# Patient Record
Sex: Male | Born: 1980
Health system: Southern US, Community
[De-identification: ages and names within clinical notes are randomized; demographics above are authoritative.]

## PROBLEM LIST (undated history)

## (undated) DIAGNOSIS — I10 Essential (primary) hypertension: Secondary | ICD-10-CM

---

## 2010-03-21 ENCOUNTER — Emergency Department (HOSPITAL_COMMUNITY)
Admission: EM | Admit: 2010-03-21 | Discharge: 2010-03-21 | Payer: Self-pay | Source: Home / Self Care | Admitting: Emergency Medicine

## 2010-06-11 LAB — URINALYSIS, ROUTINE W REFLEX MICROSCOPIC
Bilirubin Urine: NEGATIVE
Glucose, UA: NEGATIVE mg/dL
Hgb urine dipstick: NEGATIVE
Ketones, ur: NEGATIVE mg/dL
Nitrite: NEGATIVE
Protein, ur: NEGATIVE mg/dL
Specific Gravity, Urine: 1.006 (ref 1.005–1.030)
Urobilinogen, UA: 0.2 mg/dL (ref 0.0–1.0)
pH: 7 (ref 5.0–8.0)

## 2010-06-11 LAB — POCT I-STAT, CHEM 8
BUN: 4 mg/dL — ABNORMAL LOW (ref 6–23)
Calcium, Ion: 1.14 mmol/L (ref 1.12–1.32)
Chloride: 103 mEq/L (ref 96–112)
Creatinine, Ser: 1.1 mg/dL (ref 0.4–1.5)
TCO2: 31 mmol/L (ref 0–100)

## 2015-05-22 ENCOUNTER — Encounter (HOSPITAL_COMMUNITY): Payer: Self-pay | Admitting: Emergency Medicine

## 2015-05-22 ENCOUNTER — Emergency Department (HOSPITAL_COMMUNITY): Payer: BLUE CROSS/BLUE SHIELD

## 2015-05-22 ENCOUNTER — Emergency Department (HOSPITAL_COMMUNITY)
Admission: EM | Admit: 2015-05-22 | Discharge: 2015-05-22 | Disposition: A | Discharge status: Home / Self Care | Payer: BLUE CROSS/BLUE SHIELD | Attending: Emergency Medicine | Admitting: Emergency Medicine

## 2015-05-22 DIAGNOSIS — B349 Viral infection, unspecified: Secondary | ICD-10-CM | POA: Insufficient documentation

## 2015-05-22 DIAGNOSIS — R197 Diarrhea, unspecified: Secondary | ICD-10-CM

## 2015-05-22 DIAGNOSIS — R05 Cough: Secondary | ICD-10-CM | POA: Diagnosis present

## 2015-05-22 DIAGNOSIS — M545 Low back pain: Secondary | ICD-10-CM | POA: Insufficient documentation

## 2015-05-22 DIAGNOSIS — Z79899 Other long term (current) drug therapy: Secondary | ICD-10-CM | POA: Insufficient documentation

## 2015-05-22 DIAGNOSIS — I10 Essential (primary) hypertension: Secondary | ICD-10-CM | POA: Insufficient documentation

## 2015-05-22 DIAGNOSIS — R1110 Vomiting, unspecified: Secondary | ICD-10-CM

## 2015-05-22 HISTORY — DX: Essential (primary) hypertension: I10

## 2015-05-22 LAB — COMPREHENSIVE METABOLIC PANEL
ALBUMIN: 3.9 g/dL (ref 3.5–5.0)
ALK PHOS: 97 U/L (ref 38–126)
ALT: 56 U/L (ref 17–63)
AST: 26 U/L (ref 15–41)
Anion gap: 13 (ref 5–15)
BUN: 12 mg/dL (ref 6–20)
CALCIUM: 9.8 mg/dL (ref 8.9–10.3)
CO2: 23 mmol/L (ref 22–32)
CREATININE: 1.43 mg/dL — AB (ref 0.61–1.24)
Chloride: 102 mmol/L (ref 101–111)
GFR calc Af Amer: >60 mL/min (ref >60)
GFR calc non Af Amer: >60 mL/min (ref >60)
GLUCOSE: 131 mg/dL — AB (ref 65–99)
Potassium: 4.2 mmol/L (ref 3.5–5.1)
SODIUM: 138 mmol/L (ref 135–145)
Total Bilirubin: 0.7 mg/dL (ref 0.3–1.2)
Total Protein: 8.1 g/dL (ref 6.5–8.1)

## 2015-05-22 LAB — CBC
HCT: 42.9 % (ref 39.0–52.0)
HEMOGLOBIN: 14.4 g/dL (ref 13.0–17.0)
MCH: 30.4 pg (ref 26.0–34.0)
MCHC: 33.6 g/dL (ref 30.0–36.0)
MCV: 90.7 fL (ref 78.0–100.0)
Platelets: 332 10*3/uL (ref 150–400)
RBC: 4.73 MIL/uL (ref 4.22–5.81)
RDW: 12.9 % (ref 11.5–15.5)
WBC: 16 10*3/uL — ABNORMAL HIGH (ref 4.0–10.5)

## 2015-05-22 LAB — URINALYSIS, ROUTINE W REFLEX MICROSCOPIC
BILIRUBIN URINE: NEGATIVE
GLUCOSE, UA: NEGATIVE mg/dL
HGB URINE DIPSTICK: NEGATIVE
Ketones, ur: NEGATIVE mg/dL
Leukocytes, UA: NEGATIVE
Nitrite: NEGATIVE
PH: 6 (ref 5.0–8.0)
Protein, ur: NEGATIVE mg/dL
SPECIFIC GRAVITY, URINE: 1.013 (ref 1.005–1.030)

## 2015-05-22 MED ORDER — ACETAMINOPHEN 325 MG PO TABS
650.0000 mg | ORAL_TABLET | Freq: Once | ORAL | Status: AC
  Administered 2015-05-22: 650 mg via ORAL

## 2015-05-22 MED ORDER — SODIUM CHLORIDE 0.9 % IV BOLUS (SEPSIS)
1000.0000 mL | Freq: Once | INTRAVENOUS | Status: AC
  Administered 2015-05-22: 1000 mL via INTRAVENOUS

## 2015-05-22 MED ORDER — ONDANSETRON 4 MG PO TBDP: 4.0000 mg | ORAL_TABLET | Freq: Three times a day (TID) | ORAL | Status: DC | PRN

## 2015-05-22 MED ORDER — ONDANSETRON HCL 4 MG/2ML IJ SOLN
4.0000 mg | Freq: Once | INTRAMUSCULAR | Status: AC
  Administered 2015-05-22: 4 mg via INTRAVENOUS
  Filled 2015-05-22: qty 2

## 2015-05-22 MED ORDER — ACETAMINOPHEN 325 MG PO TABS
ORAL_TABLET | ORAL | Status: AC
  Filled 2015-05-22: qty 2

## 2015-05-22 NOTE — ED Provider Notes (Signed)
CSN: [id number]     Arrival date & time 05/22/15  7829 History   First MD Initiated Contact with Patient 05/22/15 [phone]     Chief Complaint  Patient presents with  . Emesis  . Cough     (Consider location/radiation/quality/duration/timing/severity/associated sxs/prior Treatment) HPI Comments: 35yo M w/ h/o HTN who p/w fever and cough. Patient states that 3 days ago he began having cough associated with runny nose, chills, and subjective fever. He has had 3 episodes of vomiting over the past 24 hours. He denies any diarrhea or abdominal pain. No difficulty breathing. His only complaint of pain is mild low back pain across his lower back. No dysuria or hematuria. His wife is currently sick with a cough. No recent travel. He has been taking Tylenol and Motrin at home for his symptoms.  Patient is a 35 y.o. male presenting with vomiting and cough. The history is provided by the patient.  Emesis Cough   Past Medical History  Diagnosis Date  . Hypertension    History reviewed. No pertinent past surgical history. No family history on file. Social History  Substance Use Topics  . Smoking status: Never Smoker   . Smokeless tobacco: None  . Alcohol Use: No    Review of Systems  Respiratory: Positive for cough.   Gastrointestinal: Positive for vomiting.    10 Systems reviewed and are negative for acute change except as noted in the HPI.  Allergies  Aspirin and Novocain  Home Medications   Prior to Admission medications   Medication Sig Start Date End Date Taking? Authorizing Provider  amLODipine (NORVASC) 5 MG tablet Take 5 mg by mouth daily.   Yes Historical Provider, MD  ondansetron (ZOFRAN ODT) 4 MG disintegrating tablet Take 1 tablet (4 mg total) by mouth every 8 (eight) hours as needed for nausea or vomiting. 05/22/15   Ambrose Finland Edgardo Petrenko, MD   BP 150/101 mmHg  Pulse 94  Temp(Src) 98.6 F (37 C) (Oral)  Resp 18  Ht  (1.651 m)  Wt 159 lb (72.122 kg)  BMI 26.46  kg/m2  SpO2 98% Physical Exam  Constitutional: He is oriented to person, place, and time. He appears well-developed and well-nourished. No distress.  HENT:  Head: Normocephalic and atraumatic.  Mouth/Throat: Oropharynx is clear and moist.  Moist mucous membranes  Eyes: Conjunctivae are normal. Pupils are equal, round, and reactive to light.  Neck: Neck supple.  Cardiovascular: Normal rate, regular rhythm and normal heart sounds.   No murmur heard. Pulmonary/Chest: Effort normal and breath sounds normal.  Abdominal: Soft. Bowel sounds are normal. He exhibits no distension. There is no tenderness.  Musculoskeletal: He exhibits no edema.  Lymphadenopathy:    He has cervical adenopathy.  Neurological: He is alert and oriented to person, place, and time.  Fluent speech  Skin: Skin is warm and dry.  Psychiatric: He has a normal mood and affect. Judgment normal.  Nursing note and vitals reviewed.   ED Course  Procedures (including critical care time) Labs Review Labs Reviewed  COMPREHENSIVE METABOLIC PANEL - Abnormal; Notable for the following:    Glucose, Bld 131 (*)    Creatinine, Ser 1.43 (*)    All other components within normal limits  CBC - Abnormal; Notable for the following:    WBC 16.0 (*)    All other components within normal limits  URINALYSIS, ROUTINE W REFLEX MICROSCOPIC (NOT AT [facility])    Imaging Review Dg Chest 2 View  05/22/2015  CLINICAL DATA:  35 year old male with cough and fever x3 days. EXAM: CHEST  2 VIEW COMPARISON:  None. FINDINGS: Two views of the chest demonstrate minimal bibasilar dependent atelectatic changes. There is no focal consolidation, pleural effusion, or pneumothorax. The cardiac silhouette is within normal limits. The osseous structures appear unremarkable. IMPRESSION: No focal consolidation. Electronically Signed   By: Elgie Collard M.D.   On: 05/22/2015 09:42   I have personally reviewed and evaluated these images and lab results as part of  my medical decision-making.   EKG Interpretation None     Medications  acetaminophen (TYLENOL) tablet 650 mg (650 mg Oral Given 05/22/15 0647)  sodium chloride 0.9 % bolus 1,000 mL (0 mLs Intravenous Stopped 05/22/15 1010)  ondansetron (ZOFRAN) injection 4 mg (4 mg Intravenous Given 05/22/15 0912)    MDM   Final diagnoses:  Viral syndrome  Vomiting and diarrhea   Pt w/ 3 days of viral symptoms including cough, fever/chills, and mild low back pain. He was well-appearing on exam. Initial vital signs notable for fever of 100.8 and heart rate 108. His vital signs improved after receiving Tylenol. Normal work of breathing and no abnormal lung sounds. No abdominal tenderness on exam. Gave the patient Zofran and an IV fluid bolus and obtained above lab work. Creatinine slightly abnormal at 1.43, no baseline available for comparison. WBC 16,000. Chest x-ray clear.  Patient's symptoms consistent with a viral process. Because he is well appearing and tolerating PO, I feel he is safe for discharge. Instructed on supportive care and reviewed return precautions. Patient voiced understanding and was discharged in satisfactory condition.  Laurence Spates, MD 05/22/15 [phone]

## 2015-05-22 NOTE — ED Notes (Signed)
Patient undressed, in gown, on continuous pulse oximetry and blood pressure cuff; visitor at bedside 

## 2015-05-22 NOTE — ED Notes (Signed)
Pt drank juice and was able to keep it down

## 2015-05-22 NOTE — ED Notes (Signed)
MD at the bedside  

## 2015-05-22 NOTE — ED Notes (Signed)
Pt. reports emesis with fever , chills and runny nose onset yesterday .

## 2015-05-22 NOTE — ED Notes (Signed)
Pt given juice and verbalized no other needs at this time

## 2015-05-22 NOTE — ED Notes (Signed)
Pt returned from X-ray.  

## 2015-05-22 NOTE — ED Notes (Signed)
Patient aware of need of urine specimen; patient handed an urinal to use

## 2018-06-05 ENCOUNTER — Encounter (HOSPITAL_COMMUNITY): Payer: Self-pay

## 2018-06-05 ENCOUNTER — Ambulatory Visit (HOSPITAL_COMMUNITY)
Admission: EM | Admit: 2018-06-05 | Discharge: 2018-06-05 | Disposition: A | Discharge status: Other Acute Inpatient Hospital | Payer: Managed Care, Other (non HMO) | Attending: Family Medicine | Admitting: Family Medicine

## 2018-06-05 DIAGNOSIS — R51 Headache: Secondary | ICD-10-CM

## 2018-06-05 DIAGNOSIS — B9789 Other viral agents as the cause of diseases classified elsewhere: Secondary | ICD-10-CM

## 2018-06-05 DIAGNOSIS — R519 Headache, unspecified: Secondary | ICD-10-CM

## 2018-06-05 DIAGNOSIS — I160 Hypertensive urgency: Secondary | ICD-10-CM | POA: Diagnosis not present

## 2018-06-05 DIAGNOSIS — J069 Acute upper respiratory infection, unspecified: Secondary | ICD-10-CM

## 2018-06-05 NOTE — ED Triage Notes (Signed)
Pt presents with cold symptoms; nasal drainage and congestion; pt also has complaints of ongoing headache and elevated blood pressure.

## 2018-06-05 NOTE — ED Provider Notes (Signed)
[facility]   [id number] 06/05/18 Arrival Time: 0932  CC: HEADACHE, elevated blood pressure and cold symptoms  SUBJECTIVE:  John Savage is a 38 y.o. male hx significant for HTN, who complains of intermittent HAs for the past couple of months. Currently with HA.  Denies a precipitating event, or recent head trauma.  Associates HA to elevated blood pressure.  Checked blood pressure at [facility] the other day and was 249/100 and something. 219/133 in office. Hx of HTN x 1 year.  Was started on amlodipine at that time.  Takes medication at night time daily.  Has a PCP that follows him for high blood pressure, but has not reached out to PCP regarding uncontrolled HTN and HA symptoms.  Patient localizes his pain to the top of his head.  Describes the pain as intermittent, lasting 2-3 hours at a time, and "hot inside" in character.  Patient has tried OTC ibuprofen with temporary relief. Denies worsening factors.  Denies similar symptoms in the past.  This is the worst headache of his life.  Complains of intermittent dizziness, currently asymptomatic.  Denies lightheadedness, chest pain, shortness of breath, numbness or tingling in extremities, abdominal pain, changes in bowel or bladder habits.    Pt also mentions cold symptoms including nasal drainage, congestion, and cough x 1 weeks.  Admits to sick exposure to son with similar symptoms, son diagnosed with PNA.  Has OTC medications.  Denies similar symptoms in the past.  Denies fever, chills, body aches, sore throat.    ROS: As per HPI.  Past Medical History:  Diagnosis Date  . Hypertension    History reviewed. No pertinent surgical history. Allergies  Allergen Reactions  . Aspirin     Loose blood  . Novocain [Procaine]     unknown   No current facility-administered medications on file prior to encounter.    Current Outpatient Medications on File Prior to Encounter  Medication Sig Dispense Refill  . amLODipine (NORVASC)  5 MG tablet Take 5 mg by mouth daily.    . ondansetron (ZOFRAN ODT) 4 MG disintegrating tablet Take 1 tablet (4 mg total) by mouth every 8 (eight) hours as needed for nausea or vomiting. 8 tablet 0   Social History   Socioeconomic History  . Marital status: Married    Spouse name: Not on file  . Number of children: Not on file  . Years of education: Not on file  . Highest education level: Not on file  Occupational History  . Not on file  Social Needs  . Financial resource strain: Not on file  . Food insecurity:    Worry: Not on file    Inability: Not on file  . Transportation needs:    Medical: Not on file    Non-medical: Not on file  Tobacco Use  . Smoking status: Never Smoker  Substance and Sexual Activity  . Alcohol use: No  . Drug use: No  . Sexual activity: Not on file  Lifestyle  . Physical activity:    Days per week: Not on file    Minutes per session: Not on file  . Stress: Not on file  Relationships  . Social connections:    Talks on phone: Not on file    Gets together: Not on file    Attends religious service: Not on file    Active member of club or organization: Not on file    Attends meetings of clubs or organizations: Not on file  Relationship status: Not on file  . Intimate partner violence:    Fear of current or ex partner: Not on file    Emotionally abused: Not on file    Physically abused: Not on file    Forced sexual activity: Not on file  Other Topics Concern  . Not on file  Social History Narrative  . Not on file   Family History  Family history unknown: Yes    OBJECTIVE:  Vitals:   06/05/18 0957  BP: (!) 219/133  Pulse: (!) 105  Resp: 20  Temp: 98.9 F (37.2 C)  TempSrc: Oral  SpO2: 99%    General appearance: alert; no distress Eyes: PERRLA; EOMI HENT: normocephalic; atraumatic; EACs clear, TMs pearly gray; Nose patent without rhinorrhea; oropharynx clear Neck: supple with FROM Lungs: clear to auscultation  bilaterally Heart: soft systolic murmur.  Radial pulses 2+ symmetrical bilaterally Extremities: no edema; symmetrical with no gross deformities Skin: warm and dry Abdomen: soft, non-distended, NTTP, no guarding Neurologic: CN 2-12 grossly intact; finger to nose without difficulty; normal gait; strength and sensation intact bilaterally about the upper and lower extremities; left hand with tremor, pt states he has had tremor prior to symptoms Psychological: alert and cooperative; normal mood and affect  ASSESSMENT & PLAN:  1. Hypertensive urgency   2. Worst headache of life   3. Viral URI with cough     Blood pressure significantly elevated in office with worst headache of life.   Recommending further evaluation and management in ED.  PT aware and in agreement with this plan.    Rennis Harding, PA-C 06/05/18 1148

## 2018-06-05 NOTE — Discharge Instructions (Signed)
Blood pressure significantly elevated in office with worst headache of life.   Recommending further evaluation and management in ED.  PT aware and in agreement with this plan

## 2018-06-06 ENCOUNTER — Observation Stay (HOSPITAL_COMMUNITY)
Admission: EM | Admit: 2018-06-06 | Discharge: 2018-06-07 | Disposition: A | Discharge status: Home / Self Care | Payer: Managed Care, Other (non HMO) | Attending: Student in an Organized Health Care Education/Training Program | Admitting: Student in an Organized Health Care Education/Training Program

## 2018-06-06 ENCOUNTER — Other Ambulatory Visit: Payer: Self-pay

## 2018-06-06 ENCOUNTER — Encounter (HOSPITAL_COMMUNITY): Payer: Self-pay

## 2018-06-06 ENCOUNTER — Emergency Department (HOSPITAL_COMMUNITY): Payer: Managed Care, Other (non HMO)

## 2018-06-06 ENCOUNTER — Observation Stay (HOSPITAL_COMMUNITY): Payer: Managed Care, Other (non HMO)

## 2018-06-06 DIAGNOSIS — R51 Headache: Secondary | ICD-10-CM | POA: Insufficient documentation

## 2018-06-06 DIAGNOSIS — B9729 Other coronavirus as the cause of diseases classified elsewhere: Secondary | ICD-10-CM | POA: Insufficient documentation

## 2018-06-06 DIAGNOSIS — I701 Atherosclerosis of renal artery: Secondary | ICD-10-CM | POA: Diagnosis not present

## 2018-06-06 DIAGNOSIS — I160 Hypertensive urgency: Secondary | ICD-10-CM | POA: Diagnosis not present

## 2018-06-06 DIAGNOSIS — I10 Essential (primary) hypertension: Secondary | ICD-10-CM | POA: Diagnosis present

## 2018-06-06 DIAGNOSIS — J069 Acute upper respiratory infection, unspecified: Principal | ICD-10-CM | POA: Insufficient documentation

## 2018-06-06 DIAGNOSIS — Z79899 Other long term (current) drug therapy: Secondary | ICD-10-CM | POA: Insufficient documentation

## 2018-06-06 DIAGNOSIS — R9089 Other abnormal findings on diagnostic imaging of central nervous system: Secondary | ICD-10-CM | POA: Diagnosis present

## 2018-06-06 DIAGNOSIS — R519 Headache, unspecified: Secondary | ICD-10-CM | POA: Diagnosis present

## 2018-06-06 DIAGNOSIS — Z886 Allergy status to analgesic agent status: Secondary | ICD-10-CM | POA: Diagnosis not present

## 2018-06-06 DIAGNOSIS — R930 Abnormal findings on diagnostic imaging of skull and head, not elsewhere classified: Secondary | ICD-10-CM

## 2018-06-06 DIAGNOSIS — E876 Hypokalemia: Secondary | ICD-10-CM

## 2018-06-06 LAB — CBC
HCT: 41.5 % (ref 39.0–52.0)
Hemoglobin: 13.8 g/dL (ref 13.0–17.0)
MCH: 30.1 pg (ref 26.0–34.0)
MCHC: 33.3 g/dL (ref 30.0–36.0)
MCV: 90.6 fL (ref 80.0–100.0)
Platelets: 302 10*3/uL (ref 150–400)
RBC: 4.58 MIL/uL (ref 4.22–5.81)
RDW: 12.9 % (ref 11.5–15.5)
WBC: 6.9 10*3/uL (ref 4.0–10.5)
nRBC: 0 % (ref 0.0–0.2)

## 2018-06-06 LAB — BASIC METABOLIC PANEL
Anion gap: 11 (ref 5–15)
BUN: 12 mg/dL (ref 6–20)
CHLORIDE: 105 mmol/L (ref 98–111)
CO2: 25 mmol/L (ref 22–32)
CREATININE: 1.32 mg/dL — AB (ref 0.61–1.24)
Calcium: 9.3 mg/dL (ref 8.9–10.3)
GFR calc Af Amer: >60 mL/min (ref >60)
GFR calc non Af Amer: >60 mL/min (ref >60)
GLUCOSE: 127 mg/dL — AB (ref 70–99)
POTASSIUM: 3.1 mmol/L — AB (ref 3.5–5.1)
Sodium: 141 mmol/L (ref 135–145)

## 2018-06-06 LAB — MRSA PCR SCREENING: MRSA by PCR: NEGATIVE

## 2018-06-06 MED ORDER — LABETALOL HCL 5 MG/ML IV SOLN
40.0000 mg | Freq: Once | INTRAVENOUS | Status: AC
  Administered 2018-06-06: 40 mg via INTRAVENOUS
  Filled 2018-06-06: qty 8

## 2018-06-06 MED ORDER — ENOXAPARIN SODIUM 40 MG/0.4ML ~~LOC~~ SOLN
40.0000 mg | SUBCUTANEOUS | Status: DC
  Administered 2018-06-06: 40 mg via SUBCUTANEOUS
  Filled 2018-06-06: qty 0.4

## 2018-06-06 MED ORDER — ACETAMINOPHEN 650 MG RE SUPP: 650.0000 mg | Freq: Four times a day (QID) | RECTAL | Status: DC | PRN

## 2018-06-06 MED ORDER — ACETAMINOPHEN 325 MG PO TABS
650.0000 mg | ORAL_TABLET | Freq: Four times a day (QID) | ORAL | Status: DC | PRN
  Administered 2018-06-06 – 2018-06-07 (×2): 650 mg via ORAL
  Filled 2018-06-06 (×2): qty 2

## 2018-06-06 MED ORDER — POTASSIUM CHLORIDE CRYS ER 20 MEQ PO TBCR
40.0000 meq | EXTENDED_RELEASE_TABLET | Freq: Once | ORAL | Status: AC
  Administered 2018-06-06: 40 meq via ORAL
  Filled 2018-06-06: qty 2

## 2018-06-06 MED ORDER — SODIUM CHLORIDE 0.9% FLUSH: 3.0000 mL | Freq: Once | INTRAVENOUS | Status: DC

## 2018-06-06 MED ORDER — LABETALOL HCL 5 MG/ML IV SOLN
20.0000 mg | Freq: Once | INTRAVENOUS | Status: AC
  Administered 2018-06-06: 20 mg via INTRAVENOUS
  Filled 2018-06-06: qty 4

## 2018-06-06 MED ORDER — LISINOPRIL 20 MG PO TABS
20.0000 mg | ORAL_TABLET | Freq: Every day | ORAL | Status: DC
  Administered 2018-06-06 – 2018-06-07 (×2): 20 mg via ORAL
  Filled 2018-06-06 (×2): qty 1

## 2018-06-06 MED ORDER — HYDROCHLOROTHIAZIDE 25 MG PO TABS
25.0000 mg | ORAL_TABLET | Freq: Every day | ORAL | Status: DC
  Administered 2018-06-06 – 2018-06-07 (×2): 25 mg via ORAL
  Filled 2018-06-06 (×2): qty 1

## 2018-06-06 MED ORDER — SENNOSIDES-DOCUSATE SODIUM 8.6-50 MG PO TABS: 1.0000 | ORAL_TABLET | Freq: Every evening | ORAL | Status: DC | PRN

## 2018-06-06 NOTE — H&P (Signed)
Date: 06/06/2018               Patient Name:  John Savage MRN: [id number]  DOB: 11-Dec-1980 Age / Sex: 38 y.o., male   PCP: Patient, No Pcp Per         Medical Service: Internal Medicine Teaching Service         Attending Physician: Dr. Cathren Laine, MD    First Contact: Dr. Jaynie Bream Pager: [phone]  Second Contact: Dr. Lanelle Bal Pager: [phone]       After Hours (After 5p/  First Contact Pager: [phone]  weekends / holidays): Second Contact Pager: [phone]   Chief Complaint: headache and cough  History of Present Illness: John Savage is a 38 year old male with a history of hypertension who presents with cough and headache.  He states that he was in his usual state of health up until 2 weeks ago when he began to have a nonproductive cough, congestion, and headache.  He describes the headache as localized to the top of his head.  His pain is intermittent lasting 2 to 3 hours at a time.  He has tried over-the-counter ibuprofen with some relief of his symptoms. He currently has a dull headache. He denies decongestant use.  He has never had any of these symptoms in the past.  His only other symptom currently is shaking of both hands bilaterally.  He reports that this is a chronic issue that has been going on for several years.  He denies lightheadedness, dizziness, chest pain, shortness of breath, weakness, numbness or tingling sensation, nausea, abdominal pain, changes in bowel movements, or urinary symptoms.  He denies fevers, body aches, and sore throat.  Of note his son was diagnosed with pneumonia recently.  He was seen in the [facility] urgent care yesterday for upper respiratory symptoms and headache. He was found to have an elevated blood pressure at 219/133 and was recommended to go to the emergency department for further evaluation and management of hypertensive urgency.  He reports a one-year history of hypertension.  He is seen by [facility] in  [place] and was prescribed amlodipine which he reports taking twice daily.  His medication list however shows only amlodipine 5 mg once a day.  He does report missing doses frequently but did take it this morning.  He initially stated that he had not seen his primary care doctor over the past year because of insurance issues but later reported having his blood pressure checked in the clinic. He describes his BP being "over 240" at that time.   In the ED, he was found to have blood pressures with SBP of 200-235 and DBP of 130-150. He was given two doses of IV labetalol at 20 mg and 40 mg. His blood pressure decreased to 173/133 but has subsequently increased to 190-200/140's.   Meds:  Current Meds  Medication Sig  . amLODipine (NORVASC) 5 MG tablet Take 5 mg by mouth daily.  Marland Kitchen ibuprofen (ADVIL,MOTRIN) 200 MG tablet Take 200 mg by mouth 2 (two) times daily as needed for headache.  . Pseudoeph-Doxylamine-DM-APAP (NYQUIL PO) Take 20 mLs by mouth 2 (two) times daily as needed (cough).     Allergies: Allergies as of 06/06/2018 - Review Complete 06/06/2018  Allergen Reaction Noted  . Aspirin Other (See Comments) 05/22/2015  . Novocain [procaine] Other (See Comments) 05/22/2015   Past Medical History:  Diagnosis Date  . Hypertension     Family History: No significant family  history.  Social History: He lives with his wife and children. He worked at a call center. He denies alcohol, tobacco, or drug use.   Review of Systems: A complete ROS was negative except as per HPI.   Physical Exam: Blood pressure (!) 190/130, pulse (!) 111, temperature 98.1 F (36.7 C), temperature source Oral, resp. rate (!) 23, SpO2 96 %. Physical Exam Vitals signs and nursing note reviewed.  Constitutional:      Appearance: Normal appearance.  HENT:     Head: Normocephalic and atraumatic.     Comments: No scalp or facial tenderness.     Nose: Nose normal. No congestion or rhinorrhea.     Mouth/Throat:      Mouth: Mucous membranes are moist.     Pharynx: Oropharynx is clear.  Cardiovascular:     Rate and Rhythm: Normal rate and regular rhythm.     Pulses: Normal pulses.     Heart sounds: Normal heart sounds.  Pulmonary:     Effort: Pulmonary effort is normal. No respiratory distress.     Breath sounds: Normal breath sounds.  Neurological:     General: No focal deficit present.     Mental Status: He is alert and oriented to person, place, and time. Mental status is at baseline.     Cranial Nerves: No cranial nerve deficit.     Sensory: No sensory deficit.     Motor: No weakness.     Coordination: Coordination normal.  Psychiatric:        Mood and Affect: Mood normal.        Behavior: Behavior normal.    Labs: CBC unremarkable.  BMP significant for hypokalemia of 3.1, elevated creatinine of 1.32 (seems to be at baseline--1.43 three years ago).    EKG: personally reviewed my interpretation is left axis deviation consistent with left ventricular hypertrophy, normal intervals, no signs of acute ischemia  CXR: personally reviewed my interpretation is mild peribronchial wall thickening but otherwise unremarkable.  Head CT: IMPRESSION: 1. Subcortical white matter changes/hypoattenuation best seen in the frontal lobes toward the vertex are nonspecific. Recommend an MRI for further assessment. 2. Mild mucosal thickening in scattered ethmoid air cells. 3. No other acute abnormalities.  MRI: IMPRESSION: 1. Periventricular T2 hyperintensities are moderately advanced for age. The finding is nonspecific but can be seen in the setting of chronic microvascular ischemia, a demyelinating process such as multiple sclerosis, vasculitis, complicated migraine headaches, or as the sequelae of a prior infectious or inflammatory process. Given the patient's severe hypertension, this most likely represents small vessel disease. 2. No acute intracranial abnormality.  Assessment & Plan by  Problem: Active Problems:   Hypertensive urgency  John Savage presents with a nonproductive cough, congestion, headache, and was found to have elevated blood pressure. I believe that his symptoms are likely due to an upper respiratory infection. His blood pressure has likely been uncontrolled for the last several months (reports elevated BP in January) but there is no sign of acute end-organ damage--signifying hypertensive urgency. He was found to have periventricular hyperintensities on MRI which likely represents chronic small vessel disease.   Hypertensive Urgency: - Hold home amlodipine - Start HCTZ 25 mg QD - Start Lisinopril 20 mg QD - Continuous cardiac monitorin  Upper Respiratory Infection: Likely viral in nature. - Acetaminophen 650 mg qh6 PRN pain  Hypokalemia - Potassium chloride 40 mEq once  FEN/GI: Heart healthy diet DVT PPX: Enoxaparin subcu Code: Full  Dispo: Admit patient to Observation with expected  length of stay less than 2 midnights.  Signed: Synetta Shadow, MD 06/06/2018, 6:52 PM

## 2018-06-06 NOTE — ED Provider Notes (Signed)
[facility] EMERGENCY DEPARTMENT Provider Note   CSN: [id number] Arrival date & time: 06/06/18  1508    History   Chief Complaint Chief Complaint  Patient presents with  . Cough  . Hypertension    HPI John Savage is a 38 y.o. male.     Patient with hx htn, c/o non prod cough for past 2 weeks, and persistent/recurrent headaches for 2 months. Symptoms moderate, persistent, recurrent. No acute/abrupt change or worsening today. Went to urgent care yesterday, was told bp very high and to go to ER. Patient indicates compliant w bp meds, and indicates took his amlodipine this AM. Denies change in speech or vision. No numbness/weakness, or loss or normal functional ability. Denies chest pain or discomfort. No sob or unusual doe. +nonprod cough. +runny nose. No sore throat. No fever or chills. No night sweats or wt loss. No swelling. No nvd.   The history is provided by the patient.  Cough  Associated symptoms: headaches and rhinorrhea   Associated symptoms: no chest pain, no fever, no rash, no shortness of breath and no sore throat   Hypertension  Associated symptoms include headaches. Pertinent negatives include no chest pain, no abdominal pain and no shortness of breath.    Past Medical History:  Diagnosis Date  . Hypertension     There are no active problems to display for this patient.   History reviewed. No pertinent surgical history.      Home Medications    Prior to Admission medications   Medication Sig Start Date End Date Taking? Authorizing Provider  amLODipine (NORVASC) 5 MG tablet Take 5 mg by mouth daily.    [provider]  ondansetron (ZOFRAN ODT) 4 MG disintegrating tablet Take 1 tablet (4 mg total) by mouth every 8 (eight) hours as needed for nausea or vomiting. 05/22/15   Little, Ambrose Finland, MD    Family History Family History  Family history unknown: Yes    Social History Social History   Tobacco Use  .  Smoking status: Never Smoker  . Smokeless tobacco: Never Used  Substance Use Topics  . Alcohol use: No  . Drug use: No     Allergies   Aspirin and Novocain [procaine]   Review of Systems Review of Systems  Constitutional: Negative for fever.  HENT: Positive for rhinorrhea. Negative for sore throat.   Eyes: Negative for pain, redness and visual disturbance.  Respiratory: Positive for cough. Negative for shortness of breath.   Cardiovascular: Negative for chest pain, palpitations and leg swelling.  Gastrointestinal: Negative for abdominal pain, diarrhea and vomiting.  Endocrine: Negative for polyuria.  Genitourinary: Negative for dysuria and flank pain.  Musculoskeletal: Negative for back pain, neck pain and neck stiffness.  Skin: Negative for rash.  Neurological: Positive for headaches. Negative for syncope, speech difficulty, weakness and numbness.  Hematological: Does not bruise/bleed easily.  Psychiatric/Behavioral: Negative for confusion.     Physical Exam Updated Vital Signs BP (!) 223/136 (BP Location: Left Arm)   Pulse 100   Temp 98.1 F (36.7 C) (Oral)   Resp 16   SpO2 96%   Physical Exam Vitals signs and nursing note reviewed.  Constitutional:      Appearance: Normal appearance. He is well-developed.  HENT:     Head: Atraumatic.     Comments: No sinus or temporal tenderness.    Nose: Nose normal.     Mouth/Throat:     Mouth: Mucous membranes are moist.  Pharynx: Oropharynx is clear.  Eyes:     General: No scleral icterus.    Conjunctiva/sclera: Conjunctivae normal.     Pupils: Pupils are equal, round, and reactive to light.  Neck:     Musculoskeletal: Normal range of motion and neck supple. No neck rigidity.     Vascular: No carotid bruit.     Trachea: No tracheal deviation.     Comments: No stiffness or rigidity. Trachea midline. No tm.  Cardiovascular:     Rate and Rhythm: Normal rate and regular rhythm.     Pulses: Normal pulses.     Heart  sounds: Normal heart sounds. No murmur. No friction rub. No gallop.   Pulmonary:     Effort: Pulmonary effort is normal. No accessory muscle usage or respiratory distress.     Breath sounds: Normal breath sounds.  Abdominal:     General: Bowel sounds are normal. There is no distension.     Palpations: Abdomen is soft. There is no mass.     Tenderness: There is no abdominal tenderness. There is no guarding.     Comments: No bruits.   Genitourinary:    Comments: No cva tenderness. Musculoskeletal:        General: No swelling or tenderness.     Right lower leg: No edema.     Left lower leg: No edema.  Skin:    General: Skin is warm and dry.     Findings: No rash.  Neurological:     Mental Status: He is alert.     Comments: Alert, speech clear. Motor intact bil, stre 5/5. sens grossly intact bil. Steady gait.   Psychiatric:        Mood and Affect: Mood normal.      ED Treatments / Results  Labs (all labs ordered are listed, but only abnormal results are displayed) Results for orders placed or performed during the hospital encounter of 06/06/18  Basic metabolic panel  Result Value Ref Range   Sodium 141 135 - 145 mmol/L   Potassium 3.1 (L) 3.5 - 5.1 mmol/L   Chloride 105 98 - 111 mmol/L   CO2 25 22 - 32 mmol/L   Glucose, Bld 127 (H) 70 - 99 mg/dL   BUN 12 6 - 20 mg/dL   Creatinine, Ser 4.27 (H) 0.61 - 1.24 mg/dL   Calcium 9.3 8.9 - 06.2 mg/dL   GFR calc non Af Amer >60 >60 mL/min   GFR calc Af Amer >60 >60 mL/min   Anion gap 11 5 - 15  CBC  Result Value Ref Range   WBC 6.9 4.0 - 10.5 K/uL   RBC 4.58 4.22 - 5.81 MIL/uL   Hemoglobin 13.8 13.0 - 17.0 g/dL   HCT 37.6 28.3 - 15.1 %   MCV 90.6 80.0 - 100.0 fL   MCH 30.1 26.0 - 34.0 pg   MCHC 33.3 30.0 - 36.0 g/dL   RDW 76.1 60.7 - 37.1 %   Platelets 302 150 - 400 K/uL   nRBC 0.0 0.0 - 0.2 %    EKG EKG Interpretation  Date/Time:  Saturday June 06 2018 15:28:38 EST Ventricular Rate:  99 PR Interval:  146 QRS  Duration: 72 QT Interval:  386 QTC Calculation: 495 R Axis:   27 Text Interpretation:  Normal sinus rhythm Left ventricular hypertrophy Possible Inferior infarct , age undetermined ST & T wave abnormality, consider anterolateral ischemia Abnormal ECG Suspect LVH but TWI also concerning for ischemia No ST elevations noted No  old tracing to compare Confirmed by Shaune Pollack [phone]) on 06/06/2018 3:34:10 PM   Radiology Ct Head Wo Contrast  Result Date: 06/06/2018 CLINICAL DATA:  Headache, cough, and runny nose. Elevated blood pressure. EXAM: CT HEAD WITHOUT CONTRAST TECHNIQUE: Contiguous axial images were obtained from the base of the skull through the vertex without intravenous contrast. COMPARISON:  None. FINDINGS: Brain: No subdural, epidural, or subarachnoid hemorrhage. No acute cortical ischemia or infarct. No mass effect or midline shift. Subcortical white matter changes/hypoattenuation in the frontal lobes towards the vertex. Ventricles and sulci are unremarkable. Cerebellum brainstem, and basal cisterns are normal. Vascular: No hyperdense vessel or unexpected calcification. Skull: Normal. Negative for fracture or focal lesion. Sinuses/Orbits: Mild mucosal thickening in scattered ethmoid air cells. Paranasal sinuses, mastoid air cells, and middle ears are otherwise normal. Other: None. IMPRESSION: 1. Subcortical white matter changes/hypoattenuation best seen in the frontal lobes toward the vertex are nonspecific. Recommend an MRI for further assessment. 2. Mild mucosal thickening in scattered ethmoid air cells. 3. No other acute abnormalities. Electronically Signed   By: Gerome Sam III M.D   On: 06/06/2018 18:00    Procedures Procedures (including critical care time)  Medications Ordered in ED Medications  sodium chloride flush (NS) 0.9 % injection 3 mL (has no administration in time range)     Initial Impression / Assessment and Plan / ED Course  I have reviewed the triage vital  signs and the nursing notes.  Pertinent labs & imaging results that were available during my care of the patient were reviewed by me and considered in my medical decision making (see chart for details).  Iv ns. Labs sent. Imaging ordered.   Reviewed nursing notes and prior charts for additional history.   Labs reviewed - renal fxn c/w baseline, k normal.   Recheck bp - remains high. Labetalol iv.   CT reviewed - no acute hem.  See radiology interpret - may need non emergent mri as f/u.   bp sl improved. Will given additional dose.   Given severe, uncontrolled htn, will admit.   Medical team consulted for admission.     Final Clinical Impressions(s) / ED Diagnoses   Final diagnoses:  None    ED Discharge Orders    None       Cathren Laine, MD 06/06/18 [phone]

## 2018-06-06 NOTE — ED Notes (Signed)
Pt transported to CT ?

## 2018-06-06 NOTE — ED Triage Notes (Signed)
Pt states he has had headache, cough, runny nose. States was sent here from [facility]. Elevated BP has been taking nyquil.

## 2018-06-06 NOTE — ED Notes (Signed)
Patient currently at MRI.  Report given to the floor.  To take up when he arrives back.

## 2018-06-07 ENCOUNTER — Observation Stay (HOSPITAL_BASED_OUTPATIENT_CLINIC_OR_DEPARTMENT_OTHER): Payer: Managed Care, Other (non HMO)

## 2018-06-07 ENCOUNTER — Other Ambulatory Visit: Payer: Self-pay

## 2018-06-07 DIAGNOSIS — Z884 Allergy status to anesthetic agent status: Secondary | ICD-10-CM

## 2018-06-07 DIAGNOSIS — R51 Headache: Secondary | ICD-10-CM

## 2018-06-07 DIAGNOSIS — J069 Acute upper respiratory infection, unspecified: Secondary | ICD-10-CM | POA: Diagnosis not present

## 2018-06-07 DIAGNOSIS — I160 Hypertensive urgency: Secondary | ICD-10-CM

## 2018-06-07 DIAGNOSIS — Z79899 Other long term (current) drug therapy: Secondary | ICD-10-CM

## 2018-06-07 DIAGNOSIS — I10 Essential (primary) hypertension: Secondary | ICD-10-CM

## 2018-06-07 DIAGNOSIS — R9089 Other abnormal findings on diagnostic imaging of central nervous system: Secondary | ICD-10-CM | POA: Diagnosis present

## 2018-06-07 DIAGNOSIS — Z886 Allergy status to analgesic agent status: Secondary | ICD-10-CM | POA: Diagnosis not present

## 2018-06-07 DIAGNOSIS — R519 Headache, unspecified: Secondary | ICD-10-CM | POA: Diagnosis present

## 2018-06-07 LAB — HIV ANTIBODY (ROUTINE TESTING W REFLEX): HIV SCREEN 4TH GENERATION: NONREACTIVE

## 2018-06-07 LAB — RESPIRATORY PANEL BY PCR
Adenovirus: NOT DETECTED
Bordetella pertussis: NOT DETECTED
CORONAVIRUS OC43-RVPPCR: NOT DETECTED
Chlamydophila pneumoniae: NOT DETECTED
Coronavirus 229E: NOT DETECTED
Coronavirus HKU1: NOT DETECTED
Coronavirus NL63: DETECTED — AB
Influenza A: NOT DETECTED
Influenza B: NOT DETECTED
Metapneumovirus: NOT DETECTED
Mycoplasma pneumoniae: NOT DETECTED
PARAINFLUENZA VIRUS 2-RVPPCR: NOT DETECTED
Parainfluenza Virus 1: NOT DETECTED
Parainfluenza Virus 3: NOT DETECTED
Parainfluenza Virus 4: NOT DETECTED
Respiratory Syncytial Virus: NOT DETECTED
Rhinovirus / Enterovirus: NOT DETECTED

## 2018-06-07 MED ORDER — HYDROCHLOROTHIAZIDE 25 MG PO TABS: 25.0000 mg | ORAL_TABLET | Freq: Every day | ORAL | 2 refills | Status: DC

## 2018-06-07 MED ORDER — LISINOPRIL 20 MG PO TABS: 20.0000 mg | ORAL_TABLET | Freq: Every day | ORAL | 2 refills | Status: DC

## 2018-06-07 NOTE — Plan of Care (Signed)
  Problem: Education: Goal: Knowledge of General Education information will improve Description Including pain rating scale, medication(s)/side effects and non-pharmacologic comfort measures 06/07/2018 1526 by Don Perking, RN Outcome: Completed/Met 06/07/2018 1057 by Don Perking, RN Outcome: Progressing   Problem: Health Behavior/Discharge Planning: Goal: Ability to manage health-related needs will improve 06/07/2018 1526 by Don Perking, RN Outcome: Completed/Met 06/07/2018 1057 by Don Perking, RN Outcome: Progressing   Problem: Clinical Measurements: Goal: Ability to maintain clinical measurements within normal limits will improve 06/07/2018 1526 by Don Perking, RN Outcome: Completed/Met 06/07/2018 1057 by Don Perking, RN Outcome: Progressing Goal: Will remain free from infection 06/07/2018 1526 by Don Perking, RN Outcome: Completed/Met 06/07/2018 1057 by Don Perking, RN Outcome: Progressing Goal: Diagnostic test results will improve 06/07/2018 1526 by Don Perking, RN Outcome: Completed/Met 06/07/2018 1057 by Don Perking, RN Outcome: Progressing Goal: Respiratory complications will improve 06/07/2018 1526 by Don Perking, RN Outcome: Completed/Met 06/07/2018 1057 by Don Perking, RN Outcome: Progressing Goal: Cardiovascular complication will be avoided 06/07/2018 1526 by Don Perking, RN Outcome: Completed/Met 06/07/2018 1057 by Don Perking, RN Outcome: Progressing   Problem: Activity: Goal: Risk for activity intolerance will decrease 06/07/2018 1526 by Don Perking, RN Outcome: Completed/Met 06/07/2018 1057 by Don Perking, RN Outcome: Progressing   Problem: Nutrition: Goal: Adequate nutrition will be maintained 06/07/2018 1526 by Don Perking, RN Outcome: Completed/Met 06/07/2018 1057 by Don Perking, RN Outcome: Progressing   Problem: Coping: Goal: Level of anxiety will  decrease 06/07/2018 1526 by Don Perking, RN Outcome: Completed/Met 06/07/2018 1057 by Don Perking, RN Outcome: Progressing   Problem: Elimination: Goal: Will not experience complications related to bowel motility 06/07/2018 1526 by Don Perking, RN Outcome: Completed/Met 06/07/2018 1057 by Don Perking, RN Outcome: Progressing Goal: Will not experience complications related to urinary retention 06/07/2018 1526 by Don Perking, RN Outcome: Completed/Met 06/07/2018 1057 by Don Perking, RN Outcome: Progressing   Problem: Pain Managment: Goal: General experience of comfort will improve 06/07/2018 1526 by Don Perking, RN Outcome: Completed/Met 06/07/2018 1057 by Don Perking, RN Outcome: Progressing   Problem: Safety: Goal: Ability to remain free from injury will improve 06/07/2018 1526 by Don Perking, RN Outcome: Completed/Met 06/07/2018 1057 by Don Perking, RN Outcome: Progressing   Problem: Skin Integrity: Goal: Risk for impaired skin integrity will decrease 06/07/2018 1526 by Don Perking, RN Outcome: Completed/Met 06/07/2018 1057 by Don Perking, RN Outcome: Progressing

## 2018-06-07 NOTE — Progress Notes (Signed)
Notified MD oncall regarding BP, no new orders rec'd will continue to monitor.

## 2018-06-07 NOTE — Discharge Instructions (Signed)
Hypertension Hypertension, commonly called high blood pressure, is when the force of blood pumping through the arteries is too strong. The arteries are the blood vessels that carry blood from the heart throughout the body. Hypertension forces the heart to work harder to pump blood and may cause arteries to become narrow or stiff. Having untreated or uncontrolled hypertension can cause heart attacks, strokes, kidney disease, and other problems. A blood pressure reading consists of a higher number over a lower number. Ideally, your blood pressure should be below 120/80. The first ("top") number is called the systolic pressure. It is a measure of the pressure in your arteries as your heart beats. The second ("bottom") number is called the diastolic pressure. It is a measure of the pressure in your arteries as the heart relaxes. What are the causes? The cause of this condition is not known. What increases the risk? Some risk factors for high blood pressure are under your control. Others are not. Factors you can change  Smoking.  Having type 2 diabetes mellitus, high cholesterol, or both.  Not getting enough exercise or physical activity.  Being overweight.  Having too much fat, sugar, calories, or salt (sodium) in your diet.  Drinking too much alcohol. Factors that are difficult or impossible to change  Having chronic kidney disease.  Having a family history of high blood pressure.  Age. Risk increases with age.  Race. You may be at higher risk if you are African-American.  Gender. Men are at higher risk than women before age 53. After age 65, women are at higher risk than men.  Having obstructive sleep apnea.  Stress. What are the signs or symptoms? Extremely high blood pressure (hypertensive crisis) may cause:  Headache.  Anxiety.  Shortness of breath.  Nosebleed.  Nausea and vomiting.  Severe chest pain.  Jerky movements you cannot control (seizures). How is this  diagnosed? This condition is diagnosed by measuring your blood pressure while you are seated, with your arm resting on a surface. The cuff of the blood pressure monitor will be placed directly against the skin of your upper arm at the level of your heart. It should be measured at least twice using the same arm. Certain conditions can cause a difference in blood pressure between your right and left arms. Certain factors can cause blood pressure readings to be lower or higher than normal (elevated) for a short period of time:  When your blood pressure is higher when you are in a health care provider's office than when you are at home, this is called white coat hypertension. Most people with this condition do not need medicines.  When your blood pressure is higher at home than when you are in a health care provider's office, this is called masked hypertension. Most people with this condition may need medicines to control blood pressure. If you have a high blood pressure reading during one visit or you have normal blood pressure with other risk factors:  You may be asked to return on a different day to have your blood pressure checked again.  You may be asked to monitor your blood pressure at home for 1 week or longer. If you are diagnosed with hypertension, you may have other blood or imaging tests to help your health care provider understand your overall risk for other conditions. How is this treated? This condition is treated by making healthy lifestyle changes, such as eating healthy foods, exercising more, and reducing your alcohol intake. Your health care provider  may prescribe medicine if lifestyle changes are not enough to get your blood pressure under control, and if:  Your systolic blood pressure is above 130.  Your diastolic blood pressure is above 80. Your personal target blood pressure may vary depending on your medical conditions, your age, and other factors. Follow these instructions  at home: Eating and drinking   Eat a diet that is high in fiber and potassium, and low in sodium, added sugar, and fat. An example eating plan is called the DASH (Dietary Approaches to Stop Hypertension) diet. To eat this way: ? Eat plenty of fresh fruits and vegetables. Try to fill half of your plate at each meal with fruits and vegetables. ? Eat whole grains, such as whole wheat pasta, brown rice, or whole grain bread. Fill about one quarter of your plate with whole grains. ? Eat or drink low-fat dairy products, such as skim milk or low-fat yogurt. ? Avoid fatty cuts of meat, processed or cured meats, and poultry with skin. Fill about one quarter of your plate with lean proteins, such as fish, chicken without skin, beans, eggs, and tofu. ? Avoid premade and processed foods. These tend to be higher in sodium, added sugar, and fat.  Reduce your daily sodium intake. Most people with hypertension should eat less than 1,500 mg of sodium a day.  Limit alcohol intake to no more than 1 drink a day for nonpregnant women and 2 drinks a day for men. One drink equals 12 oz of beer, 5 oz of wine, or 1 oz of hard liquor. Lifestyle   Work with your health care provider to maintain a healthy body weight or to lose weight. Ask what an ideal weight is for you.  Get at least 30 minutes of exercise that causes your heart to beat faster (aerobic exercise) most days of the week. Activities may include walking, swimming, or biking.  Include exercise to strengthen your muscles (resistance exercise), such as pilates or lifting weights, as part of your weekly exercise routine. Try to do these types of exercises for 30 minutes at least 3 days a week.  Do not use any products that contain nicotine or tobacco, such as cigarettes and e-cigarettes. If you need help quitting, ask your health care provider.  Monitor your blood pressure at home as told by your health care provider.  Keep all follow-up visits as told by  your health care provider. This is important. Medicines  Take over-the-counter and prescription medicines only as told by your health care provider. Follow directions carefully. Blood pressure medicines must be taken as prescribed.  Do not skip doses of blood pressure medicine. Doing this puts you at risk for problems and can make the medicine less effective.  Ask your health care provider about side effects or reactions to medicines that you should watch for. Contact a health care provider if:  You think you are having a reaction to a medicine you are taking.  You have headaches that keep coming back (recurring).  You feel dizzy.  You have swelling in your ankles.  You have trouble with your vision. Get help right away if:  You develop a severe headache or confusion.  You have unusual weakness or numbness.  You feel faint.  You have severe pain in your chest or abdomen.  You vomit repeatedly.  You have trouble breathing. Summary  Hypertension is when the force of blood pumping through your arteries is too strong. If this condition is not controlled, it  may put you at risk for serious complications.  Your personal target blood pressure may vary depending on your medical conditions, your age, and other factors. For most people, a normal blood pressure is less than 120/80.  Hypertension is treated with lifestyle changes, medicines, or a combination of both. Lifestyle changes include weight loss, eating a healthy, low-sodium diet, exercising more, and limiting alcohol. This information is not intended to replace advice given to you by your health care provider. Make sure you discuss any questions you have with your health care provider. Document Released: 03/18/2005 Document Revised: 02/14/2016 Document Reviewed: 02/14/2016 Elsevier Interactive Patient Education  2019 [employer].   Managing Your Hypertension Hypertension is commonly called high blood pressure. This is  when the force of your blood pressing against the walls of your arteries is too strong. Arteries are blood vessels that carry blood from your heart throughout your body. Hypertension forces the heart to work harder to pump blood, and may cause the arteries to become narrow or stiff. Having untreated or uncontrolled hypertension can cause heart attack, stroke, kidney disease, and other problems. What are blood pressure readings? A blood pressure reading consists of a higher number over a lower number. Ideally, your blood pressure should be below 120/80. The first ("top") number is called the systolic pressure. It is a measure of the pressure in your arteries as your heart beats. The second ("bottom") number is called the diastolic pressure. It is a measure of the pressure in your arteries as the heart relaxes. What does my blood pressure reading mean? Blood pressure is classified into four stages. Based on your blood pressure reading, your health care provider may use the following stages to determine what type of treatment you need, if any. Systolic pressure and diastolic pressure are measured in a unit called mm Hg. Normal  Systolic pressure: below 120.  Diastolic pressure: below 80. Elevated  Systolic pressure: 120-129.  Diastolic pressure: below 80. Hypertension stage 1  Systolic pressure: 130-139.  Diastolic pressure: 80-89. Hypertension stage 2  Systolic pressure: 140 or above.  Diastolic pressure: 90 or above. What health risks are associated with hypertension? Managing your hypertension is an important responsibility. Uncontrolled hypertension can lead to:  A heart attack.  A stroke.  A weakened blood vessel (aneurysm).  Heart failure.  Kidney damage.  Eye damage.  Metabolic syndrome.  Memory and concentration problems. What changes can I make to manage my hypertension? Hypertension can be managed by making lifestyle changes and possibly by taking medicines. Your  health care provider will help you make a plan to bring your blood pressure within a normal range. Eating and drinking   Eat a diet that is high in fiber and potassium, and low in salt (sodium), added sugar, and fat. An example eating plan is called the DASH (Dietary Approaches to Stop Hypertension) diet. To eat this way: ? Eat plenty of fresh fruits and vegetables. Try to fill half of your plate at each meal with fruits and vegetables. ? Eat whole grains, such as whole wheat pasta, brown rice, or whole grain bread. Fill about one quarter of your plate with whole grains. ? Eat low-fat diary products. ? Avoid fatty cuts of meat, processed or cured meats, and poultry with skin. Fill about one quarter of your plate with lean proteins such as fish, chicken without skin, beans, eggs, and tofu. ? Avoid premade and processed foods. These tend to be higher in sodium, added sugar, and fat.  Reduce your  daily sodium intake. Most people with hypertension should eat less than 1,500 mg of sodium a day.  Limit alcohol intake to no more than 1 drink a day for nonpregnant women and 2 drinks a day for men. One drink equals 12 oz of beer, 5 oz of wine, or 1 oz of hard liquor. Lifestyle  Work with your health care provider to maintain a healthy body weight, or to lose weight. Ask what an ideal weight is for you.  Get at least 30 minutes of exercise that causes your heart to beat faster (aerobic exercise) most days of the week. Activities may include walking, swimming, or biking.  Include exercise to strengthen your muscles (resistance exercise), such as weight lifting, as part of your weekly exercise routine. Try to do these types of exercises for 30 minutes at least 3 days a week.  Do not use any products that contain nicotine or tobacco, such as cigarettes and e-cigarettes. If you need help quitting, ask your health care provider.  Control any long-term (chronic) conditions you have, such as high cholesterol  or diabetes. Monitoring  Monitor your blood pressure at home as told by your health care provider. Your personal target blood pressure may vary depending on your medical conditions, your age, and other factors.  Have your blood pressure checked regularly, as often as told by your health care provider. Working with your health care provider  Review all the medicines you take with your health care provider because there may be side effects or interactions.  Talk with your health care provider about your diet, exercise habits, and other lifestyle factors that may be contributing to hypertension.  Visit your health care provider regularly. Your health care provider can help you create and adjust your plan for managing hypertension. Will I need medicine to control my blood pressure? Your health care provider may prescribe medicine if lifestyle changes are not enough to get your blood pressure under control, and if:  Your systolic blood pressure is 130 or higher.  Your diastolic blood pressure is 80 or higher. Take medicines only as told by your health care provider. Follow the directions carefully. Blood pressure medicines must be taken as prescribed. The medicine does not work as well when you skip doses. Skipping doses also puts you at risk for problems. Contact a health care provider if:  You think you are having a reaction to medicines you have taken.  You have repeated (recurrent) headaches.  You feel dizzy.  You have swelling in your ankles.  You have trouble with your vision. Get help right away if:  You develop a severe headache or confusion.  You have unusual weakness or numbness, or you feel faint.  You have severe pain in your chest or abdomen.  You vomit repeatedly.  You have trouble breathing. Summary  Hypertension is when the force of blood pumping through your arteries is too strong. If this condition is not controlled, it may put you at risk for serious  complications.  Your personal target blood pressure may vary depending on your medical conditions, your age, and other factors. For most people, a normal blood pressure is less than 120/80.  Hypertension is managed by lifestyle changes, medicines, or both. Lifestyle changes include weight loss, eating a healthy, low-sodium diet, exercising more, and limiting alcohol. This information is not intended to replace advice given to you by your health care provider. Make sure you discuss any questions you have with your health care provider. Document Released:  12/11/2011 Document Revised: 02/14/2016 Document Reviewed: 02/14/2016 Elsevier Interactive Patient Education  [employer].

## 2018-06-07 NOTE — Discharge Summary (Signed)
Name: John Savage MRN: [id number] DOB: [birth year] 38 y.o. PCP: Patient, No Pcp Per  Date of Admission: 06/06/2018  3:15 PM Date of Discharge: 06/07/2018 Attending Physician: John Savage, *   Discharge Diagnosis: 1. Upper Respiratory Infection 2/2 non CoVD 19 coronavirus (NL63) 2. Severe Hypertension  Discharge Medications: Allergies as of 06/07/2018      Reactions   Aspirin Other (See Comments)   Loose blood   Novocain [procaine] Other (See Comments)   unknown      Medication List    STOP taking these medications   amLODipine 5 MG tablet Commonly known as:  NORVASC   ibuprofen 200 MG tablet Commonly known as:  ADVIL,MOTRIN   NYQUIL PO   ondansetron 4 MG disintegrating tablet Commonly known as:  Zofran ODT     TAKE these medications   hydrochlorothiazide 25 MG tablet Commonly known as:  HYDRODIURIL Take 1 tablet (25 mg total) by mouth daily.   lisinopril 20 MG tablet Commonly known as:  PRINIVIL,ZESTRIL Take 1 tablet (20 mg total) by mouth daily.       Disposition and follow-up:   John Savage was discharged from [facility] in Good condition.  At the hospital follow up visit please address:  1.A  Severe HTN: BP on admission 230's systolic. Started on lisinopril and HCTZ with improvement to 160's systolic. Would recommend slow titration of antihypertensives to control BP. May need to consider resuming Amlodipine if her has persistent hypokalemia on HCTZ  1.B URI-symptoms of cough, headache, rhinorrhea mild, RVP + only for Coronavirus NL63. No indication to test CoVD 19  2.  Labs / imaging needed at time of follow-up: BMP (Mild hypokalemia)  3.  Pending labs/ test needing follow-up: Aldo/Renin  Follow-up Appointments: Follow-up Information    [facility] [place], [place]. Schedule an appointment as soon as possible for a visit.   Why:  Call and schedule a hospital discharge follow-up          Hospital  Course by problem list: 1. Severe HTN: John Savage is a 38 yo M who presented to the ED for viral URI symptoms and severe HTN. He has a PMHx of HTN for which he was taking Amlodipine daily. BP responded appropriately to initiation of a combination of thiazide/ACEi therapy but became moderately hypokalemic with repletion completed. BP improved to 160's systolic prior to discharge.   2. Viral URI symptoms:  Of note, due to his unexplained viral like illness a viral panel was ordered to better determine treatment. RVP revealed NL63 coronavirus. No further testing and supportive care is advised.  Discharge Vitals:   BP (!) 171/119 (BP Location: Left Arm)   Pulse 83   Temp 98.3 F (36.8 C) (Oral)   Resp (!) 27   Ht 5\' 5"  (1.651 m)   Wt 77.4 kg   SpO2 93%   BMI 28.40 kg/m   Pertinent Labs, Studies, and Procedures:  BMP Latest Ref Rng & Units 06/06/2018 05/22/2015 03/21/2010  Glucose 70 - 99 mg/dL 060(R) 561(B) 97  BUN 6 - 20 mg/dL 12 12 4(L)  Creatinine 0.61 - 1.24 mg/dL 3.79(K) 3.27(M) 1.1  Sodium 135 - 145 mmol/L 141 138 142  Potassium 3.5 - 5.1 mmol/L 3.1(L) 4.2 3.5  Chloride 98 - 111 mmol/L 105 102 103  CO2 22 - 32 mmol/L 25 23 -  Calcium 8.9 - 10.3 mg/dL 9.3 9.8 -    Discharge Instructions: Discharge Instructions    Diet - low sodium heart  healthy   Complete by:  As directed    Increase activity slowly   Complete by:  As directed       Signed: Lanelle Bal, MD 06/08/2018, 10:42 AM   Pager: Pager# [phone]

## 2018-06-07 NOTE — Progress Notes (Signed)
   Subjective: The patient was lying in his bed today upon entering the room. He continues to state that he has experienced upper respiratory infection like symptoms including headache, runny nose, mild unproductive cough in addition to his severe HTN. He noted being started on amlodipine recently but did not recall his past blood pressures well.   Objective:  Vital signs in last 24 hours: Vitals:   06/07/18 0700 06/07/18 0800 06/07/18 1000 06/07/18 1135  BP: (!) 141/94 (!) 134/96 (!) 176/121 (!) 165/113  Pulse: 98 98  98  Resp: (!) 22 (!) 22  20  Temp: 99 F (37.2 C)   98.8 F (37.1 C)  TempSrc: Oral   Oral  SpO2: 96% 97% 96% 92%  Weight:      Height:       General: A/O x4, in no acute distress, afebrile, nondiaphoretic Cardio: RRR, no mrg's  Pulmonary: CTA bilaterally, no wheezing or crackles  MSK: BLE nontender, nonedematous Psych: Appropriate affect, not depressed in appearance, engages well  Assessment/Plan:  Principal Problem:   Severe hypertension Active Problems:   Acute upper respiratory infection   Headache   Abnormal brain MRI  Assessment: Wissam Giglio is a 38 yo M who presented to the ED for viral URI symptoms and severe HTN. He has a PMHx of HTN for which he was taking Amlodipine daily. BP responded appropriately to initiation of a combo thiazide/ACEi therapy but became moderately hypokalemic. Will need to be monitored when discharged. Of note, due to his unexplained viral like illness a viral panel was ordered to better determine treatment.   Severe HTN: We will cautiously initiate antihypertensive therapy with the combo HCTZ-lisinopril tablet. Aggressive inpatient blood pressure regulation has been shown to be harmful and as such this process should be slowly performed as an outpatient over the next several weeks. A goal of ~160 systolic is acceptable at discharge with the eventual target of >140/90.  Continue HCTZ-Lisinopril BMP in one week with  PCP Ordered renal artery duplex, results of 1-59% are less than the typical >70% expected to induce symptoms Renin-aldo pending-can be followed up as an outpatient  Viral URI: Ordered RVP panel, will follow. Continue Tylenol. Monitor vitals  Diet: Heart Healthy Code: Full VTE PPX: enoxaparin Fluids: N/A Dispo: Anticipated discharge in approximately 0-1 day(s).   Lanelle Bal, MD 06/07/2018, 12:01 PM Pager# [phone]

## 2018-06-07 NOTE — Progress Notes (Signed)
Renal artery duplex completed.  Refer to "CV Proc" under chart review to view preliminary results.  06/07/2018 9:47 AM Gertie Fey, MHA, RVT, RDCS, RDMS

## 2018-06-07 NOTE — Progress Notes (Signed)
Paged Dr. Oswaldo Done regarding blood pressures still 200/130s.  Instructed to continue to monitor patient and page back if new symptoms arise.  Will continue to monitor.

## 2018-06-07 NOTE — Plan of Care (Signed)

## 2018-06-10 LAB — ALDOSTERONE + RENIN ACTIVITY W/ RATIO
ALDO / PRA Ratio: 18.2 (ref 0.0–30.0)
Aldosterone: 7.6 ng/dL (ref 0.0–30.0)
PRA LC/MS/MS: 0.417 ng/mL/hr (ref 0.167–5.380)

## 2018-06-23 ENCOUNTER — Ambulatory Visit (INDEPENDENT_AMBULATORY_CARE_PROVIDER_SITE_OTHER): Payer: Managed Care, Other (non HMO) | Admitting: Family Medicine

## 2018-06-23 ENCOUNTER — Other Ambulatory Visit: Payer: Self-pay

## 2018-06-23 ENCOUNTER — Encounter: Payer: Self-pay | Admitting: Family Medicine

## 2018-06-23 VITALS — BP 199/146 | HR 85 | Temp 98.3°F | Resp 17 | Ht 64.0 in | Wt 169.6 lb

## 2018-06-23 DIAGNOSIS — I10 Essential (primary) hypertension: Secondary | ICD-10-CM

## 2018-06-23 DIAGNOSIS — R9089 Other abnormal findings on diagnostic imaging of central nervous system: Secondary | ICD-10-CM

## 2018-06-23 DIAGNOSIS — R930 Abnormal findings on diagnostic imaging of skull and head, not elsewhere classified: Secondary | ICD-10-CM

## 2018-06-23 DIAGNOSIS — Z7689 Persons encountering health services in other specified circumstances: Secondary | ICD-10-CM

## 2018-06-23 DIAGNOSIS — Z131 Encounter for screening for diabetes mellitus: Secondary | ICD-10-CM

## 2018-06-23 DIAGNOSIS — Z1389 Encounter for screening for other disorder: Secondary | ICD-10-CM | POA: Diagnosis not present

## 2018-06-23 DIAGNOSIS — I499 Cardiac arrhythmia, unspecified: Secondary | ICD-10-CM

## 2018-06-23 DIAGNOSIS — R9431 Abnormal electrocardiogram [ECG] [EKG]: Secondary | ICD-10-CM | POA: Diagnosis not present

## 2018-06-23 LAB — POCT URINALYSIS DIP (CLINITEK)
Bilirubin, UA: NEGATIVE
Glucose, UA: NEGATIVE mg/dL
Ketones, POC UA: NEGATIVE mg/dL
Leukocytes, UA: NEGATIVE
Nitrite, UA: NEGATIVE
SPEC GRAV UA: 1.015 (ref 1.010–1.025)
Urobilinogen, UA: 0.2 E.U./dL
pH, UA: 7 (ref 5.0–8.0)

## 2018-06-23 MED ORDER — CARVEDILOL 3.125 MG PO TABS: 3.1250 mg | ORAL_TABLET | Freq: Two times a day (BID) | ORAL | 3 refills | Status: DC

## 2018-06-23 MED ORDER — CLONIDINE HCL 0.2 MG PO TABS
0.2000 mg | ORAL_TABLET | ORAL | Status: AC
  Administered 2018-06-23: 0.2 mg via ORAL

## 2018-06-23 MED ORDER — HYDROCHLOROTHIAZIDE 25 MG PO TABS: 25.0000 mg | ORAL_TABLET | Freq: Every day | ORAL | 2 refills | Status: DC

## 2018-06-23 MED ORDER — CLONIDINE HCL 0.1 MG PO TABS
0.1000 mg | ORAL_TABLET | Freq: Once | ORAL | Status: AC
  Administered 2018-06-23: 0.1 mg via ORAL

## 2018-06-23 MED ORDER — LISINOPRIL 20 MG PO TABS: 20.0000 mg | ORAL_TABLET | Freq: Every day | ORAL | 2 refills | Status: DC

## 2018-06-23 MED ORDER — AMLODIPINE BESYLATE 5 MG PO TABS: 5.0000 mg | ORAL_TABLET | Freq: Every day | ORAL | 3 refills | Status: DC

## 2018-06-23 NOTE — Patient Instructions (Addendum)
Thank you for choosing Primary Care at [facility] to be your medical home!    John Savage was seen by Joaquin Courts, FNP today.   John Savage's primary care provider is Patient, No Pcp Per.   For the best care possible, you should try to see Joaquin Courts, FNP-C whenever you come to the clinic.   We look forward to seeing you again soon!  If you have any questions about your visit today, please call us at [phone] or feel free to reach your primary care provider via MyChart.     Pick-up medication today and take one dose of each medication today.  Tomorrow begin taking medication as prescribed, Lisinopril and hydrochlorothiazide in the mornings and Amlodipine and carvedilol in the evening prior to bedtime.   Carvedilol is schedule twice daily, therefore take as directed.  I encourage you to purchase a blood pressure cuff to monitor your blood pressure at home. A goal blood pressure for you, should be less than 140/90.    Hypertension Hypertension, commonly called high blood pressure, is when the force of blood pumping through the arteries is too strong. The arteries are the blood vessels that carry blood from the heart throughout the body. Hypertension forces the heart to work harder to pump blood and may cause arteries to become narrow or stiff. Having untreated or uncontrolled hypertension can cause heart attacks, strokes, kidney disease, and other problems. A blood pressure reading consists of a higher number over a lower number. Ideally, your blood pressure should be below 120/80. The first ("top") number is called the systolic pressure. It is a measure of the pressure in your arteries as your heart beats. The second ("bottom") number is called the diastolic pressure. It is a measure of the pressure in your arteries as the heart relaxes. What are the causes? The cause of this condition is not known. What increases the risk? Some risk factors for high  blood pressure are under your control. Others are not. Factors you can change  Smoking.  Having type 2 diabetes mellitus, high cholesterol, or both.  Not getting enough exercise or physical activity.  Being overweight.  Having too much fat, sugar, calories, or salt (sodium) in your diet.  Drinking too much alcohol. Factors that are difficult or impossible to change  Having chronic kidney disease.  Having a family history of high blood pressure.  Age. Risk increases with age.  Race. You may be at higher risk if you are African-American.  Gender. Men are at higher risk than women before age 30. After age 21, women are at higher risk than men.  Having obstructive sleep apnea.  Stress. What are the signs or symptoms? Extremely high blood pressure (hypertensive crisis) may cause:  Headache.  Anxiety.  Shortness of breath.  Nosebleed.  Nausea and vomiting.  Severe chest pain.  Jerky movements you cannot control (seizures). How is this diagnosed? This condition is diagnosed by measuring your blood pressure while you are seated, with your arm resting on a surface. The cuff of the blood pressure monitor will be placed directly against the skin of your upper arm at the level of your heart. It should be measured at least twice using the same arm. Certain conditions can cause a difference in blood pressure between your right and left arms. Certain factors can cause blood pressure readings to be lower or higher than normal (elevated) for a short period of time:  When your blood pressure is higher when you are  in a health care provider's office than when you are at home, this is called white coat hypertension. Most people with this condition do not need medicines.  When your blood pressure is higher at home than when you are in a health care provider's office, this is called masked hypertension. Most people with this condition may need medicines to control blood pressure. If you  have a high blood pressure reading during one visit or you have normal blood pressure with other risk factors:  You may be asked to return on a different day to have your blood pressure checked again.  You may be asked to monitor your blood pressure at home for 1 week or longer. If you are diagnosed with hypertension, you may have other blood or imaging tests to help your health care provider understand your overall risk for other conditions. How is this treated? This condition is treated by making healthy lifestyle changes, such as eating healthy foods, exercising more, and reducing your alcohol intake. Your health care provider may prescribe medicine if lifestyle changes are not enough to get your blood pressure under control, and if:  Your systolic blood pressure is above 130.  Your diastolic blood pressure is above 80. Your personal target blood pressure may vary depending on your medical conditions, your age, and other factors. Follow these instructions at home: Eating and drinking   Eat a diet that is high in fiber and potassium, and low in sodium, added sugar, and fat. An example eating plan is called the DASH (Dietary Approaches to Stop Hypertension) diet. To eat this way: ? Eat plenty of fresh fruits and vegetables. Try to fill half of your plate at each meal with fruits and vegetables. ? Eat whole grains, such as whole wheat pasta, brown rice, or whole grain bread. Fill about one quarter of your plate with whole grains. ? Eat or drink low-fat dairy products, such as skim milk or low-fat yogurt. ? Avoid fatty cuts of meat, processed or cured meats, and poultry with skin. Fill about one quarter of your plate with lean proteins, such as fish, chicken without skin, beans, eggs, and tofu. ? Avoid premade and processed foods. These tend to be higher in sodium, added sugar, and fat.  Reduce your daily sodium intake. Most people with hypertension should eat less than 1,500 mg of sodium a  day.  Limit alcohol intake to no more than 1 drink a day for nonpregnant women and 2 drinks a day for men. One drink equals 12 oz of beer, 5 oz of wine, or 1 oz of hard liquor. Lifestyle   Work with your health care provider to maintain a healthy body weight or to lose weight. Ask what an ideal weight is for you.  Get at least 30 minutes of exercise that causes your heart to beat faster (aerobic exercise) most days of the week. Activities may include walking, swimming, or biking.  Include exercise to strengthen your muscles (resistance exercise), such as pilates or lifting weights, as part of your weekly exercise routine. Try to do these types of exercises for 30 minutes at least 3 days a week.  Do not use any products that contain nicotine or tobacco, such as cigarettes and e-cigarettes. If you need help quitting, ask your health care provider.  Monitor your blood pressure at home as told by your health care provider.  Keep all follow-up visits as told by your health care provider. This is important. Medicines  Take over-the-counter and prescription  medicines only as told by your health care provider. Follow directions carefully. Blood pressure medicines must be taken as prescribed.  Do not skip doses of blood pressure medicine. Doing this puts you at risk for problems and can make the medicine less effective.  Ask your health care provider about side effects or reactions to medicines that you should watch for. Contact a health care provider if:  You think you are having a reaction to a medicine you are taking.  You have headaches that keep coming back (recurring).  You feel dizzy.  You have swelling in your ankles.  You have trouble with your vision. Get help right away if:  You develop a severe headache or confusion.  You have unusual weakness or numbness.  You feel faint.  You have severe pain in your chest or abdomen.  You vomit repeatedly.  You have trouble  breathing. Summary  Hypertension is when the force of blood pumping through your arteries is too strong. If this condition is not controlled, it may put you at risk for serious complications.  Your personal target blood pressure may vary depending on your medical conditions, your age, and other factors. For most people, a normal blood pressure is less than 120/80.  Hypertension is treated with lifestyle changes, medicines, or a combination of both. Lifestyle changes include weight loss, eating a healthy, low-sodium diet, exercising more, and limiting alcohol. This information is not intended to replace advice given to you by your health care provider. Make sure you discuss any questions you have with your health care provider. Document Released: 03/18/2005 Document Revised: 02/14/2016 Document Reviewed: 02/14/2016 Elsevier Interactive Patient Education  2019 [employer].     How to Take Your Blood Pressure You can take your blood pressure at home with a machine. You may need to check your blood pressure at home:  To check if you have high blood pressure (hypertension).  To check your blood pressure over time.  To make sure your blood pressure medicine is working. Supplies needed: You will need a blood pressure machine, or monitor. You can buy one at a drugstore or online. When choosing one:  Choose one with an arm cuff.  Choose one that wraps around your upper arm. Only one finger should fit between your arm and the cuff.  Do not choose one that measures your blood pressure from your wrist or finger. Your doctor can suggest a monitor. How to prepare Avoid these things for 30 minutes before checking your blood pressure:  Drinking caffeine.  Drinking alcohol.  Eating.  Smoking.  Exercising. Five minutes before checking your blood pressure:  Pee.  Sit in a dining chair. Avoid sitting in a soft couch or armchair.  Be quiet. Do not talk. How to take your blood  pressure Follow the instructions that came with your machine. If you have a digital blood pressure monitor, these may be the instructions: 1. Sit up straight. 2. Place your feet on the floor. Do not cross your ankles or legs. 3. Rest your left arm at the level of your heart. You may rest it on a table, desk, or chair. 4. Pull up your shirt sleeve. 5. Wrap the blood pressure cuff around the upper part of your left arm. The cuff should be 1 inch (2.5 cm) above your elbow. It is best to wrap the cuff around bare skin. 6. Fit the cuff snugly around your arm. You should be able to place only one finger between the cuff  and your arm. 7. Put the cord inside the groove of your elbow. 8. Press the power button. 9. Sit quietly while the cuff fills with air and loses air. 10. Write down the numbers on the screen. 11. Wait 2-3 minutes and then repeat steps 1-10. What do the numbers mean? Two numbers make up your blood pressure. The first number is called systolic pressure. The second is called diastolic pressure. An example of a blood pressure reading is "120 over 80" (or 120/80). If you are an adult and do not have a medical condition, use this guide to find out if your blood pressure is normal: Normal  First number: below 120.  Second number: below 80. Elevated  First number: 120-129.  Second number: below 80. Hypertension stage 1  First number: 130-139.  Second number: 80-89. Hypertension stage 2  First number: 140 or above.  Second number: 90 or above. Your blood pressure is above normal even if only the top or bottom number is above normal. Follow these instructions at home:  Check your blood pressure as often as your doctor tells you to.  Take your monitor to your next doctor's appointment. Your doctor will: ? Make sure you are using it correctly. ? Make sure it is working right.  Make sure you understand what your blood pressure numbers should be.  Tell your doctor if your  medicines are causing side effects. Contact a doctor if:  Your blood pressure keeps being high. Get help right away if:  Your first blood pressure number is higher than 180.  Your second blood pressure number is higher than 120. This information is not intended to replace advice given to you by your health care provider. Make sure you discuss any questions you have with your health care provider. Document Released: 02/29/2008 Document Revised: 02/14/2016 Document Reviewed: 08/25/2015 Elsevier Interactive Patient Education  2019 [employer].

## 2018-06-23 NOTE — Progress Notes (Signed)
John Savage, is a 38 y.o. male  EBR:[id number]  GSU:[id number]  DOB - 05/30/1980  CC:  Chief Complaint  Patient presents with  . Establish Care  . Hospitalization Follow-up    ED->Hosp 3/7-3/8: uncontrolled BP, URI, abnormal brain MRI       HPI: John Savage is a 38 y.o. male is here today to establish care.   [name] has Severe hypertension; Acute upper respiratory infection; Headache; and Abnormal brain MRI on their problem list.   Accelerated Hypertension  Patient presents today with severely elevated accelerated hypertension presents to office for hypertension management  following a recent impatient admission for uncontrolled hypertension with headache. Current blood pressure on arrival 231/132 and 207/129. Prior to hospital discharge, patient was prescribed lisinopril and HCTZ x 30 day supply. Patient reports today that he completed all medication last Friday 20 th of March as he was taking each medication twice daily instead of as prescribed once daily. Denies current or recurrence of headache, chest pain, visual changes, activity intolerance or shortness of breath. Endorses occasional palpitations. Uncertain of effective of medication since discharge, as patient doesn't monitor blood pressure at home. During patient's admission, he was treated supportively for a viral respiratory illness which has completely resolved.   Abnormal MRI Prior to admission, patient developed a headache for which he categorized as the worst HA of his life. Initially a CT head without contrast was performed:  CT of Head W/O contrast  Impression:  IMPRESSION: 1. Subcortical white matter changes/hypoattenuation best seen in the frontal lobes toward the vertex are nonspecific. Recommend an MRI for further assessment. 2. Mild mucosal thickening in scattered ethmoid air cells. 3. No other acute abnormalities.  Given findings of CT of Head, a follow-up MRI of brain was  completed.  MRI of Brain impression: IMPRESSION: 1. Periventricular T2 hyperintensities are moderately advanced for age. The finding is nonspecific but can be seen in the setting of chronic microvascular ischemia, a demyelinating process such as multiple sclerosis, vasculitis, complicated migraine headaches, or as the sequelae of a prior infectious or inflammatory process. Given the patient's severe hypertension, this most likely represents small vessel disease. 2. No acute intracranial abnormality.  Current medications: Current Outpatient Medications:  .  hydrochlorothiazide (HYDRODIURIL) 25 MG tablet, Take 1 tablet (25 mg total) by mouth daily., Disp: 30 tablet, Rfl: 2 .  lisinopril (PRINIVIL,ZESTRIL) 20 MG tablet, Take 1 tablet (20 mg total) by mouth daily., Disp: 30 tablet, Rfl: 2   Pertinent family medical history: Family history is unknown by patient.   Allergies  Allergen Reactions  . Aspirin Other (See Comments)    Loose blood  . Novocain [Procaine] Other (See Comments)    unknown    Social History   Socioeconomic History  . Marital status: Married    Spouse name: Not on file  . Number of children: Not on file  . Years of education: Not on file  . Highest education level: Not on file  Occupational History  . Not on file  Social Needs  . Financial resource strain: Not on file  . Food insecurity:    Worry: Not on file    Inability: Not on file  . Transportation needs:    Medical: Not on file    Non-medical: Not on file  Tobacco Use  . Smoking status: Never Smoker  . Smokeless tobacco: Never Used  Substance and Sexual Activity  . Alcohol use: No  . Drug use: No  . Sexual activity: Not on file  Lifestyle  . Physical activity:    Days per week: Not on file    Minutes per session: Not on file  . Stress: Not on file  Relationships  . Social connections:    Talks on phone: Not on file    Gets together: Not on file    Attends religious service: Not on file     Active member of club or organization: Not on file    Attends meetings of clubs or organizations: Not on file    Relationship status: Not on file  . Intimate partner violence:    Fear of current or ex partner: Not on file    Emotionally abused: Not on file    Physically abused: Not on file    Forced sexual activity: Not on file  Other Topics Concern  . Not on file  Social History Narrative  . Not on file    Review of Systems: Pertinent negatives listed in HPI  Objective:   Vitals:   06/23/18 1414 06/23/18 1434  BP: (!) 209/129 (!) 199/146  Pulse: 85   Resp:    Temp:    SpO2:      BP Readings from Last 3 Encounters:  06/07/18 (!) 171/119  06/05/18 (!) 228/136  05/22/15 (!) 150/101    Filed Weights   06/23/18 1336  Weight: 169 lb 9.6 oz (76.9 kg)      Physical Exam: Constitutional: Patient appears well-developed and well-nourished. No distress. HENT: Normocephalic, atraumatic, External right and left ear normal.  Eyes: Conjunctivae and EOM are normal. PERRLA, no scleral icterus. Neck: Normal ROM. Neck supple. No JVD. No tracheal deviation. No thyromegaly. CVS: Irregular rhythm, regular rate, S1/S2 +, no murmurs, no gallops, no carotid bruit.  Pulmonary: Effort and breath sounds normal, no stridor, rhonchi, wheezes, rales.  Abdominal: Soft. BS +, no distension, tenderness, rebound or guarding.  Musculoskeletal: Normal range of motion. No edema and no tenderness.  Neuro: Alert. Normal muscle tone coordination. Normal gait. BUE and BLE strength 5/5. Bilateral hand grips symmetrical. No cranial nerve deficit. Skin: Skin is warm and dry. No rash noted. Not diaphoretic. No erythema. No pallor. Psychiatric: Normal mood and affect. Behavior, judgment, thought content normal.  Lab Results (prior encounters)  Lab Results  Component Value Date   WBC 6.9 06/06/2018   HGB 13.8 06/06/2018   HCT 41.5 06/06/2018   MCV 90.6 06/06/2018   PLT 302 06/06/2018   Lab Results   Component Value Date   CREATININE 1.32 (H) 06/06/2018   BUN 12 06/06/2018   NA 141 06/06/2018   K 3.1 (L) 06/06/2018   CL 105 06/06/2018   CO2 25 06/06/2018       Assessment and plan:  1. Encounter to establish care   2. Screening for blood or protein in urine -negative for protein -trace hematuria present, will continue to monitor  3. Screening for diabetes mellitus -Checking A1C   4. Accelerated hypertension, uncontrolled Blood pressure on arrival excessively elevated.  Patient inadvertently missed managed medication and ran out earlier the medication was prescribed as he was doubling up on his doses daily.  Provided extensive education regarding medications frequency and designated times medication should be taken. Provided patient written instructions on how to take medication. Received verbal feedback from patient to date and his understanding of how to take his medications. -Patient will continue lisinopril and hydrochlorothiazide Added amlodipine ( and carvedilol (management palpitations and decrease work-load of heart)  5. Irregular cardiac rhythm, Asymptomatic of chest pain -  ECG indicated SR with extensive T-wave abnormal with anterolateral ischemia -Patient will benefit from non-emergent cardiology follow-up    6. Abnormal finding on MRI of brain -Referral place non-emergently to neurology for further evaluation    Return in about 2 days (around 06/25/2018) for 5:00 for blood pressure check and 6 weeks with provider .   The patient was given clear instructions to go to ER or return to medical center if symptoms don't improve, worsen or new problems develop. The patient verbalized understanding. The patient was advised  to call and obtain lab results if they haven't heard anything from out office within 7-10 business days.  Joaquin Courts, FNP Primary Care at [facility] [address], [place] [address] [phone]: [phone]     This note has been created with Dragon speech recognition software and Paediatric nurse. Any transcriptional errors are unintentional.

## 2018-06-24 ENCOUNTER — Telehealth: Payer: Self-pay | Admitting: Neurology

## 2018-06-24 LAB — CBC WITH DIFFERENTIAL/PLATELET
Basophils Absolute: 0.1 10*3/uL (ref 0.0–0.2)
Basos: 1 %
EOS (ABSOLUTE): 0.1 10*3/uL (ref 0.0–0.4)
Eos: 2 %
Hematocrit: 37.3 % — ABNORMAL LOW (ref 37.5–51.0)
Hemoglobin: 13.1 g/dL (ref 13.0–17.7)
IMMATURE GRANS (ABS): 0 10*3/uL (ref 0.0–0.1)
Immature Granulocytes: 0 %
LYMPHS: 36 %
Lymphocytes Absolute: 2.4 10*3/uL (ref 0.7–3.1)
MCH: 30.5 pg (ref 26.6–33.0)
MCHC: 35.1 g/dL (ref 31.5–35.7)
MCV: 87 fL (ref 79–97)
Monocytes Absolute: 0.6 10*3/uL (ref 0.1–0.9)
Monocytes: 9 %
Neutrophils Absolute: 3.3 10*3/uL (ref 1.4–7.0)
Neutrophils: 52 %
PLATELETS: 486 10*3/uL — AB (ref 150–450)
RBC: 4.29 x10E6/uL (ref 4.14–5.80)
RDW: 12 % (ref 11.6–15.4)
WBC: 6.5 10*3/uL (ref 3.4–10.8)

## 2018-06-24 LAB — COMPREHENSIVE METABOLIC PANEL
ALT: 61 IU/L — ABNORMAL HIGH (ref 0–44)
AST: 33 IU/L (ref 0–40)
Albumin/Globulin Ratio: 1.6 (ref 1.2–2.2)
Albumin: 4.5 g/dL (ref 4.0–5.0)
Alkaline Phosphatase: 98 IU/L (ref 39–117)
BUN/Creatinine Ratio: 11 (ref 9–20)
BUN: 12 mg/dL (ref 6–20)
Bilirubin Total: 0.2 mg/dL (ref 0.0–1.2)
CALCIUM: 9.6 mg/dL (ref 8.7–10.2)
CO2: 23 mmol/L (ref 20–29)
Chloride: 102 mmol/L (ref 96–106)
Creatinine, Ser: 1.08 mg/dL (ref 0.76–1.27)
GFR calc Af Amer: 101 mL/min/{1.73_m2} (ref >59)
GFR calc non Af Amer: 87 mL/min/{1.73_m2} (ref >59)
Globulin, Total: 2.9 g/dL (ref 1.5–4.5)
Glucose: 87 mg/dL (ref 65–99)
Potassium: 3.9 mmol/L (ref 3.5–5.2)
Sodium: 140 mmol/L (ref 134–144)
Total Protein: 7.4 g/dL (ref 6.0–8.5)

## 2018-06-24 LAB — HEMOGLOBIN A1C
Est. average glucose Bld gHb Est-mCnc: 97 mg/dL
Hgb A1c MFr Bld: 5 % (ref 4.8–5.6)

## 2018-06-24 LAB — TSH: TSH: 0.775 u[IU]/mL (ref 0.450–4.500)

## 2018-06-24 NOTE — Telephone Encounter (Signed)
Hey Dr. Epimenio Foot. We received an internal referral on pt for an abnormal finding on her MRI. Could you take a look and see if maybe pt should be seen before June?

## 2018-06-24 NOTE — Telephone Encounter (Signed)
We can wait until June.

## 2018-06-25 ENCOUNTER — Ambulatory Visit: Payer: Managed Care, Other (non HMO)

## 2018-06-25 ENCOUNTER — Other Ambulatory Visit: Payer: Self-pay

## 2018-06-25 VITALS — BP 178/111 | HR 72

## 2018-06-25 DIAGNOSIS — Z719 Counseling, unspecified: Secondary | ICD-10-CM

## 2018-06-25 MED ORDER — LISINOPRIL 40 MG PO TABS: 40.0000 mg | ORAL_TABLET | Freq: Every day | ORAL | 2 refills | Status: DC

## 2018-06-25 MED ORDER — AMLODIPINE BESYLATE 10 MG PO TABS: 10.0000 mg | ORAL_TABLET | Freq: Every day | ORAL | 2 refills | Status: DC

## 2018-06-25 NOTE — Progress Notes (Signed)
Patient here for BP check. States that he has taken all of his BP medications as prescribed today. After letting patient sit for 15 minutes his BP was 188/121 & pulse was 75 in the left arm. BP in right arm was 178/111 & pulse was 72. KWalker, CMA.

## 2018-06-25 NOTE — Telephone Encounter (Signed)
Noted, thank you

## 2018-06-25 NOTE — Progress Notes (Signed)
Patient notified of results & recommendations during nurse visit. Expressed understanding.

## 2018-06-25 NOTE — Patient Instructions (Addendum)
Changes to medications made today as follows:  Hydrochlorothiazide 25 mg once daily. Lisinopril 40 mg once daily (Take 2 of 20 mg tablets)  Amlodipine 10 mg once daily (take 2 of the 5 mg tablets)  Carvedilol no changes, continue same dose twice daily   Goal blood pressure reading: <140/90

## 2018-07-03 ENCOUNTER — Telehealth: Payer: Self-pay

## 2018-07-03 NOTE — Telephone Encounter (Signed)
Called patient to do their pre-visit COVID screening.  Have you recently traveled internationally([place], [place], [place], [place], [place]) or within the [place] to a hotspot area([place], [place], [place], [place], [place])? no  Are you currently experiencing any of the following: fever, cough, SHOB, fatigue? no  Have you been in contact with anyone who has recently travelled? no  Have you been in contact with anyone who is experiencing fever, cough, SHOB, fatigue or been diagnosed with COVID  or works in or has recently visited a SNF? no  

## 2018-07-06 ENCOUNTER — Ambulatory Visit (INDEPENDENT_AMBULATORY_CARE_PROVIDER_SITE_OTHER): Payer: Managed Care, Other (non HMO)

## 2018-07-06 ENCOUNTER — Other Ambulatory Visit: Payer: Self-pay

## 2018-07-06 VITALS — BP 111/67 | HR 75

## 2018-07-06 DIAGNOSIS — I10 Essential (primary) hypertension: Secondary | ICD-10-CM | POA: Diagnosis not present

## 2018-07-06 NOTE — Progress Notes (Signed)
Patient here for BP check. After sitting for 15 minutes BP was 111/67, pulse was 75. Spoke with provider & she states to have patient continue current medications & follow up in 6 months. KWalker, CMA.

## 2018-07-09 ENCOUNTER — Ambulatory Visit: Payer: Managed Care, Other (non HMO)

## 2018-07-27 ENCOUNTER — Other Ambulatory Visit: Payer: Self-pay | Admitting: *Deleted

## 2018-07-27 ENCOUNTER — Encounter: Payer: Self-pay | Admitting: *Deleted

## 2018-07-27 NOTE — Telephone Encounter (Signed)
Virtual Visit Pre-Appointment Phone Call  "(Name), I am calling you today to discuss your upcoming appointment. We are currently trying to limit exposure to the virus that causes COVID-19 by seeing patients at home rather than in the office."  1. "What is the BEST phone number to call the day of the visit?" - include this in appointment notes  2. Do you have or have access to (through a family member/friend) a smartphone with video capability that we can use for your visit?" a. If yes - list this number in appt notes as cell (if different from BEST phone #) and list the appointment type as a VIDEO visit in appointment notes b. If no - list the appointment type as a PHONE visit in appointment notes  3. Confirm consent - "In the setting of the current Covid19 crisis, you are scheduled for a (phone or video) visit with your provider on (date) at (time).  Just as we do with many in-office visits, in order for you to participate in this visit, we must obtain consent.  If you'd like, I can send this to your mychart (if signed up) or email for you to review.  Otherwise, I can obtain your verbal consent now.  All virtual visits are billed to your insurance company just like a normal visit would be.  By agreeing to a virtual visit, we'd like you to understand that the technology does not allow for your provider to perform an examination, and thus may limit your provider's ability to fully assess your condition. If your provider identifies any concerns that need to be evaluated in person, we will make arrangements to do so.  Finally, though the technology is pretty good, we cannot assure that it will always work on either your or our end, and in the setting of a video visit, we may have to convert it to a phone-only visit.  In either situation, we cannot ensure that we have a secure connection.  Are you willing to proceed?" STAFF: Did the patient verbally acknowledge consent to telehealth visit? Document  YES/NO here: YES  4. Advise patient to be prepared - "Two hours prior to your appointment, go ahead and check your blood pressure, pulse, oxygen saturation, and your weight (if you have the equipment to check those) and write them all down. When your visit starts, your provider will ask you for this information. If you have an Apple Watch or Kardia device, please plan to have heart rate information ready on the day of your appointment. Please have a pen and paper handy nearby the day of the visit as well."  5. Give patient instructions for MyChart download to smartphone OR Doximity/Doxy.me as below if video visit (depending on what platform provider is using)  6. Inform patient they will receive a phone call 15 minutes prior to their appointment time (may be from unknown caller ID) so they should be prepared to answer    TELEPHONE CALL NOTE  John Savage has been deemed a candidate for a follow-up tele-health visit to limit community exposure during the Covid-19 pandemic. I spoke with the patient via phone to ensure availability of phone/video source, confirm preferred email & phone number, and discuss instructions and expectations.  I reminded John Savage to be prepared with any vital sign and/or heart rhythm information that could potentially be obtained via home monitoring, at the time of his visit. I reminded John Savage to expect a phone call prior to his visit.  John Savage, John Savage, CMA 07/27/2018 3:04 PM   INSTRUCTIONS FOR DOWNLOADING THE MYCHART APP TO SMARTPHONE  - The patient must first make sure to have activated MyChart and know their login information - If Apple, go to [employer] Store and type in MyChart in the search bar and download the app. If Android, ask patient to go to [employer] Play Store and type in [facility]MyChart in the search bar and download the app. The app is free but as with any other app downloads, their phone may require them to verify saved payment  information or Apple/Android password.  - The patient will need to then log into the app with their MyChart username and password, and select [facility] as their healthcare provider to link the account. When it is time for your visit, go to the MyChart app, find appointments, and click Begin Video Visit. Be sure to Select Allow for your device to access the Microphone and Camera for your visit. You will then be connected, and your provider will be with you shortly.  **If they have any issues connecting, or need assistance please contact MyChart service desk [phone])**  **If using a computer, in order to ensure the best quality for their visit they will need to use either of the following Internet Browsers: [employer] Edge, or Google Chrome**  IF USING DOXIMITY or DOXY.ME - The patient will receive a link just prior to their visit by text.     FULL LENGTH CONSENT FOR TELE-HEALTH VISIT   I hereby voluntarily request, consent and authorize [facility] and its employed or contracted physicians, physician assistants, nurse practitioners or other licensed health care professionals (the Practitioner), to provide me with telemedicine health care services (the Services") as deemed necessary by the treating Practitioner. I acknowledge and consent to receive the Services by the Practitioner via telemedicine. I understand that the telemedicine visit will involve communicating with the Practitioner through live audiovisual communication technology and the disclosure of certain medical information by electronic transmission. I acknowledge that I have been given the opportunity to request an in-person assessment or other available alternative prior to the telemedicine visit and am voluntarily participating in the telemedicine visit.  I understand that I have the right to withhold or withdraw my consent to the use of telemedicine in the course of my care at any time, without affecting my right  to future care or treatment, and that the Practitioner or I may terminate the telemedicine visit at any time. I understand that I have the right to inspect all information obtained and/or recorded in the course of the telemedicine visit and may receive copies of available information for a reasonable fee.  I understand that some of the potential risks of receiving the Services via telemedicine include:   Delay or interruption in medical evaluation due to technological equipment failure or disruption;  Information transmitted may not be sufficient (e.g. poor resolution of images) to allow for appropriate medical decision making by the Practitioner; and/or   In rare instances, security protocols could fail, causing a breach of personal health information.  Furthermore, I acknowledge that it is my responsibility to provide information about my medical history, conditions and care that is complete and accurate to the best of my ability. I acknowledge that Practitioner's advice, recommendations, and/or decision may be based on factors not within their control, such as incomplete or inaccurate data provided by me or distortions of diagnostic images or specimens that may result from electronic transmissions. I understand that  the practice of medicine is not an exact science and that Practitioner makes no warranties or guarantees regarding treatment outcomes. I acknowledge that I will receive a copy of this consent concurrently upon execution via email to the email address I last provided but may also request a printed copy by calling the office of [facility].    I understand that my insurance will be billed for this visit.   I have read or had this consent read to me.  I understand the contents of this consent, which adequately explains the benefits and risks of the Services being provided via telemedicine.   I have been provided ample opportunity to ask questions regarding this consent and the Services  and have had my questions answered to my satisfaction.  I give my informed consent for the services to be provided through the use of telemedicine in my medical care  By participating in this telemedicine visit I agree to the above.       Cardiac Questionnaire:    Since your last visit or hospitalization:    1. Have you been having new or worsening chest pain? NO   2. Have you been having new or worsening shortness of breath? NO 3. Have you been having new or worsening leg swelling, wt gain, or increase in abdominal girth (pants fitting more tightly)? NO   4. Have you had any passing out spells? NO    *A YES to any of these questions would result in the appointment being kept. *If all the answers to these questions are NO, we should indicate that given the current situation regarding the worldwide coronarvirus pandemic, at the recommendation of the CDC, we are looking to limit gatherings in our waiting area, and thus will reschedule their appointment beyond four weeks from today.   _____________   COVID-19 Pre-Screening Questions:   Do you currently have a fever? NO  Have you recently travelled on a cruise, internationally, or to [place], [place], [place], [place], [place], or [place], [place] [employer]) ? NO  Have you been in contact with someone that is currently pending confirmation of Covid19 testing or has been confirmed to have the Covid19 virus?  NO  Are you currently experiencing fatigue or cough? NO

## 2018-07-28 ENCOUNTER — Telehealth: Payer: Self-pay

## 2018-07-28 ENCOUNTER — Telehealth (INDEPENDENT_AMBULATORY_CARE_PROVIDER_SITE_OTHER): Payer: Managed Care, Other (non HMO) | Admitting: Cardiovascular Disease

## 2018-07-28 DIAGNOSIS — I517 Cardiomegaly: Secondary | ICD-10-CM | POA: Diagnosis not present

## 2018-07-28 DIAGNOSIS — I10 Essential (primary) hypertension: Secondary | ICD-10-CM

## 2018-07-28 DIAGNOSIS — R9431 Abnormal electrocardiogram [ECG] [EKG]: Secondary | ICD-10-CM | POA: Diagnosis not present

## 2018-07-28 NOTE — Patient Instructions (Signed)
Medication Instructions:  Your physician recommends that you continue on your current medications as directed. Please refer to the Current Medication list given to you today.  If you need a refill on your cardiac medications before your next appointment, please call your pharmacy.   Lab work: NONE If you have labs (blood work) drawn today and your tests are completely normal, you will receive your results only by: Marland Kitchen MyChart Message (if you have MyChart) OR . A paper copy in the mail If you have any lab test that is abnormal or we need to change your treatment, we will call you to review the results.  Testing/Procedures: Your physician has requested that you have an echocardiogram. Echocardiography is a painless test that uses sound waves to create images of your heart. It provides your doctor with information about the size and shape of your heart and how well your heart's chambers and valves are working. This procedure takes approximately one hour. There are no restrictions for this procedure. LOCATION: [address], [place], [place] [address] TO BE SCHEDULED. YOU WILL BE CONTACTED BY OUR OFFICE TO SET UP AN APPOINTMENT.    Follow-Up: At [facility], you and your health needs are our priority.  As part of our continuing mission to provide you with exceptional heart care, we have created designated Provider Care Teams.  These Care Teams include your primary Cardiologist (physician) and Advanced Practice Providers (APPs -  Physician Assistants and Nurse Practitioners) who all work together to provide you with the care you need, when you need it. . You will need a follow up appointment in 3 months with an APP and in 6 months with Dr. Allyson Sabal for blood pressure mangagement.  Please call our office 2 months in advance to schedule each appointment.  You may see one of the following Advanced Practice Providers on your designated Care Team:   . Corine Shelter, [place] . Azalee Course, PA-C . Micah Flesher, PA-C . Joni Reining, DNP . Theodore Demark, PA-C . Judy Pimple, PA-C . Marjie Skiff, PA-C

## 2018-07-28 NOTE — Progress Notes (Signed)
Virtual Visit via Video Note   This visit type was conducted due to national recommendations for restrictions regarding the COVID-19 Pandemic (e.g. social distancing) in an effort to limit this patient's exposure and mitigate transmission in our community.  Due to his co-morbid illnesses, this patient is at least at moderate risk for complications without adequate follow up.  This format is felt to be most appropriate for this patient at this time.  All issues noted in this document were discussed and addressed.  A limited physical exam was performed with this format.  Please refer to the patient's chart for his consent to telehealth [facility].   Evaluation Performed:  Follow-up visit  Date:  07/28/2018   ID:  John Savage, DOB 12/30/1980, MRN [id number]  Patient Location: Other:  His workplace Provider Location: Home  PCP:  Bing NeighborsHarris, Kimberly S, FNP  Cardiologist: Dr. Nanetta BattyJonathan Birdell Frasier Electrophysiologist:  None   Chief Complaint: Hypertension  History of Present Illness:    John Savage is a 38 y.o. 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  He was referred by his PCP, Joaquin CourtsKimberly Harris, FNP, for evaluation of hypertension and abnormal EKG.  [place] is originally from [place].  Has been in the states for 15 years.  His only risk factor is hypertension.  There is no family history for heart disease.  Is never had a heart attack or stroke.  He denies chest pain or shortness of breath.  He was recently in the hospital in March for upper respiratory tract infection.  He was hypertensive at that time.  His medications were adjusted.  He is currently on lisinopril and hydrochlorothiazide.  He is unable to check his blood pressure at home because he does not have a cuff.  His EKG showed sinus rhythm with LVH and repolarization changes.  The patient does not have symptoms concerning for COVID-19 infection (fever, chills, cough, or new shortness  of breath).    Past Medical History:  Diagnosis Date  . Hypertension    No past surgical history on file.   Current Meds  Medication Sig  . amLODipine (NORVASC) 10 MG tablet Take 1 tablet (10 mg total) by mouth daily.  . carvedilol (COREG) 3.125 MG tablet Take 1 tablet (3.125 mg total) by mouth 2 (two) times daily with a meal.  . hydrochlorothiazide (HYDRODIURIL) 25 MG tablet Take 1 tablet (25 mg total) by mouth daily.  Marland Kitchen. lisinopril (PRINIVIL,ZESTRIL) 40 MG tablet Take 1 tablet (40 mg total) by mouth daily.     Allergies:   Aspirin and Novocain [procaine]   Social History   Tobacco Use  . Smoking status: Never Smoker  . Smokeless tobacco: Never Used  Substance Use Topics  . Alcohol use: No  . Drug use: No     Family Hx: The patient's Family history is unknown by patient.  ROS:   Please see the history of present illness.     All other systems reviewed and are negative.   Prior CV studies:   The following studies were reviewed today:  None  Labs/Other Tests and Data Reviewed:    EKG:  An ECG dated 06/23/2018 was personally reviewed today and demonstrated:  Sinus rhythm at 73 with LVH and repolarization changes  Recent Labs: 06/23/2018: ALT 61; BUN 12; Creatinine, Ser 1.08; Hemoglobin 13.1; Platelets 486; Potassium 3.9; Sodium 140; TSH 0.775   Recent Lipid Panel No results found for: CHOL, TRIG, HDL, CHOLHDL, LDLCALC, LDLDIRECT  Wt Readings from Last 3 Encounters:  07/28/18 169 lb (76.7 kg)  06/23/18 169 lb 9.6 oz (76.9 kg)  06/06/18 170 lb 10.2 oz (77.4 kg)     Objective:    Vital Signs:  Ht 5\' 4"  (1.626 m)   Wt 169 lb (76.7 kg)   BMI 29.01 kg/m    VITAL SIGNS:  reviewed GEN:  no acute distress RESPIRATORY:  normal respiratory effort, symmetric expansion NEURO:  alert and oriented x 3, no obvious focal deficit PSYCH:  normal affect  ASSESSMENT & PLAN:    1. Essential hypertension- blood pressure is elevated in the hospital.  Renal Dopplers were  negative.  His medications were changed.  Is currently on lisinopril and hydrochlorothiazide although he does not check his blood pressure at home.  Will need an office visit to further evaluate 2. Abnormal EKG- EKG showed sinus rhythm with LVH voltage and repolarization changes.  I am going to get a 2D echocardiogram to further evaluate LVH.  COVID-19 Education: The signs and symptoms of COVID-19 were discussed with the patient and how to seek care for testing (follow up with PCP or arrange E-visit).  The importance of social distancing was discussed today.  Time:   Today, I have spent 5 minutes with the patient with telehealth technology discussing the above problems.     Medication Adjustments/Labs and Tests Ordered: Current medicines are reviewed at length with the patient today.  Concerns regarding medicines are outlined above.   Tests Ordered: No orders of the defined types were placed in this encounter.   Medication Changes: No orders of the defined types were placed in this encounter.   Disposition:  Follow up in 3 month(s)  Signed, Nanetta Batty, MD  07/28/2018 4:48 PM     [facility]

## 2018-07-28 NOTE — Telephone Encounter (Signed)
Patient and/or DPR-approved person aware of AVS instructions and verbalized understanding. Letter including After Visit Summary and any other necessary documents to be mailed to the patient's address on file.  

## 2018-09-07 ENCOUNTER — Telehealth: Payer: Self-pay | Admitting: Neurology

## 2018-09-07 NOTE — Telephone Encounter (Signed)
Due to current COVID 19 pandemic, our office is severely reducing in office visits until further notice, in order to minimize the risk to our patients and healthcare providers.   Called patient regarding 6/11 appointment. Patient's voicemail is not set up. If patient calls back, please offer patient a virtual visit.

## 2018-09-09 ENCOUNTER — Encounter: Payer: Self-pay | Admitting: Neurology

## 2018-09-09 NOTE — Telephone Encounter (Signed)
Patient has been called twice regarding his 6/11 appt and I have been unable to make contact. Patient does not have a voicemail set up. I have cancelled patient's appointment and will send patient a letter stating this. If patient calls back please reschedule his appointment with Dr. Jannifer Franklin.

## 2018-09-10 ENCOUNTER — Ambulatory Visit: Payer: Self-pay | Admitting: Neurology

## 2018-09-14 ENCOUNTER — Other Ambulatory Visit: Payer: Self-pay

## 2018-09-14 ENCOUNTER — Encounter: Payer: Self-pay | Admitting: Neurology

## 2018-09-14 ENCOUNTER — Telehealth: Payer: Self-pay | Admitting: Neurology

## 2018-09-14 ENCOUNTER — Ambulatory Visit: Payer: Self-pay | Admitting: Neurology

## 2018-09-14 DIAGNOSIS — R9089 Other abnormal findings on diagnostic imaging of central nervous system: Secondary | ICD-10-CM

## 2018-09-14 NOTE — Progress Notes (Signed)
I have been unable to contact the patient by telephone today, he did not show for his virtual visit on Doxy-ME.  It does not appear that his telephone number is working properly.

## 2018-09-14 NOTE — Telephone Encounter (Signed)
This patient did not show for a new patient appointment today. 

## 2018-09-28 ENCOUNTER — Encounter (HOSPITAL_COMMUNITY): Payer: Self-pay | Admitting: Cardiovascular Disease

## 2018-09-29 ENCOUNTER — Ambulatory Visit (HOSPITAL_COMMUNITY)
Admission: EM | Admit: 2018-09-29 | Discharge: 2018-09-29 | Disposition: A | Discharge status: Home / Self Care | Payer: Managed Care, Other (non HMO) | Attending: Urgent Care | Admitting: Urgent Care

## 2018-09-29 ENCOUNTER — Other Ambulatory Visit: Payer: Self-pay

## 2018-09-29 ENCOUNTER — Encounter (HOSPITAL_COMMUNITY): Payer: Self-pay

## 2018-09-29 DIAGNOSIS — J019 Acute sinusitis, unspecified: Secondary | ICD-10-CM

## 2018-09-29 DIAGNOSIS — I10 Essential (primary) hypertension: Secondary | ICD-10-CM

## 2018-09-29 DIAGNOSIS — J3089 Other allergic rhinitis: Secondary | ICD-10-CM

## 2018-09-29 DIAGNOSIS — H5789 Other specified disorders of eye and adnexa: Secondary | ICD-10-CM

## 2018-09-29 MED ORDER — AMOXICILLIN 500 MG PO CAPS: 500.0000 mg | ORAL_CAPSULE | Freq: Three times a day (TID) | ORAL | 0 refills | Status: DC

## 2018-09-29 NOTE — ED Provider Notes (Signed)
MRN: [id number] DOB: [birth year]  Subjective:   John Savage is a 38 y.o. male presenting for 2-week history of progressively worsening moderate to severe sinus congestion now having sinus pain.  Patient also has had red and itchy eyes, intermittent bilateral ear pain and popping.  He has a history of bad allergies but does not take anything consistently for this.  Has not tried any medications for relief because he has high blood pressure and is unsure what to take.  He is consistent with his high blood pressure medicine.  No current facility-administered medications for this encounter.   Current Outpatient Medications:  .  amLODipine (NORVASC) 10 MG tablet, Take 1 tablet (10 mg total) by mouth daily., Disp: 90 tablet, Rfl: 2 .  carvedilol (COREG) 3.125 MG tablet, Take 1 tablet (3.125 mg total) by mouth 2 (two) times daily with a meal., Disp: 60 tablet, Rfl: 3 .  hydrochlorothiazide (HYDRODIURIL) 25 MG tablet, Take 1 tablet (25 mg total) by mouth daily., Disp: 90 tablet, Rfl: 2 .  lisinopril (PRINIVIL,ZESTRIL) 40 MG tablet, Take 1 tablet (40 mg total) by mouth daily., Disp: 90 tablet, Rfl: 2 .  amoxicillin (AMOXIL) 500 MG capsule, Take 1 capsule (500 mg total) by mouth 3 (three) times daily., Disp: 21 capsule, Rfl: 0   Allergies  Allergen Reactions  . Aspirin Other (See Comments)    Loose blood  . Novocain [Procaine] Other (See Comments)    unknown    Past Medical History:  Diagnosis Date  . Hypertension      History reviewed. No pertinent surgical history.  ROS Denies fever, ear drainage, throat pain, cough, chest pain, shortness of breath, wheezing, nausea, vomiting, belly pain, rashes.  Objective:   Vitals: BP 129/90 (BP Location: Right Arm)   Pulse 76   Temp 98.4 F (36.9 C) (Oral)   Resp 16   SpO2 98%   Physical Exam Constitutional:      General: He is not in acute distress.    Appearance: Normal appearance. He is normal weight. He is not ill-appearing.   HENT:     Head: Normocephalic and atraumatic.     Right Ear: Tympanic membrane, ear canal and external ear normal. There is no impacted cerumen.     Left Ear: Tympanic membrane, ear canal and external ear normal. There is no impacted cerumen.     Nose: Mucosal edema and congestion present. No nasal tenderness or rhinorrhea.     Mouth/Throat:     Mouth: Mucous membranes are moist.     Pharynx: Oropharynx is clear. No oropharyngeal exudate or posterior oropharyngeal erythema.  Eyes:     General: No scleral icterus.       Right eye: No discharge.        Left eye: No discharge.     Extraocular Movements: Extraocular movements intact.     Conjunctiva/sclera:     Right eye: Right conjunctiva is injected. No chemosis, exudate or hemorrhage.    Left eye: Left conjunctiva is injected. No chemosis, exudate or hemorrhage.    Pupils: Pupils are equal, round, and reactive to light.  Neck:     Musculoskeletal: Normal range of motion and neck supple. No neck rigidity or muscular tenderness.  Cardiovascular:     Rate and Rhythm: Normal rate.  Pulmonary:     Effort: Pulmonary effort is normal.  Neurological:     General: No focal deficit present.     Mental Status: He is alert and oriented to person, place, and  time.  Psychiatric:        Mood and Affect: Mood normal.        Behavior: Behavior normal.     Assessment and Plan :   1. Acute sinusitis, recurrence not specified, unspecified location   2. Allergic rhinitis due to other allergic trigger, unspecified seasonality   3. Redness of both eyes   4. Essential hypertension    Will cover for sinusitis with amoxicillin secondary to controlled allergies.  Emphasized need for patient to start taking his allergy medication consistently.  Maintain all other medications for his blood pressure. Counseled patient on potential for adverse effects with medications prescribed/recommended today, ER and return-to-clinic precautions discussed, patient  verbalized understanding.     Wallis BambergMani, Renold Kozar, [place] 09/29/18 [phone]

## 2018-09-29 NOTE — Discharge Instructions (Signed)
-   Start Allegra, Claritin or Zyrtec each day for daily management of your allergies. - Drink at least 64 ounces of water each day. - Remove as many irritants/allergies as you are able to, no pets in the bedroom, change air filters in air vents.

## 2018-09-29 NOTE — ED Triage Notes (Signed)
Patient presents to Urgent Care with complaints of sneezing and watery eyes and nose since 2 weeks ago, worse in the morning. Patient reports no sick contacts.

## 2018-09-30 ENCOUNTER — Telehealth: Payer: Self-pay | Admitting: *Deleted

## 2018-09-30 NOTE — Telephone Encounter (Signed)
-----   Message from Jaynee Eagles, [place] sent at 09/29/2018  6:50 PM EDT ----- Regarding: Patient needs covid testing

## 2018-09-30 NOTE — Telephone Encounter (Signed)
Called patient - advised him I was calling to schedule him for COVID 19 test.  Offered him an appointment today for COVID 19 test, but patient states he is not going to be able to go for test at all because he works from [employer] - 4:30 pm and cannot come in late or leave early.  Advised him, I will send message to his provider to lett them know.

## 2018-11-22 ENCOUNTER — Other Ambulatory Visit: Payer: Self-pay | Admitting: Family Medicine

## 2018-11-24 ENCOUNTER — Other Ambulatory Visit (INDEPENDENT_AMBULATORY_CARE_PROVIDER_SITE_OTHER): Payer: Self-pay | Admitting: Primary Care

## 2018-11-24 MED ORDER — CARVEDILOL 3.125 MG PO TABS: 3.1250 mg | ORAL_TABLET | Freq: Two times a day (BID) | ORAL | 1 refills | Status: DC

## 2018-12-09 ENCOUNTER — Other Ambulatory Visit: Payer: Self-pay

## 2018-12-09 ENCOUNTER — Ambulatory Visit (INDEPENDENT_AMBULATORY_CARE_PROVIDER_SITE_OTHER): Payer: Managed Care, Other (non HMO) | Admitting: Neurology

## 2018-12-09 ENCOUNTER — Encounter: Payer: Self-pay | Admitting: Neurology

## 2018-12-09 VITALS — BP 122/84 | HR 83 | Temp 98.0°F | Ht 65.0 in | Wt 178.1 lb

## 2018-12-09 DIAGNOSIS — R9089 Other abnormal findings on diagnostic imaging of central nervous system: Secondary | ICD-10-CM

## 2018-12-09 NOTE — Progress Notes (Signed)
Reason for visit: Abnormal MRI brain  [facility]  John Savage is a 38 y.o. male  History of present illness:  Mr. John Savage is a 38 year old right-handed black male with a history of severe uncontrolled hypertension.  The patient was seen and admitted to the hospital on 06 June 2018 with severe hypertension.  The patient was running blood pressure in the 219/133 range, he had given a history of headaches off and on for several months prior to this.  For this reason, MRI of the brain was done and showed evidence of mild deep white matter changes consistent with small vessel disease.  No acute changes were seen.  The patient was treated with multiple antihypertensive medications, he has gained control of his blood pressure and his headaches have gone away.  The patient reports no numbness or weakness of the face, arms, legs.  He denies any balance issues or difficulty controlling the bowels or the bladder.  He has not had any vision complaints.  He reports no dizziness.  He comes to this office for an evaluation.  Past Medical History:  Diagnosis Date  . Hypertension     History reviewed. No pertinent surgical history.  Family History  Problem Relation Age of Onset  . Healthy Mother   . Healthy Father     Social history:  reports that he has never smoked. He has never used smokeless tobacco. He reports that he does not drink alcohol or use drugs.  Medications:  Prior to Admission medications   Medication Sig Start Date End Date Taking? Authorizing Provider  amLODipine (NORVASC) 10 MG tablet Take 1 tablet (10 mg total) by mouth daily. 06/25/18  Yes Bing NeighborsHarris, Kimberly S, FNP  amoxicillin (AMOXIL) 500 MG capsule Take 1 capsule (500 mg total) by mouth 3 (three) times daily. 09/29/18  Yes Wallis BambergMani, Mario, PA-C  carvedilol (COREG) 3.125 MG tablet Take 1 tablet (3.125 mg total) by mouth 2 (two) times daily with a meal. 11/24/18  Yes Grayce SessionsEdwards, Michelle P, NP   hydrochlorothiazide (HYDRODIURIL) 25 MG tablet Take 1 tablet (25 mg total) by mouth daily. 06/23/18  Yes Bing NeighborsHarris, Kimberly S, FNP  lisinopril (PRINIVIL,ZESTRIL) 40 MG tablet Take 1 tablet (40 mg total) by mouth daily. 06/25/18 09/29/18  Bing NeighborsHarris, Kimberly S, FNP      Allergies  Allergen Reactions  . Aspirin Other (See Comments)    Loose blood  . Novocain [Procaine] Other (See Comments)    unknown    ROS:  Out of a complete 14 system review of symptoms, the patient complains only of the following symptoms, and all other reviewed systems are negative.  History of headache  Blood pressure 122/84, pulse 83, temperature 98 F (36.7 C), height 5\' 5"  (1.651 m), weight 178 lb 2 oz (80.8 kg), SpO2 95 %.  Physical Exam  General: The patient is alert and cooperative at the time of the examination.  The patient is moderately obese.  Eyes: Pupils are equal, round, and reactive to light. Discs are flat bilaterally.  Neck: The neck is supple, no carotid bruits are noted.  Respiratory: The respiratory examination is clear.  Cardiovascular: The cardiovascular examination reveals a regular rate and rhythm, no obvious murmurs or rubs are noted.  Skin: Extremities are without significant edema.  Neurologic Exam  Mental status: The patient is alert and oriented x 3 at the time of the examination. The patient has apparent normal recent and remote memory, with an apparently normal attention span and concentration  ability.  Cranial nerves: Facial symmetry is present. There is good sensation of the face to pinprick and soft touch bilaterally. The strength of the facial muscles and the muscles to head turning and shoulder shrug are normal bilaterally. Speech is well enunciated, no aphasia or dysarthria is noted. Extraocular movements are full. Visual fields are full. The tongue is midline, and the patient has symmetric elevation of the soft palate. No obvious hearing deficits are noted.  Motor: The motor  testing reveals 5 over 5 strength of all 4 extremities. Good symmetric motor tone is noted throughout.  Sensory: Sensory testing is intact to pinprick, soft touch, vibration sensation, and position sense on all 4 extremities. No evidence of extinction is noted.  Coordination: Cerebellar testing reveals good finger-nose-finger and heel-to-shin bilaterally.  Gait and station: Gait is normal. Tandem gait is normal. Romberg is negative. No drift is seen.  Reflexes: Deep tendon reflexes are symmetric and normal bilaterally. Toes are downgoing bilaterally.   MRI brain 06/06/18:  IMPRESSION: 1. Periventricular T2 hyperintensities are moderately advanced for age. The finding is nonspecific but can be seen in the setting of chronic microvascular ischemia, a demyelinating process such as multiple sclerosis, vasculitis, complicated migraine headaches, or as the sequelae of a prior infectious or inflammatory process. Given the patient's severe hypertension, this most likely represents small vessel disease. 2. No acute intracranial abnormality.  * MRI scan images were reviewed online. I agree with the written report.    Assessment/Plan:  1.  Abnormal MRI brain  2.  Severe hypertension  The patient has done much better with his blood pressures lately on 4 different antihypertensive medications.  The white matter changes seen by MRI is likely a result of severe uncontrolled hypertension.  He is to go on a low-dose aspirin, he is encouraged to get a blood pressure cuff to monitor his blood pressure at home.  I have suggested exercise and weight loss and avoidance of salt in the diet.  He will follow-up here as needed.  Jill Alexanders MD 12/09/2018 3:10 PM  [facility] [address] [place], [place] [address]  Phone [phone] Fax [phone]

## 2018-12-14 ENCOUNTER — Other Ambulatory Visit: Payer: Self-pay

## 2018-12-14 ENCOUNTER — Ambulatory Visit (HOSPITAL_COMMUNITY): Payer: Managed Care, Other (non HMO) | Attending: Cardiology

## 2018-12-14 DIAGNOSIS — I517 Cardiomegaly: Secondary | ICD-10-CM | POA: Diagnosis not present

## 2019-01-04 ENCOUNTER — Telehealth: Payer: Self-pay

## 2019-01-04 NOTE — Telephone Encounter (Signed)
Called patient to do their pre-visit COVID screening.  Have you tested positive for COVID or are you currently waiting for COVID test results? no  Have you recently traveled internationally([place], [place], [place], [place], [place]) or within the [place] to a hotspot area([place], [place], [place], [place], [place])? no  Are you currently experiencing any of the following symptoms: fever, cough, SHOB, fatigue, body aches, loss of smell/taste, rash, diarrhea, vomiting, severe headaches, weakness, sore throat? no  Have you been in contact with anyone who has recently travelled? no  Have you been in contact with anyone who is experiencing any of the above symptoms or been diagnosed with COVID  or works in or has recently visited a SNF? no  States that he needs a later appointment due to work schedule. Will call back to reschedule BP follow up.

## 2019-01-05 ENCOUNTER — Ambulatory Visit: Payer: Managed Care, Other (non HMO)

## 2019-01-14 ENCOUNTER — Ambulatory Visit (INDEPENDENT_AMBULATORY_CARE_PROVIDER_SITE_OTHER): Payer: Managed Care, Other (non HMO) | Admitting: Cardiovascular Disease

## 2019-01-14 ENCOUNTER — Other Ambulatory Visit: Payer: Self-pay

## 2019-01-14 ENCOUNTER — Encounter: Payer: Self-pay | Admitting: Cardiovascular Disease

## 2019-01-14 DIAGNOSIS — I517 Cardiomegaly: Secondary | ICD-10-CM

## 2019-01-14 DIAGNOSIS — I10 Essential (primary) hypertension: Secondary | ICD-10-CM | POA: Diagnosis not present

## 2019-01-14 NOTE — Progress Notes (Signed)
01/14/2019 John Savage   07-07-1980  [id number]  Primary Physician John Neighbors, FNP Primary Cardiologist: John Gess MD John Savage  HPI:  John Savage is a 38 y.o.   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  He was referred by his PCP, John Courts, FNP, for evaluation of hypertension and abnormal EKG. I last saw him for a telemedicine virtual video visit 07/28/2018. He is originally from [place].  Has been in the [place].  His only risk factor is hypertension.  There is no family history for heart disease.  Is never had a heart attack or stroke.  He denies chest pain or shortness of breath.  He was recently in the hospital in March for upper respiratory tract infection.  He was hypertensive at that time.  His medications were adjusted.  He is currently on lisinopril and hydrochlorothiazide.  He is unable to check his blood pressure at home because he does not have a cuff.  His EKG showed sinus rhythm with LVH and repolarization changes.  He currently is on amlodipine, low-dose carvedilol, hydrochlorothiazide and lisinopril with an excellent blood pressure.  A 2D echocardiogram performed 12/14/2018 showed mild left ventricular hypertrophy which is somewhat asymmetric and normal LV function.  He denies chest pain or shortness of breath.  He does avoid salt.   Current Meds  Medication Sig  . amLODipine (NORVASC) 10 MG tablet Take 1 tablet (10 mg total) by mouth daily.  . carvedilol (COREG) 3.125 MG tablet Take 1 tablet (3.125 mg total) by mouth 2 (two) times daily with a meal.  . hydrochlorothiazide (HYDRODIURIL) 25 MG tablet Take 1 tablet (25 mg total) by mouth daily.     Allergies  Allergen Reactions  . Aspirin Other (See Comments)    Loose blood  . Novocain [Procaine] Other (See Comments)    unknown    Social History   Socioeconomic History  . Marital status: Married    Spouse  name: Not on file  . Number of children: Not on file  . Years of education: Not on file  . Highest education level: Not on file  Occupational History  . Not on file  Social Needs  . Financial resource strain: Not on file  . Food insecurity    Worry: Not on file    Inability: Not on file  . Transportation needs    Medical: Not on file    Non-medical: Not on file  Tobacco Use  . Smoking status: Never Smoker  . Smokeless tobacco: Never Used  Substance and Sexual Activity  . Alcohol use: No  . Drug use: No  . Sexual activity: Not on file  Lifestyle  . Physical activity    Days per week: Not on file    Minutes per session: Not on file  . Stress: Not on file  Relationships  . Social Musician on phone: Not on file    Gets together: Not on file    Attends religious service: Not on file    Active member of club or organization: Not on file    Attends meetings of clubs or organizations: Not on file    Relationship status: Not on file  . Intimate partner violence    Fear of current or ex partner: Not on file    Emotionally abused: Not on file    Physically abused: Not on file  Forced sexual activity: Not on file  Other Topics Concern  . Not on file  Social History Narrative   Right handed   Lives at home with wife and kids     Review of Systems: General: negative for chills, fever, night sweats or weight changes.  Cardiovascular: negative for chest pain, dyspnea on exertion, edema, orthopnea, palpitations, paroxysmal nocturnal dyspnea or shortness of breath Dermatological: negative for rash Respiratory: negative for cough or wheezing Urologic: negative for hematuria Abdominal: negative for nausea, vomiting, diarrhea, bright red blood per rectum, melena, or hematemesis Neurologic: negative for visual changes, syncope, or dizziness All other systems reviewed and are otherwise negative except as noted above.    Blood pressure 110/74, pulse 69, temperature (!)  97.3 F (36.3 C), temperature source Temporal, height 5\' 5"  (1.651 m), weight 178 lb (80.7 kg), SpO2 96 %.  General appearance: alert and no distress Neck: no adenopathy, no carotid bruit, no JVD, supple, symmetrical, trachea midline and thyroid not enlarged, symmetric, no tenderness/mass/nodules Lungs: clear to auscultation bilaterally Heart: regular rate and rhythm, S1, S2 normal, no murmur, click, rub or gallop Extremities: extremities normal, atraumatic, no cyanosis or edema Pulses: 2+ and symmetric Skin: Skin color, texture, turgor normal. No rashes or lesions Neurologic: Alert and oriented X 3, normal strength and tone. Normal symmetric reflexes. Normal coordination and gait  EKG normal sinus rhythm at 69 with evidence of LVH with repolarization changes.  I personally reviewed this EKG  ASSESSMENT AND PLAN:   Severe hypertension History of essential hypertension with blood pressure measured today at 110/74.  He is on amlodipine, carvedilol, hydrochlorothiazide and lisinopril.  Left ventricular hypertrophy by electrocardiogram Recent 2D echo performed 12/14/2018 revealed mild asymmetric left ventricular hypertrophy with otherwise normal LV function.      John Harp MD FACP,FACC,FAHA, [facility] 01/14/2019 5:02 PM

## 2019-01-14 NOTE — Assessment & Plan Note (Signed)
History of essential hypertension with blood pressure measured today at 110/74.  He is on amlodipine, carvedilol, hydrochlorothiazide and lisinopril.

## 2019-01-14 NOTE — Patient Instructions (Signed)
Medication Instructions:  Your physician recommends that you continue on your current medications as directed. Please refer to the Current Medication list given to you today.  If you need a refill on your cardiac medications before your next appointment, please call your pharmacy.   Lab work: none If you have labs (blood work) drawn today and your tests are completely normal, you will receive your results only by: . MyChart Message (if you have MyChart) OR . A paper copy in the mail If you have any lab test that is abnormal or we need to change your treatment, we will call you to review the results.  Testing/Procedures: none  Follow-Up: At [facility], you and your health needs are our priority.  As part of our continuing mission to provide you with exceptional heart care, we have created designated Provider Care Teams.  These Care Teams include your primary Cardiologist (physician) and Advanced Practice Providers (APPs -  Physician Assistants and Nurse Practitioners) who all work together to provide you with the care you need, when you need it. . You will need a follow up appointment as needed. You may see Dr. Berry or one of the following Advanced Practice Providers on your designated Care Team:   . Luke Kilroy, PA-C . Krista Kroeger, PA-C . Callie Goodrich, PA-C . Hao Meng, PA-C . Angela Duke, PA-C . Kathryn Lawrence, DNP . Rhonda Barrett, PA-C    

## 2019-01-14 NOTE — Assessment & Plan Note (Signed)
Recent 2D echo performed 12/14/2018 revealed mild asymmetric left ventricular hypertrophy with otherwise normal LV function.

## 2019-05-11 ENCOUNTER — Telehealth: Payer: Self-pay

## 2019-05-11 NOTE — Telephone Encounter (Signed)
Called patient to do their pre-visit COVID screening.  Call went to voicemail. Unable to do prescreening.  

## 2019-05-12 ENCOUNTER — Ambulatory Visit: Payer: Managed Care, Other (non HMO) | Admitting: Internal Medicine

## 2019-12-10 ENCOUNTER — Ambulatory Visit
Admission: EM | Admit: 2019-12-10 | Discharge: 2019-12-10 | Discharge status: Against Medical Advice | Payer: Managed Care, Other (non HMO)

## 2019-12-11 ENCOUNTER — Ambulatory Visit (INDEPENDENT_AMBULATORY_CARE_PROVIDER_SITE_OTHER)
Admission: EM | Admit: 2019-12-11 | Discharge: 2019-12-11 | Disposition: A | Discharge status: Home / Self Care | Payer: Managed Care, Other (non HMO) | Source: Home / Self Care

## 2019-12-11 ENCOUNTER — Encounter: Payer: Self-pay | Admitting: Emergency Medicine

## 2019-12-11 ENCOUNTER — Inpatient Hospital Stay (HOSPITAL_COMMUNITY)
Admission: EM | Admit: 2019-12-11 | Discharge: 2019-12-15 | DRG: 281 | Disposition: A | Discharge status: Home / Self Care | Payer: Managed Care, Other (non HMO) | Attending: Internal Medicine | Admitting: Internal Medicine

## 2019-12-11 ENCOUNTER — Other Ambulatory Visit: Payer: Self-pay

## 2019-12-11 ENCOUNTER — Emergency Department (HOSPITAL_COMMUNITY): Payer: Managed Care, Other (non HMO)

## 2019-12-11 ENCOUNTER — Encounter (HOSPITAL_COMMUNITY): Payer: Self-pay | Admitting: Emergency Medicine

## 2019-12-11 DIAGNOSIS — Z9114 Patient's other noncompliance with medication regimen: Secondary | ICD-10-CM

## 2019-12-11 DIAGNOSIS — I161 Hypertensive emergency: Secondary | ICD-10-CM | POA: Insufficient documentation

## 2019-12-11 DIAGNOSIS — Z888 Allergy status to other drugs, medicaments and biological substances status: Secondary | ICD-10-CM

## 2019-12-11 DIAGNOSIS — R778 Other specified abnormalities of plasma proteins: Secondary | ICD-10-CM

## 2019-12-11 DIAGNOSIS — I169 Hypertensive crisis, unspecified: Secondary | ICD-10-CM | POA: Diagnosis not present

## 2019-12-11 DIAGNOSIS — Z886 Allergy status to analgesic agent status: Secondary | ICD-10-CM

## 2019-12-11 DIAGNOSIS — I248 Other forms of acute ischemic heart disease: Secondary | ICD-10-CM | POA: Diagnosis present

## 2019-12-11 DIAGNOSIS — R739 Hyperglycemia, unspecified: Secondary | ICD-10-CM | POA: Diagnosis present

## 2019-12-11 DIAGNOSIS — Z79899 Other long term (current) drug therapy: Secondary | ICD-10-CM

## 2019-12-11 DIAGNOSIS — N179 Acute kidney failure, unspecified: Secondary | ICD-10-CM | POA: Diagnosis present

## 2019-12-11 DIAGNOSIS — E876 Hypokalemia: Secondary | ICD-10-CM

## 2019-12-11 DIAGNOSIS — I214 Non-ST elevation (NSTEMI) myocardial infarction: Secondary | ICD-10-CM | POA: Diagnosis present

## 2019-12-11 DIAGNOSIS — Z23 Encounter for immunization: Secondary | ICD-10-CM

## 2019-12-11 DIAGNOSIS — I10 Essential (primary) hypertension: Secondary | ICD-10-CM | POA: Diagnosis present

## 2019-12-11 DIAGNOSIS — Z20822 Contact with and (suspected) exposure to covid-19: Secondary | ICD-10-CM | POA: Diagnosis present

## 2019-12-11 LAB — CBC
HCT: 42.1 % (ref 39.0–52.0)
Hemoglobin: 13.5 g/dL (ref 13.0–17.0)
MCH: 29.4 pg (ref 26.0–34.0)
MCHC: 32.1 g/dL (ref 30.0–36.0)
MCV: 91.7 fL (ref 80.0–100.0)
Platelets: 327 10*3/uL (ref 150–400)
RBC: 4.59 MIL/uL (ref 4.22–5.81)
RDW: 13.2 % (ref 11.5–15.5)
WBC: 7.7 10*3/uL (ref 4.0–10.5)
nRBC: 0 % (ref 0.0–0.2)

## 2019-12-11 LAB — BASIC METABOLIC PANEL
Anion gap: 13 (ref 5–15)
BUN: 15 mg/dL (ref 6–20)
CO2: 28 mmol/L (ref 22–32)
Calcium: 9.9 mg/dL (ref 8.9–10.3)
Chloride: 99 mmol/L (ref 98–111)
Creatinine, Ser: 1.42 mg/dL — ABNORMAL HIGH (ref 0.61–1.24)
GFR calc Af Amer: >60 mL/min (ref >60)
GFR calc non Af Amer: >60 mL/min (ref >60)
Glucose, Bld: 163 mg/dL — ABNORMAL HIGH (ref 70–99)
Potassium: 2.9 mmol/L — ABNORMAL LOW (ref 3.5–5.1)
Sodium: 140 mmol/L (ref 135–145)

## 2019-12-11 LAB — TROPONIN I (HIGH SENSITIVITY)
Troponin I (High Sensitivity): 44 ng/L — ABNORMAL HIGH (ref <18)
Troponin I (High Sensitivity): 40 ng/L — ABNORMAL HIGH (ref <18)
Troponin I (High Sensitivity): 54 ng/L — ABNORMAL HIGH (ref <18)

## 2019-12-11 LAB — SARS CORONAVIRUS 2 BY RT PCR (HOSPITAL ORDER, PERFORMED IN ~~LOC~~ HOSPITAL LAB): SARS Coronavirus 2: NEGATIVE

## 2019-12-11 LAB — RAPID URINE DRUG SCREEN, HOSP PERFORMED
Amphetamines: NOT DETECTED
Barbiturates: NOT DETECTED
Benzodiazepines: NOT DETECTED
Cocaine: NOT DETECTED
Opiates: NOT DETECTED
Tetrahydrocannabinol: NOT DETECTED

## 2019-12-11 LAB — CBG MONITORING, ED: Glucose-Capillary: 120 mg/dL — ABNORMAL HIGH (ref 70–99)

## 2019-12-11 MED ORDER — ACETAMINOPHEN 325 MG PO TABS
650.0000 mg | ORAL_TABLET | Freq: Four times a day (QID) | ORAL | Status: DC | PRN
  Administered 2019-12-12 – 2019-12-14 (×3): 650 mg via ORAL
  Filled 2019-12-11 (×3): qty 2

## 2019-12-11 MED ORDER — DM-GUAIFENESIN ER 30-600 MG PO TB12
2.0000 | ORAL_TABLET | Freq: Two times a day (BID) | ORAL | Status: AC
  Administered 2019-12-11 – 2019-12-14 (×6): 2 via ORAL
  Filled 2019-12-11 (×7): qty 2

## 2019-12-11 MED ORDER — ENOXAPARIN SODIUM 40 MG/0.4ML ~~LOC~~ SOLN
40.0000 mg | SUBCUTANEOUS | Status: DC
  Administered 2019-12-11 – 2019-12-14 (×4): 40 mg via SUBCUTANEOUS
  Filled 2019-12-11 (×4): qty 0.4

## 2019-12-11 MED ORDER — POTASSIUM CHLORIDE 2 MEQ/ML IV SOLN
INTRAVENOUS | Status: DC
  Filled 2019-12-11: qty 1000

## 2019-12-11 MED ORDER — ONDANSETRON HCL 4 MG/2ML IJ SOLN: 4.0000 mg | Freq: Four times a day (QID) | INTRAMUSCULAR | Status: DC | PRN

## 2019-12-11 MED ORDER — HYDRALAZINE HCL 20 MG/ML IJ SOLN
10.0000 mg | Freq: Four times a day (QID) | INTRAMUSCULAR | Status: DC | PRN
  Administered 2019-12-11 – 2019-12-14 (×2): 10 mg via INTRAVENOUS
  Filled 2019-12-11 (×2): qty 1

## 2019-12-11 MED ORDER — INSULIN ASPART 100 UNIT/ML ~~LOC~~ SOLN: 0.0000 [IU] | Freq: Every day | SUBCUTANEOUS | Status: DC

## 2019-12-11 MED ORDER — POTASSIUM CHLORIDE CRYS ER 20 MEQ PO TBCR
40.0000 meq | EXTENDED_RELEASE_TABLET | Freq: Once | ORAL | Status: AC
  Administered 2019-12-11: 40 meq via ORAL
  Filled 2019-12-11: qty 2

## 2019-12-11 MED ORDER — DM-GUAIFENESIN ER 30-600 MG PO TB12: 2.0000 | ORAL_TABLET | Freq: Two times a day (BID) | ORAL | Status: DC

## 2019-12-11 MED ORDER — AMLODIPINE BESYLATE 10 MG PO TABS
10.0000 mg | ORAL_TABLET | Freq: Every day | ORAL | Status: DC
  Administered 2019-12-11 – 2019-12-15 (×5): 10 mg via ORAL
  Filled 2019-12-11 (×2): qty 1
  Filled 2019-12-11 (×2): qty 2
  Filled 2019-12-11: qty 1

## 2019-12-11 MED ORDER — LABETALOL HCL 5 MG/ML IV SOLN
10.0000 mg | Freq: Once | INTRAVENOUS | Status: AC
  Administered 2019-12-11: 10 mg via INTRAVENOUS
  Filled 2019-12-11: qty 4

## 2019-12-11 MED ORDER — INSULIN ASPART 100 UNIT/ML ~~LOC~~ SOLN
0.0000 [IU] | Freq: Three times a day (TID) | SUBCUTANEOUS | Status: DC
  Administered 2019-12-12: 1 [IU] via SUBCUTANEOUS

## 2019-12-11 MED ORDER — LABETALOL HCL 5 MG/ML IV SOLN
20.0000 mg | Freq: Once | INTRAVENOUS | Status: AC
  Administered 2019-12-11: 20 mg via INTRAVENOUS
  Filled 2019-12-11: qty 4

## 2019-12-11 MED ORDER — CARVEDILOL 6.25 MG PO TABS
6.2500 mg | ORAL_TABLET | Freq: Two times a day (BID) | ORAL | Status: DC
  Administered 2019-12-11 – 2019-12-12 (×3): 6.25 mg via ORAL
  Filled 2019-12-11 (×2): qty 1
  Filled 2019-12-11: qty 2

## 2019-12-11 NOTE — ED Notes (Signed)
Notified PA of elevated BP's. No new orders at this time. Will continue to monitor.

## 2019-12-11 NOTE — ED Triage Notes (Addendum)
Pt sent from [facility] for hypertensive emergency and EKG changes.  Denies chest pain.  Reports headache x 2 days.  No neuro deficits.  Last took BP medication about 3 months ago.

## 2019-12-11 NOTE — ED Triage Notes (Signed)
Pt here for htn; pt sts hx of same but is not currently taking any meds; pt sts some HA at times

## 2019-12-11 NOTE — ED Provider Notes (Signed)
  Face-to-face evaluation   History: He presents for evaluation of high blood pressure.  He went to an urgent care earlier today for concern of headache, and being out of his medicine for 3 months.  Physical exam: Alert, calm and cooperative.  He is lucid.  No dysarthria or aphasia.  No respiratory distress.  No abdominal distention.  Medical screening examination/treatment/procedure(s) were conducted as a shared visit with non-physician practitioner(s) and myself.  I personally evaluated the patient during the encounter   Mancel Bale, MD 12/11/19 [phone]

## 2019-12-11 NOTE — ED Provider Notes (Signed)
[facility] Provider Note   CSN: [id number] Arrival date & time: 12/11/19  1529     History Chief Complaint  Patient presents with  . Hypertension  . Headache    John Savage is a 39 y.o. male with past medical history significant for severe hypertension, LVH, abnormal brain MRI who presents for evaluation of hypertension.  Patient states he has been out of his blood pressure medication for about 3 months.  He is not followed with PCP.  He was also seen by cardiology, Dr. Gery PrayBarry for this previously.  When patient was at the urgent care he was noted to be hypertensive.  They got an EKG which was abnormal.  Patient denies any chest pain, shortness of breath, lightheadedness, dizziness, nausea, vomiting, back pain, diaphoresis.  Patient states he has had a headache over the last 2 days.  Started gradually.  States he has history of "chronic headaches" and this feels like similar headache.  He denies any sudden onset thunderclap headache.  No facial droop, paresthesias, vision changes, weakness, neck pain, neck stiffness, fever, chills.  Patient states "I feel fine."  He denies any additional aggravating or alleviating factors.  He is not currently taking any medications at home.  History obtained from patient and past medical records.  No interpreter used.  Urgent care note patient noted to be hypertensive.  Had a nonfocal neuro exam.  Noted possible LVH and sent to the emergency department for abnormal EKG.  HPI     Past Medical History:  Diagnosis Date  . Hypertension     Patient Active Problem List   Diagnosis Date Noted  . Hypertensive emergency 12/11/2019  . Left ventricular hypertrophy by electrocardiogram 07/28/2018  . Acute upper respiratory infection 06/07/2018  . Headache 06/07/2018  . Abnormal brain MRI 06/07/2018  . Severe hypertension 06/06/2018    History reviewed. No pertinent surgical history.     Family History    Problem Relation Age of Onset  . Healthy Mother   . Healthy Father     Social History   Tobacco Use  . Smoking status: Never Smoker  . Smokeless tobacco: Never Used  Vaping Use  . Vaping Use: Never used  Substance Use Topics  . Alcohol use: No  . Drug use: No    Home Medications Prior to Admission medications   Medication Sig Start Date End Date Taking? Authorizing Provider  ibuprofen (ADVIL) 200 MG tablet Take 400 mg by mouth every 6 (six) hours as needed (pain/headache).   Yes [provider]  amLODipine (NORVASC) 10 MG tablet Take 1 tablet (10 mg total) by mouth daily. Patient not taking: Reported on 12/11/2019 06/25/18   Bing NeighborsHarris, Kimberly S, FNP  carvedilol (COREG) 3.125 MG tablet Take 1 tablet (3.125 mg total) by mouth 2 (two) times daily with a meal. Patient not taking: Reported on 12/11/2019 11/24/18   Grayce SessionsEdwards, Michelle P, NP  hydrochlorothiazide (HYDRODIURIL) 25 MG tablet Take 1 tablet (25 mg total) by mouth daily. Patient not taking: Reported on 12/11/2019 06/23/18   Bing NeighborsHarris, Kimberly S, FNP  lisinopril (PRINIVIL,ZESTRIL) 40 MG tablet Take 1 tablet (40 mg total) by mouth daily. Patient not taking: Reported on 12/11/2019 06/25/18 09/29/18  Bing NeighborsHarris, Kimberly S, FNP    Allergies    Aspirin and Novocain [procaine]  Review of Systems   Review of Systems  Constitutional: Negative.   HENT: Negative.   Respiratory: Negative.   Cardiovascular: Negative.   Gastrointestinal: Negative.   Genitourinary: Negative.  Musculoskeletal: Negative.   Skin: Negative.   Neurological: Positive for headaches. Negative for dizziness, tremors, seizures, syncope, facial asymmetry, speech difficulty, weakness, light-headedness and numbness.  All other systems reviewed and are negative.   Physical Exam Updated Vital Signs BP (!) 195/136   Pulse 85   Temp 98.3 F (36.8 C) (Oral)   Resp (!) 23   Ht 5\' 5"  (1.651 m)   Wt 84.4 kg   SpO2 98%   BMI 30.95 kg/m   Physical Exam   Physical Exam  Constitutional: Pt is oriented to person, place, and time. Pt appears well-developed and well-nourished. No distress.  HENT:  Head: Normocephalic and atraumatic.  Mouth/Throat: Oropharynx is clear and moist.  Eyes: Conjunctivae and EOM are normal. Pupils are equal, round, and reactive to light. No scleral icterus.  No horizontal, vertical or rotational nystagmus  Neck: Normal range of motion. Neck supple.  Full active and passive ROM without pain No midline or paraspinal tenderness No nuchal rigidity or meningeal signs  Cardiovascular: Normal rate, regular rhythm and intact distal pulses.   Pulmonary/Chest: Effort normal and breath sounds normal. No respiratory distress. Pt has no wheezes. No rales.  2+ DP,PT, radial pulses bilaterally Abdominal: Soft. Bowel sounds are normal. There is no tenderness. There is no rebound and no guarding.  Musculoskeletal: Normal range of motion.  Moves all 4 extremities without difficulty.  Compartments soft.  ' sign negative.  No unilateral leg swelling, redness or warmth. Lymphadenopathy:    No cervical adenopathy.  Neurological: Pt. is alert and oriented to person, place, and time. He has normal reflexes. No cranial nerve deficit.  Exhibits normal muscle tone. Coordination normal.  Mental Status:  Alert, oriented, thought content appropriate. Speech fluent without evidence of aphasia. Able to follow 2 step commands without difficulty.  Cranial Nerves:  II:  Peripheral visual fields grossly normal, pupils equal, round, reactive to light III,IV, VI: ptosis not present, extra-ocular motions intact bilaterally  V,VII: smile symmetric, facial light touch sensation equal VIII: hearing grossly normal bilaterally  IX,X: midline uvula rise  XI: bilateral shoulder shrug equal and strong XII: midline tongue extension  Motor:  5/5 in upper and lower extremities bilaterally including strong and equal grip strength and dorsiflexion/plantar  flexion Sensory: Pinprick and light touch normal in all extremities.  Deep Tendon Reflexes: 2+ and symmetric  Cerebellar: normal finger-to-nose with bilateral upper extremities Gait: normal gait and balance CV: distal pulses palpable throughout   Skin: Skin is warm and dry. No rash noted. Pt is not diaphoretic.  Psychiatric: Pt has a normal mood and affect. Behavior is normal. Judgment and thought content normal.  Nursing note and vitals reviewed. ED Results / Procedures / Treatments   Labs (all labs ordered are listed, but only abnormal results are displayed) Labs Reviewed  BASIC METABOLIC PANEL - Abnormal; Notable for the following components:      Result Value   Potassium 2.9 (*)    Glucose, Bld 163 (*)    Creatinine, Ser 1.42 (*)    All other components within normal limits  TROPONIN I (HIGH SENSITIVITY) - Abnormal; Notable for the following components:   Troponin I (High Sensitivity) 44 (*)    All other components within normal limits  TROPONIN I (HIGH SENSITIVITY) - Abnormal; Notable for the following components:   Troponin I (High Sensitivity) 40 (*)    All other components within normal limits  TROPONIN I (HIGH SENSITIVITY) - Abnormal; Notable for the following components:   Troponin I (  High Sensitivity) 54 (*)    All other components within normal limits  SARS CORONAVIRUS 2 BY RT PCR (HOSPITAL ORDER, PERFORMED IN [place] [facility])  CBC  RAPID URINE DRUG SCREEN, HOSP PERFORMED    EKG EKG Interpretation  Date/Time:  Saturday December 11 2019 17:02:32 EDT Ventricular Rate:  106 PR Interval:    QRS Duration: 81 QT Interval:  342 QTC Calculation: 455 R Axis:   26 Text Interpretation: Sinus tachycardia Probable LVH with secondary repol abnrm Inferior infarct, age indeterminate Baseline wander in lead(s) V3 V4 since last tracing no significant change Confirmed by Mancel Bale [phone]) on 12/11/2019 7:06:16 PM   Radiology DG Chest 2 View  Result Date:  12/11/2019 CLINICAL DATA:  Elevated blood pressure in EKG changes EXAM: CHEST - 2 VIEW COMPARISON:  June 06, 2018 FINDINGS: Trachea midline. Cardiomediastinal contours and hilar structures are accentuated by slightly low lung volumes and portable technique. Retrocardiac opacity behind the RIGHT heart border compatible with lower lobe process partially obscuring the medial aspect of the RIGHT hemidiaphragm and also seen on the lateral view. On limited assessment skeletal structures without acute process. IMPRESSION: Suspect RIGHT lower lobe airspace disease/pneumonia. Small lesion in the lung behind the RIGHT heart is also considered. Follow-up is suggested to ensure resolution. Electronically Signed   By: Donzetta Kohut M.D.   On: 12/11/2019 18:05   CT Head Wo Contrast  Result Date: 12/11/2019 CLINICAL DATA:  39 year old presenting with acute hypertension and 2 day history of headache. EXAM: CT HEAD WITHOUT CONTRAST TECHNIQUE: Contiguous axial images were obtained from the base of the skull through the vertex without intravenous contrast. COMPARISON:  CT head and MRI brain 06/06/2018. FINDINGS: Brain: Ventricular system normal in size and appearance for age. Mild changes of small vessel disease of the white matter diffusely, unchanged from the prior CT and MRI. No mass lesion. No midline shift. No acute hemorrhage or hematoma. No extra-axial fluid collections. No evidence of acute infarction. Vascular: No hyperdense vessel.  No visible atherosclerosis. Skull: No skull fracture or other focal osseous abnormality involving the skull. Sinuses/Orbits: Visualized paranasal sinuses, bilateral mastoid air cells and bilateral middle ear cavities well-aerated. Visualized orbits and globes normal in appearance. Other: None. IMPRESSION: 1. No acute intracranial abnormality. 2. Stable mild chronic microvascular ischemic changes of the white matter. Electronically Signed   By: Hulan Saas M.D.   On: 12/11/2019 18:08     Procedures .Critical Care Performed by: Linwood Dibbles, PA-C Authorized by: Linwood Dibbles, PA-C   Critical care provider statement:    Critical care time (minutes):  35   Critical care was necessary to treat or prevent imminent or life-threatening deterioration of the following conditions:  Circulatory failure   Critical care was time spent personally by me on the following activities:  Discussions with consultants, evaluation of patient's response to treatment, examination of patient, ordering and performing treatments and interventions, ordering and review of laboratory studies, ordering and review of radiographic studies, pulse oximetry, re-evaluation of patient's condition, obtaining history from patient or surrogate and review of old charts   (including critical care time)  Medications Ordered in ED Medications  labetalol (NORMODYNE) injection 10 mg (10 mg Intravenous Given 12/11/19 1820)  potassium chloride SA (KLOR-CON) CR tablet 40 mEq (40 mEq Oral Given 12/11/19 1822)  labetalol (NORMODYNE) injection 10 mg (10 mg Intravenous Given 12/11/19 1941)  labetalol (NORMODYNE) injection 20 mg (20 mg Intravenous Given 12/11/19 2037)    ED Course  I  have reviewed the triage vital signs and the nursing notes.  Pertinent labs & imaging results that were available during my care of the patient were reviewed by me and considered in my medical decision making (see chart for details).  39 year old presents for evaluation of hypertension.  He is afebrile, nonseptic, not ill-appearing.  Has been out of his blood pressure medication x3 months.  Was previously followed by Dr. Gery Pray with cardiology for this.  He does not know what medications he was on however states he was on 4 of them.  Apparently patient had abnormal EKG at urgent care.  He denies any chest pain, shortness of breath, diaphoresis nausea or vomiting.  He does admit to a right-sided headache which he states he has chronic  headaches.  He has no sudden onset thunderclap headache.  Patient has a nonfocal neuro exam without deficits.  He appears overall well.  Ambulatory with out difficulty.  He is significantly hypertensive on arrival to the emergency department.  Plan on labs, imaging, reassess.  If imaging without significant findings plan on treating his hypertension.  Labs and imaging personally reviewed and interpreted:  CBC without leukocytosis, Hgb 13.5 BMP with hypokalemia at 2.9, replaced orally, hyperglycemia at 163, creatinine at 1.42 previous 1.08 Trop 44>>54 no prior to compare CT head WO acute changes DG chest with possible infiltrate, cardiomegaly, pleural edema, pneumothorax.  Without any upper respiratory complaints, no chest pain, shortness of breath, cough, congestion, rhinorrhea.  He is not vaccinated. EKG without STEMI  Patient reassessed. Continues  need elevated blood pressure despite labetalol.  Will order additional medication  Patient reassessed.  Persistently elevated blood pressures.  Additional labetalol ordered  Patient with elevated troponins however denies chest pain, shortness of breath.  EKG without ST elevation. Low suspicion for acute ACS, PE, dissection. PERC negative. He still has persistently elevated blood pressures despite 2 rounds of IV antihypertensive.  Will admit for hypertensive crisis.  Additional IV medication ordered for blood pressure.  Patient continues have nonfocal neuro exam and appears nontoxic. Presentation non concerning for [facility], ICH, dissection, CVA, Meningitis, or temporal arteritis. Pt is afebrile with no focal neuro deficits, nuchal rigidity, or change in vision.   CONSULT with Dr. Margo Aye with [facility] who will evaluate patient for admission. Request UDS added, orders placed. COVID pending on admission.  Patient seen eval by attending, Dr. Effie Shy who agrees with treatment, plan and disposition.    MDM Rules/Calculators/A&P                           Final  Clinical Impression(s) / ED Diagnoses Final diagnoses:  Hypertensive crisis  Elevated troponin  AKI (acute kidney injury) (HCC)  Hyperglycemia  Hypokalemia    Rx / DC Orders ED Discharge Orders    None       Bethsaida Siegenthaler A, PA-C 12/11/19 2056    Mancel Bale, MD 12/11/19 [phone]

## 2019-12-11 NOTE — H&P (Addendum)
History and Physical  John Savage UXN:[id number] DOB: Aug 28, 1980 DOA: 12/11/2019  Referring physician: Odette Fraction, PA-EDP PCP: Bing Neighbors, FNP  Outpatient Specialists: Cardiology Patient coming from: From home to urgent care to [facility] ED.  Chief Complaint: Recurrent headaches for 3 days  HPI: John Savage is a 39 y.o. male with medical history significant for essential hypertension, medication noncompliance who presented to [facility] ED from urgent care due to uncontrolled hypertension with SBP in the 230s.  Initially went to urgent care due to recurrent moderate headaches of 3 days duration, mainly located in the right side of the top of his head, no visual, motor, or sensory deficits.  Took over-the-counter Tylenol with mild improvement.  Associated with a nonproductive cough of 3 days duration.  His headache is worsened when he coughs.  While at urgent care his blood pressure was significantly elevated, had an abnormal twelve-lead EKG and was recommended to come to the ED for further evaluation.  Denies chest pain, dyspnea, or palpitation.  Ran out of his blood pressure medications 6 months ago.  Has been vaccinated for COVID-19 with [employer] vaccine in May 2021.  ED Course: SBP 231, DBP 139.  Lab studies remarkable for elevated troponin S, peaked at 54.  Denies any anginal symptoms.  Creatinine 1.42 with baseline of 1.0.  Potassium 2.9, elevated serum glucose 163.  TRH asked admit.  Review of Systems: Review of systems as noted in the HPI. All other systems reviewed and are negative.   Past Medical History:  Diagnosis Date  . Hypertension    History reviewed. No pertinent surgical history.  Social History:  reports that he has never smoked. He has never used smokeless tobacco. He reports that he does not drink alcohol and does not use drugs.   Allergies  Allergen Reactions  . Aspirin Other (See Comments)    Unknown reaction  . Novocain [Procaine] Other  (See Comments)    Unknown reaction    Family History  Problem Relation Age of Onset  . Healthy Mother   . Healthy Father       Prior to Admission medications   Medication Sig Start Date End Date Taking? Authorizing Provider  ibuprofen (ADVIL) 200 MG tablet Take 400 mg by mouth every 6 (six) hours as needed (pain/headache).   Yes [provider]  amLODipine (NORVASC) 10 MG tablet Take 1 tablet (10 mg total) by mouth daily. Patient not taking: Reported on 12/11/2019 06/25/18   Bing Neighbors, FNP  carvedilol (COREG) 3.125 MG tablet Take 1 tablet (3.125 mg total) by mouth 2 (two) times daily with a meal. Patient not taking: Reported on 12/11/2019 11/24/18   Grayce Sessions, NP  hydrochlorothiazide (HYDRODIURIL) 25 MG tablet Take 1 tablet (25 mg total) by mouth daily. Patient not taking: Reported on 12/11/2019 06/23/18   Bing Neighbors, FNP  lisinopril (PRINIVIL,ZESTRIL) 40 MG tablet Take 1 tablet (40 mg total) by mouth daily. Patient not taking: Reported on 12/11/2019 06/25/18 09/29/18  Bing Neighbors, FNP    Physical Exam: BP (!) 195/136   Pulse 85   Temp 98.3 F (36.8 C) (Oral)   Resp (!) 23   Ht 5\' 5"  (1.651 m)   Wt 84.4 kg   SpO2 98%   BMI 30.95 kg/m   . General: 39 y.o. year-old male well developed well nourished in no acute distress.  Alert and oriented x3. . Cardiovascular: Regular rate and rhythm with no rubs or gallops.  No thyromegaly or  JVD noted.  No lower extremity edema. 2/4 pulses in all 4 extremities. Marland Kitchen Respiratory: Clear to auscultation with no wheezes or rales. Good inspiratory effort. . Abdomen: Soft nontender nondistended with normal bowel sounds x4 quadrants. . Muskuloskeletal: No cyanosis, clubbing or edema noted bilaterally . Neuro: CN II-XII intact, strength, sensation, reflexes . Skin: No ulcerative lesions noted or rashes . Psychiatry: Judgement and insight appear normal. Mood is appropriate for condition and setting          Labs  on Admission:  Basic Metabolic Panel: Recent Labs  Lab 12/11/19 1621  NA 140  K 2.9*  CL 99  CO2 28  GLUCOSE 163*  BUN 15  CREATININE 1.42*  CALCIUM 9.9   Liver Function Tests: No results for input(s): AST, ALT, ALKPHOS, BILITOT, PROT, ALBUMIN in the last 168 hours. No results for input(s): LIPASE, AMYLASE in the last 168 hours. No results for input(s): AMMONIA in the last 168 hours. CBC: Recent Labs  Lab 12/11/19 1621  WBC 7.7  HGB 13.5  HCT 42.1  MCV 91.7  PLT 327   Cardiac Enzymes: No results for input(s): CKTOTAL, CKMB, CKMBINDEX, TROPONINI in the last 168 hours.  BNP (last 3 results) No results for input(s): BNP in the last 8760 hours.  ProBNP (last 3 results) No results for input(s): PROBNP in the last 8760 hours.  CBG: No results for input(s): GLUCAP in the last 168 hours.  Radiological Exams on Admission: DG Chest 2 View  Result Date: 12/11/2019 CLINICAL DATA:  Elevated blood pressure in EKG changes EXAM: CHEST - 2 VIEW COMPARISON:  June 06, 2018 FINDINGS: Trachea midline. Cardiomediastinal contours and hilar structures are accentuated by slightly low lung volumes and portable technique. Retrocardiac opacity behind the RIGHT heart border compatible with lower lobe process partially obscuring the medial aspect of the RIGHT hemidiaphragm and also seen on the lateral view. On limited assessment skeletal structures without acute process. IMPRESSION: Suspect RIGHT lower lobe airspace disease/pneumonia. Small lesion in the lung behind the RIGHT heart is also considered. Follow-up is suggested to ensure resolution. Electronically Signed   By: Donzetta Kohut M.D.   On: 12/11/2019 18:05   CT Head Wo Contrast  Result Date: 12/11/2019 CLINICAL DATA:  40 year old presenting with acute hypertension and 2 day history of headache. EXAM: CT HEAD WITHOUT CONTRAST TECHNIQUE: Contiguous axial images were obtained from the base of the skull through the vertex without intravenous  contrast. COMPARISON:  CT head and MRI brain 06/06/2018. FINDINGS: Brain: Ventricular system normal in size and appearance for age. Mild changes of small vessel disease of the white matter diffusely, unchanged from the prior CT and MRI. No mass lesion. No midline shift. No acute hemorrhage or hematoma. No extra-axial fluid collections. No evidence of acute infarction. Vascular: No hyperdense vessel.  No visible atherosclerosis. Skull: No skull fracture or other focal osseous abnormality involving the skull. Sinuses/Orbits: Visualized paranasal sinuses, bilateral mastoid air cells and bilateral middle ear cavities well-aerated. Visualized orbits and globes normal in appearance. Other: None. IMPRESSION: 1. No acute intracranial abnormality. 2. Stable mild chronic microvascular ischemic changes of the white matter. Electronically Signed   By: Hulan Saas M.D.   On: 12/11/2019 18:08    EKG: I independently viewed the EKG done and my findings are as followed: Sinus tachycardia rate of 106, ST depression V4 V5 V6.  Assessment/Plan Present on Admission: . Hypertensive emergency  Active Problems:   Hypertensive emergency  Hypertensive emergency Presented with SBP 231 and elevated troponin  S, peaked at 54 and elevated creatinine 1.4 with baseline creatinine of 1.0 Ran out of his home blood pressure medications 6 months ago Resume home antihypertensives, hold off lisinopril and HCTZ due to AKI. IV hydralazine as needed with parameters Obtain UDS  Recurrent moderate headache suspect secondary to uncontrolled hypertension Cough, nonproductive Initially presented to urgent care with recurrent headaches of 3-day duration without neurological changes, describes as 5-6 out of 10 and mainly located on the right side of the top of his head. CT head negative for any acute intracranial findings Analgesics as needed Antitussives as needed Personally reviewed chest x-ray, no clear evidence of lobular  infiltrates to suggest pneumonia, afebrile with no leukocytosis, continue to monitor off antibiotics.  Elevated troponin, suspect demand ischemia Troponin peaked at 54 Denies any anginal symptoms Reviewed twelve-lead EKG which shows ST depression in V4 V5 V6 Cycle troponin S x2 Repeat twelve-lead EKG Obtained 2D echo Continue to closely monitor  AKI Baseline creatinine appears to be 1.0 with GFR greater than 60 Presented with creatinine of 1.42 Start LR KCl at 50 cc/h x 1 day. Avoid nephrotoxins Monitor urine output Repeat BMP in the morning  Hyperglycemia Presented with serum glucose 163 Obtain A1c Insulin sliding scale  Hypokalemia Presented with serum potassium 2.9 Replete as indicated Obtain magnesium level   DVT prophylaxis: Subcu Lovenox daily  Code Status: Full code as stated by the patient himself  Family Communication: None at bedside  Disposition Plan: Admit to telemetry medical  Consults called: None  Admission status: Observation status.   Status is: Observation   Dispo:  Patient From: Home  Planned Disposition: Home  Expected discharge date: 12/12/19  Medically stable for discharge: No, ongoing management of hypertensive emergency.   Darlin Drop MD [facility] Pager [phone]  If 7PM-7AM, please contact night-coverage [email or web address] Password [facility]  12/11/2019, 8:47 PM

## 2019-12-11 NOTE — ED Provider Notes (Signed)
[facility]    CSN: [id number] Arrival date & time: 12/11/19  1011      History   Chief Complaint Chief Complaint  Patient presents with  . Hypertension    HPI John Savage is a 39 y.o. male  Presenting for elevated blood pressure readings, intermittent headaches.  Denies visual changes, thunderclap headache, worse headache of life.  Does have mild headache today in office.  Denies chest pain, difficulty breathing, abdominal pain, change in urinary or bowel habit, lower leg swelling.  States he has been out of his blood pressure medications "for a long time ".  Past Medical History:  Diagnosis Date  . Hypertension     Patient Active Problem List   Diagnosis Date Noted  . Left ventricular hypertrophy by electrocardiogram 07/28/2018  . Acute upper respiratory infection 06/07/2018  . Headache 06/07/2018  . Abnormal brain MRI 06/07/2018  . Severe hypertension 06/06/2018    History reviewed. No pertinent surgical history.     Home Medications    Prior to Admission medications   Medication Sig Start Date End Date Taking? Authorizing Provider  amLODipine (NORVASC) 10 MG tablet Take 1 tablet (10 mg total) by mouth daily. Patient not taking: Reported on 12/11/2019 06/25/18   Bing Neighbors, FNP  carvedilol (COREG) 3.125 MG tablet Take 1 tablet (3.125 mg total) by mouth 2 (two) times daily with a meal. Patient not taking: Reported on 12/11/2019 11/24/18   Grayce Sessions, NP  hydrochlorothiazide (HYDRODIURIL) 25 MG tablet Take 1 tablet (25 mg total) by mouth daily. Patient not taking: Reported on 12/11/2019 06/23/18   Bing Neighbors, FNP  lisinopril (PRINIVIL,ZESTRIL) 40 MG tablet Take 1 tablet (40 mg total) by mouth daily. Patient not taking: Reported on 12/11/2019 06/25/18 09/29/18  Bing Neighbors, FNP    Family History Family History  Problem Relation Age of Onset  . Healthy Mother   . Healthy Father     Social History Social History    Tobacco Use  . Smoking status: Never Smoker  . Smokeless tobacco: Never Used  Vaping Use  . Vaping Use: Never used  Substance Use Topics  . Alcohol use: No  . Drug use: No     Allergies   Aspirin and Novocain [procaine]   Review of Systems As per HPI   Physical Exam Triage Vital Signs ED Triage Vitals [12/11/19 1123]  Enc Vitals Group     BP (!) 231/139     Pulse Rate 95     Resp 18     Temp 98.6 F (37 C)     Temp Source Oral     SpO2 95 %     Weight      Height      Head Circumference      Peak Flow      Pain Score      Pain Loc      Pain Edu?      Excl. in GC?    No data found.  Updated Vital Signs BP (!) 231/139 (BP Location: Left Arm) Comment: notified nurse and provider  Pulse 95   Temp 98.6 F (37 C) (Oral)   Resp 18   SpO2 95%   Visual Acuity Right Eye Distance:   Left Eye Distance:   Bilateral Distance:    Right Eye Near:   Left Eye Near:    Bilateral Near:     Physical Exam Constitutional:      General: He is not  in acute distress.    Comments: Appears tired  HENT:     Head: Normocephalic and atraumatic.     Mouth/Throat:     Mouth: Mucous membranes are moist.     Pharynx: Oropharynx is clear.  Eyes:     General: No scleral icterus.    Pupils: Pupils are equal, round, and reactive to light.  Neck:     Comments: Trachea midline, negative JVD Cardiovascular:     Rate and Rhythm: Normal rate and regular rhythm.  Pulmonary:     Effort: Pulmonary effort is normal. No respiratory distress.     Breath sounds: No wheezing or rales.  Abdominal:     Palpations: Abdomen is soft.     Tenderness: There is no abdominal tenderness.  Skin:    Coloration: Skin is not jaundiced or pale.  Neurological:     Mental Status: He is alert and oriented to person, place, and time.      UC Treatments / Results  Labs (all labs ordered are listed, but only abnormal results are displayed) Labs Reviewed - No data to display  EKG    Radiology No results found.  Procedures Procedures (including critical care time)  Medications Ordered in UC Medications - No data to display  Initial Impression / Assessment and Plan / UC Course  I have reviewed the triage vital signs and the nursing notes.  Pertinent labs & imaging results that were available during my care of the patient were reviewed by me and considered in my medical decision making (see chart for details).     Patient is hypertensive, though denying signs/symptoms of endorgan damage as mentioned in HPI.  EKG done in office, reviewed by me compared to previous from 01/14/2019: NSR with ventricular rate 88 bpm.  Does have QT prolongation with QTC 488 ms.  Also significant for inferior and anterolateral ischemia.  Amplitude of QRS has increased as well.  Given subtle EKG changes with significantly elevated BP readings, intermittent headaches, and chronic noncompliance of medications, patient referred to ER for further evaluation/management of hypertensive emergency.  Patient actively denying chest pain, electing to self transport and personal vehicle to [facility]. Final Clinical Impressions(s) / UC Diagnoses   Final diagnoses:  Hypertensive emergency   Discharge Instructions   None    ED Prescriptions    None     PDMP not reviewed this encounter.   Hall-Potvin, [place], PA-C 12/11/19 1210

## 2019-12-12 ENCOUNTER — Observation Stay (HOSPITAL_BASED_OUTPATIENT_CLINIC_OR_DEPARTMENT_OTHER): Payer: Managed Care, Other (non HMO)

## 2019-12-12 DIAGNOSIS — I350 Nonrheumatic aortic (valve) stenosis: Secondary | ICD-10-CM | POA: Diagnosis not present

## 2019-12-12 DIAGNOSIS — I161 Hypertensive emergency: Secondary | ICD-10-CM | POA: Diagnosis not present

## 2019-12-12 LAB — BASIC METABOLIC PANEL
Anion gap: 11 (ref 5–15)
BUN: 10 mg/dL (ref 6–20)
CO2: 22 mmol/L (ref 22–32)
Calcium: 9.5 mg/dL (ref 8.9–10.3)
Chloride: 106 mmol/L (ref 98–111)
Creatinine, Ser: 1.21 mg/dL (ref 0.61–1.24)
GFR calc Af Amer: >60 mL/min (ref >60)
GFR calc non Af Amer: >60 mL/min (ref >60)
Glucose, Bld: 125 mg/dL — ABNORMAL HIGH (ref 70–99)
Potassium: 3.8 mmol/L (ref 3.5–5.1)
Sodium: 139 mmol/L (ref 135–145)

## 2019-12-12 LAB — CBC
HCT: 44.3 % (ref 39.0–52.0)
Hemoglobin: 13.9 g/dL (ref 13.0–17.0)
MCH: 29.2 pg (ref 26.0–34.0)
MCHC: 31.4 g/dL (ref 30.0–36.0)
MCV: 93.1 fL (ref 80.0–100.0)
Platelets: 325 10*3/uL (ref 150–400)
RBC: 4.76 MIL/uL (ref 4.22–5.81)
RDW: 13.2 % (ref 11.5–15.5)
WBC: 8 10*3/uL (ref 4.0–10.5)
nRBC: 0 % (ref 0.0–0.2)

## 2019-12-12 LAB — ECHOCARDIOGRAM COMPLETE
Area-P 1/2: 4.06 cm2
Height: 65 in
S' Lateral: 3.1 cm
Weight: 2976 oz

## 2019-12-12 LAB — GLUCOSE, CAPILLARY: Glucose-Capillary: 98 mg/dL (ref 70–99)

## 2019-12-12 LAB — TROPONIN I (HIGH SENSITIVITY)
Troponin I (High Sensitivity): 59 ng/L — ABNORMAL HIGH (ref <18)
Troponin I (High Sensitivity): 35 ng/L — ABNORMAL HIGH (ref <18)

## 2019-12-12 LAB — CBG MONITORING, ED: Glucose-Capillary: 125 mg/dL — ABNORMAL HIGH (ref 70–99)

## 2019-12-12 LAB — MAGNESIUM: Magnesium: 1.9 mg/dL (ref 1.7–2.4)

## 2019-12-12 LAB — HEMOGLOBIN A1C
Hgb A1c MFr Bld: 5.1 % (ref 4.8–5.6)
Mean Plasma Glucose: 99.67 mg/dL

## 2019-12-12 MED ORDER — HYDRALAZINE HCL 10 MG PO TABS
10.0000 mg | ORAL_TABLET | Freq: Three times a day (TID) | ORAL | Status: DC
  Administered 2019-12-12 – 2019-12-13 (×4): 10 mg via ORAL
  Filled 2019-12-12 (×4): qty 1

## 2019-12-12 MED ORDER — HYDROCHLOROTHIAZIDE 25 MG PO TABS
25.0000 mg | ORAL_TABLET | Freq: Every day | ORAL | Status: DC
  Administered 2019-12-12: 25 mg via ORAL
  Filled 2019-12-12: qty 1

## 2019-12-12 NOTE — Progress Notes (Addendum)
PROGRESS NOTE    John Savage  ZOX:[id number] DOB: 1980-07-17 DOA: 12/11/2019 PCP: Bing NeighborsHarris, Kimberly S, FNP     Brief Narrative:  John Savage is a 39 y.o. male with medical history significant for essential hypertension, medication noncompliance who [facility] from urgent care due to uncontrolled hypertension with SBP in the 230s.  Initially went to urgent care due to recurrent moderate headaches of 3 days duration, mainly located in the right side of the top of his head, no visual, motor, or sensory deficits.  Took over-the-counter Tylenol with mild improvement.  Associated with a nonproductive cough of 3 days duration.  His headache is worsened when he coughs.  While at urgent care his blood pressure was significantly elevated, had an abnormal twelve-lead EKG and was recommended to come to the ED for further evaluation.  Denies chest pain, dyspnea, or palpitation.  Ran out of his blood pressure medications 6 months ago.  Has been vaccinated for COVID-19 with [employer] vaccine in May 2021.  New events last 24 hours / Subjective: He states that he has some chest pain that is associated with cough.  Headache is mildly improved.  He wants to be discharged home.  We discussed that patient is not stable for discharge due to elevated blood pressure, still requiring medication adjustments.  Echocardiogram is pending as well.  He has acute kidney injury, limiting some options for antihypertensives.  He states that he has work at 8 AM and needs to go home.  I discussed with him the risks of signing out AGAINST MEDICAL ADVICE including heart attack, stroke and even death.  Assessment & Plan:   Active Problems:   Hypertensive emergency   Hypertensive emergency  -Improving but not stable yet -Echocardiogram pending -Continue Norvasc, Coreg, hydralazine.  Now that AKI is resolved, I will resume HCTZ  AKI -Resolved  Demand ischemia -Troponin peaked at  59 -Echocardiogram pending   DVT prophylaxis:  enoxaparin (LOVENOX) injection 40 mg Start: 12/11/19 2115  Code Status: Full code Family Communication: No family at bedside Disposition Plan:   Status is: Observation  The patient will require care spanning > 2 midnights and should be moved to inpatient because: Ongoing diagnostic testing needed not appropriate for outpatient work up and Inpatient level of care appropriate due to severity of illness  Dispo:  Patient From: Home  Planned Disposition: Home  Expected discharge date: 12/13/19  Medically stable for discharge: No.  Patient's blood pressure remains elevated 170/110.  Continue to make medication adjustments.  He is still also having headaches associated with his high blood pressure.  His initial echocardiogram could not assess inferior lateral and lateral walls, it was suspicious for possible wall motion abnormality.  They recommended Definity contrast study which has been ordered and pending.   Consultants:   None  Procedures:   None  Antimicrobials:  Anti-infectives (From admission, onward)   None        Objective: Vitals:   12/12/19 0215 12/12/19 0254 12/12/19 0420 12/12/19 0424  BP: (!) 159/114 (!) 164/113 (!) 170/110 (!) 170/110  Pulse: 92 81  93  Resp: (!) 26 (!) 22  17  Temp:      TempSrc:      SpO2:  98%  99%  Weight:      Height:       No intake or output data in the 24 hours ending 12/12/19 1252 Filed Weights   12/11/19 1944  Weight: 84.4 kg    Examination:  General exam: Appears calm and comfortable  Respiratory system: Clear to auscultation. Respiratory effort normal. No respiratory distress. No conversational dyspnea.  Cardiovascular system: S1 & S2 heard, RRR. No murmurs. No pedal edema. Gastrointestinal system: Abdomen is nondistended, soft and nontender. Normal bowel sounds heard. Central nervous system: Alert and oriented. No focal neurological deficits. Speech clear.  Extremities:  Symmetric in appearance  Skin: No rashes, lesions or ulcers on exposed skin  Psychiatry: Judgement and insight appear normal. Mood & affect appropriate.   Data Reviewed: I have personally reviewed following labs and imaging studies  CBC: Recent Labs  Lab 12/11/19 1621 12/12/19 0939  WBC 7.7 8.0  HGB 13.5 13.9  HCT 42.1 44.3  MCV 91.7 93.1  PLT 327 325   Basic Metabolic Panel: Recent Labs  Lab 12/11/19 1621 12/11/19 2230 12/12/19 0939  NA 140  --  139  K 2.9*  --  3.8  CL 99  --  106  CO2 28  --  22  GLUCOSE 163*  --  125*  BUN 15  --  10  CREATININE 1.42*  --  1.21  CALCIUM 9.9  --  9.5  MG  --  1.9  --    GFR: Estimated Creatinine Clearance: 82.8 mL/min (by C-G formula based on SCr of 1.21 mg/dL). Liver Function Tests: No results for input(s): AST, ALT, ALKPHOS, BILITOT, PROT, ALBUMIN in the last 168 hours. No results for input(s): LIPASE, AMYLASE in the last 168 hours. No results for input(s): AMMONIA in the last 168 hours. Coagulation Profile: No results for input(s): INR, PROTIME in the last 168 hours. Cardiac Enzymes: No results for input(s): CKTOTAL, CKMB, CKMBINDEX, TROPONINI in the last 168 hours. BNP (last 3 results) No results for input(s): PROBNP in the last 8760 hours. HbA1C: Recent Labs    12/12/19 0939  HGBA1C 5.1   CBG: Recent Labs  Lab 12/11/19 2334 12/12/19 0910  GLUCAP 120* 125*   Lipid Profile: No results for input(s): CHOL, HDL, LDLCALC, TRIG, CHOLHDL, LDLDIRECT in the last 72 hours. Thyroid Function Tests: No results for input(s): TSH, T4TOTAL, FREET4, T3FREE, THYROIDAB in the last 72 hours. Anemia Panel: No results for input(s): VITAMINB12, FOLATE, FERRITIN, TIBC, IRON, RETICCTPCT in the last 72 hours. Sepsis Labs: No results for input(s): PROCALCITON, LATICACIDVEN in the last 168 hours.  Recent Results (from the past 240 hour(s))  SARS Coronavirus 2 by RT PCR (hospital order, performed in [facility] hospital lab)  Nasopharyngeal Nasopharyngeal Swab     Status: None   Collection Time: 12/11/19  7:43 PM   Specimen: Nasopharyngeal Swab  Result Value Ref Range Status   SARS Coronavirus 2 NEGATIVE NEGATIVE Final    Comment: (NOTE) SARS-CoV-2 target nucleic acids are NOT DETECTED.  The SARS-CoV-2 RNA is generally detectable in upper and lower respiratory specimens during the acute phase of infection. The lowest concentration of SARS-CoV-2 viral copies this assay can detect is 250 copies / mL. A negative result does not preclude SARS-CoV-2 infection and should not be used as the sole basis for treatment or other patient management decisions.  A negative result may occur with improper specimen collection / handling, submission of specimen other than nasopharyngeal swab, presence of viral mutation(s) within the areas targeted by this assay, and inadequate number of viral copies (<250 copies / mL). A negative result must be combined with clinical observations, patient history, and epidemiological information.  Fact Sheet for Patients:   BoilerBrush.com.cy  Fact Sheet for Healthcare Providers: https://pope.com/  This  test is not yet approved or  cleared by the [place] and has been authorized for detection and/or diagnosis of SARS-CoV-2 by FDA under an Emergency Use Authorization (EUA).  This EUA will remain in effect (meaning this test can be used) for the duration of the COVID-19 declaration under Section 564(b)(1) of the Act, 21 U.S.C. section 360bbb-3(b)(1), unless the authorization is terminated or revoked sooner.  Performed at [facility], [address] [place], [place] [address]       Radiology Studies: DG Chest 2 View  Result Date: 12/11/2019 CLINICAL DATA:  Elevated blood pressure in EKG changes EXAM: CHEST - 2 VIEW COMPARISON:  June 06, 2018 FINDINGS: Trachea midline. Cardiomediastinal contours and hilar structures are  accentuated by slightly low lung volumes and portable technique. Retrocardiac opacity behind the RIGHT heart border compatible with lower lobe process partially obscuring the medial aspect of the RIGHT hemidiaphragm and also seen on the lateral view. On limited assessment skeletal structures without acute process. IMPRESSION: Suspect RIGHT lower lobe airspace disease/pneumonia. Small lesion in the lung behind the RIGHT heart is also considered. Follow-up is suggested to ensure resolution. Electronically Signed   By: Donzetta Kohut M.D.   On: 12/11/2019 18:05   CT Head Wo Contrast  Result Date: 12/11/2019 CLINICAL DATA:  39 year old presenting with acute hypertension and 2 day history of headache. EXAM: CT HEAD WITHOUT CONTRAST TECHNIQUE: Contiguous axial images were obtained from the base of the skull through the vertex without intravenous contrast. COMPARISON:  CT head and MRI brain 06/06/2018. FINDINGS: Brain: Ventricular system normal in size and appearance for age. Mild changes of small vessel disease of the white matter diffusely, unchanged from the prior CT and MRI. No mass lesion. No midline shift. No acute hemorrhage or hematoma. No extra-axial fluid collections. No evidence of acute infarction. Vascular: No hyperdense vessel.  No visible atherosclerosis. Skull: No skull fracture or other focal osseous abnormality involving the skull. Sinuses/Orbits: Visualized paranasal sinuses, bilateral mastoid air cells and bilateral middle ear cavities well-aerated. Visualized orbits and globes normal in appearance. Other: None. IMPRESSION: 1. No acute intracranial abnormality. 2. Stable mild chronic microvascular ischemic changes of the white matter. Electronically Signed   By: Hulan Saas M.D.   On: 12/11/2019 18:08   ECHOCARDIOGRAM COMPLETE  Result Date: 12/12/2019    ECHOCARDIOGRAM REPORT   Patient Name:   DORSE LOCY Date of Exam: 12/12/2019 Medical Rec #:  [id number]             Height:        65.0 in Accession #:    [id number]            Weight:       186.0 lb Date of Birth:  1980/10/19             BSA:          1.918 m Patient Age:    39 years              BP:           170/110 mmHg Patient Gender: M                     HR:           83 bpm. Exam Location:  Inpatient Procedure: 2D Echo Indications:    elevated troponin  History:        Patient has prior history of Echocardiogram examinations, most  recent 12/14/2018. Risk Factors:Hypertension.  Sonographer:    Delcie Roch Referring Phys: [id number] CAROLE N HALL IMPRESSIONS  1. Left ventricular ejection fraction, by estimation, is 55 to 60%. The left ventricle has normal function. Left ventricular endocardial border not optimally defined to evaluate regional wall motion. Left ventricular diastolic parameters are indeterminate. Elevated left ventricular end-diastolic pressure.  2. Right ventricular systolic function is normal. The right ventricular size is normal. Tricuspid regurgitation signal is inadequate for assessing PA pressure.  3. The mitral valve is normal in structure. Trivial mitral valve regurgitation. No evidence of mitral stenosis.  4. The aortic valve is tricuspid. Aortic valve regurgitation is not visualized. Mild aortic valve sclerosis is present, with no evidence of aortic valve stenosis.  5. The inferior vena cava is normal in size with greater than 50% respiratory variability, suggesting right atrial pressure of 3 mmHg.  6. Recommend definity contrast study to assess the inferolateral and lateral walls. The endocardial borders are not well defined and suspicious for possible wall motion abnormality. FINDINGS  Left Ventricle: Left ventricular ejection fraction, by estimation, is 55 to 60%. The left ventricle has normal function. Left ventricular endocardial border not optimally defined to evaluate regional wall motion. The left ventricular internal cavity size was normal in size. There is no left ventricular  hypertrophy. Left ventricular diastolic parameters are indeterminate. Elevated left ventricular end-diastolic pressure. Right Ventricle: The right ventricular size is normal. No increase in right ventricular wall thickness. Right ventricular systolic function is normal. Tricuspid regurgitation signal is inadequate for assessing PA pressure. Left Atrium: Left atrial size was normal in size. Right Atrium: Right atrial size was normal in size. Pericardium: There is no evidence of pericardial effusion. Mitral Valve: The mitral valve is normal in structure. Trivial mitral valve regurgitation. No evidence of mitral valve stenosis. Tricuspid Valve: The tricuspid valve is normal in structure. Tricuspid valve regurgitation is not demonstrated. No evidence of tricuspid stenosis. Aortic Valve: The aortic valve is tricuspid. Aortic valve regurgitation is not visualized. Mild aortic valve sclerosis is present, with no evidence of aortic valve stenosis. Pulmonic Valve: The pulmonic valve was normal in structure. Pulmonic valve regurgitation is not visualized. No evidence of pulmonic stenosis. Aorta: The aortic root is normal in size and structure. Venous: The inferior vena cava is normal in size with greater than 50% respiratory variability, suggesting right atrial pressure of 3 mmHg. IAS/Shunts: No atrial level shunt detected by color flow Doppler.  LEFT VENTRICLE PLAX 2D LVIDd:         4.50 cm  Diastology LVIDs:         3.10 cm  LV e' medial:    6.09 cm/s LV PW:         1.20 cm  LV E/e' medial:  18.6 LV IVS:        1.10 cm  LV e' lateral:   6.96 cm/s LVOT diam:     1.80 cm  LV E/e' lateral: 16.2 LV SV:         51 LV SV Index:   27 LVOT Area:     2.54 cm  RIGHT VENTRICLE             IVC RV S prime:     14.50 cm/s  IVC diam: 1.20 cm TAPSE (M-mode): 2.2 cm LEFT ATRIUM             Index       RIGHT ATRIUM          Index LA  diam:        3.90 cm 2.03 cm/m  RA Area:     8.18 cm LA Vol (A2C):   45.2 ml 23.57 ml/m RA Volume:   14.10  ml 7.35 ml/m LA Vol (A4C):   52.8 ml 27.53 ml/m LA Biplane Vol: 51.0 ml 26.59 ml/m  AORTIC VALVE LVOT Vmax:   120.00 cm/s LVOT Vmean:  79.500 cm/s LVOT VTI:    0.200 m  AORTA Ao Root diam: 3.60 cm Ao Asc diam:  3.40 cm MITRAL VALVE MV Area (PHT): 4.06 cm     SHUNTS MV Decel Time: 187 msec     Systemic VTI:  0.20 m MV E velocity: 113.00 cm/s  Systemic Diam: 1.80 cm MV A velocity: 92.40 cm/s MV E/A ratio:  1.22 Armanda Magic MD Electronically signed by Armanda Magic MD Signature Date/Time: 12/12/2019/9:14:23 AM    Final       Scheduled Meds: . amLODipine  10 mg Oral Daily  . carvedilol  6.25 mg Oral BID WC  . dextromethorphan-guaiFENesin  2 tablet Oral BID  . enoxaparin (LOVENOX) injection  40 mg Subcutaneous Q24H  . hydrALAZINE  10 mg Oral Q8H  . insulin aspart  0-5 Units Subcutaneous QHS  . insulin aspart  0-9 Units Subcutaneous TID WC   Continuous Infusions:   LOS: 0 days      Time spent: 35 minutes   Noralee Stain, DO [facility] 12/12/2019, 12:52 PM   Available via Epic secure chat 7am-7pm After these hours, please refer to coverage provider listed on [email or web address]

## 2019-12-12 NOTE — Progress Notes (Signed)
  Echocardiogram 2D Echocardiogram has been performed.  Delcie Roch 12/12/2019, 8:23 AM

## 2019-12-12 NOTE — ED Notes (Signed)
Patient is resting comfortably. 

## 2019-12-12 NOTE — ED Notes (Signed)
Report given.

## 2019-12-13 ENCOUNTER — Observation Stay (HOSPITAL_COMMUNITY): Payer: Managed Care, Other (non HMO)

## 2019-12-13 DIAGNOSIS — Z23 Encounter for immunization: Secondary | ICD-10-CM | POA: Diagnosis not present

## 2019-12-13 DIAGNOSIS — R931 Abnormal findings on diagnostic imaging of heart and coronary circulation: Secondary | ICD-10-CM | POA: Diagnosis not present

## 2019-12-13 DIAGNOSIS — Z886 Allergy status to analgesic agent status: Secondary | ICD-10-CM | POA: Diagnosis not present

## 2019-12-13 DIAGNOSIS — I169 Hypertensive crisis, unspecified: Secondary | ICD-10-CM | POA: Diagnosis present

## 2019-12-13 DIAGNOSIS — R739 Hyperglycemia, unspecified: Secondary | ICD-10-CM | POA: Diagnosis present

## 2019-12-13 DIAGNOSIS — Z20822 Contact with and (suspected) exposure to covid-19: Secondary | ICD-10-CM | POA: Diagnosis present

## 2019-12-13 DIAGNOSIS — Z79899 Other long term (current) drug therapy: Secondary | ICD-10-CM | POA: Diagnosis not present

## 2019-12-13 DIAGNOSIS — N179 Acute kidney failure, unspecified: Secondary | ICD-10-CM

## 2019-12-13 DIAGNOSIS — E876 Hypokalemia: Secondary | ICD-10-CM | POA: Diagnosis present

## 2019-12-13 DIAGNOSIS — I161 Hypertensive emergency: Principal | ICD-10-CM

## 2019-12-13 DIAGNOSIS — I214 Non-ST elevation (NSTEMI) myocardial infarction: Secondary | ICD-10-CM | POA: Diagnosis present

## 2019-12-13 DIAGNOSIS — Z9114 Patient's other noncompliance with medication regimen: Secondary | ICD-10-CM | POA: Diagnosis not present

## 2019-12-13 DIAGNOSIS — R7989 Other specified abnormal findings of blood chemistry: Secondary | ICD-10-CM

## 2019-12-13 DIAGNOSIS — I10 Essential (primary) hypertension: Secondary | ICD-10-CM | POA: Diagnosis present

## 2019-12-13 DIAGNOSIS — Z888 Allergy status to other drugs, medicaments and biological substances status: Secondary | ICD-10-CM | POA: Diagnosis not present

## 2019-12-13 DIAGNOSIS — I248 Other forms of acute ischemic heart disease: Secondary | ICD-10-CM | POA: Diagnosis present

## 2019-12-13 LAB — BASIC METABOLIC PANEL
Anion gap: 12 (ref 5–15)
BUN: 20 mg/dL (ref 6–20)
CO2: 23 mmol/L (ref 22–32)
Calcium: 9.9 mg/dL (ref 8.9–10.3)
Chloride: 104 mmol/L (ref 98–111)
Creatinine, Ser: 1.66 mg/dL — ABNORMAL HIGH (ref 0.61–1.24)
GFR calc Af Amer: 60 mL/min — ABNORMAL LOW (ref >60)
GFR calc non Af Amer: 52 mL/min — ABNORMAL LOW (ref >60)
Glucose, Bld: 116 mg/dL — ABNORMAL HIGH (ref 70–99)
Potassium: 3.3 mmol/L — ABNORMAL LOW (ref 3.5–5.1)
Sodium: 139 mmol/L (ref 135–145)

## 2019-12-13 LAB — ECHOCARDIOGRAM LIMITED
Height: 65 in
Weight: 2976 oz

## 2019-12-13 MED ORDER — PERFLUTREN LIPID MICROSPHERE
1.0000 mL | INTRAVENOUS | Status: AC | PRN
  Administered 2019-12-13: 2 mL via INTRAVENOUS
  Filled 2019-12-13: qty 10

## 2019-12-13 MED ORDER — CARVEDILOL 25 MG PO TABS
25.0000 mg | ORAL_TABLET | Freq: Two times a day (BID) | ORAL | Status: DC
  Administered 2019-12-13 – 2019-12-15 (×4): 25 mg via ORAL
  Filled 2019-12-13 (×4): qty 1

## 2019-12-13 MED ORDER — HYDRALAZINE HCL 25 MG PO TABS
25.0000 mg | ORAL_TABLET | Freq: Three times a day (TID) | ORAL | Status: DC
  Administered 2019-12-13 – 2019-12-15 (×6): 25 mg via ORAL
  Filled 2019-12-13 (×6): qty 1

## 2019-12-13 MED ORDER — CARVEDILOL 12.5 MG PO TABS
12.5000 mg | ORAL_TABLET | Freq: Two times a day (BID) | ORAL | Status: DC
  Administered 2019-12-13: 12.5 mg via ORAL
  Filled 2019-12-13: qty 1

## 2019-12-13 MED ORDER — SODIUM CHLORIDE 0.9 % IV SOLN: INTRAVENOUS | Status: AC

## 2019-12-13 MED ORDER — ATORVASTATIN CALCIUM 40 MG PO TABS
40.0000 mg | ORAL_TABLET | Freq: Every day | ORAL | Status: DC
  Administered 2019-12-13 – 2019-12-15 (×3): 40 mg via ORAL
  Filled 2019-12-13 (×3): qty 1

## 2019-12-13 MED ORDER — POTASSIUM CHLORIDE CRYS ER 20 MEQ PO TBCR
40.0000 meq | EXTENDED_RELEASE_TABLET | Freq: Once | ORAL | Status: AC
  Administered 2019-12-13: 40 meq via ORAL
  Filled 2019-12-13: qty 2

## 2019-12-13 NOTE — Progress Notes (Signed)
  Echocardiogram 2D Echocardiogram has been performed.  Burnard Hawthorne 12/13/2019, 8:59 AM

## 2019-12-13 NOTE — Consult Note (Addendum)
Cardiology Consultation:   Patient ID: John Savage; [id number]; 25-Nov-1980   Admit date: 12/11/2019 Date of Consult: 12/13/2019  Primary Care Provider: Bing Neighbors, FNP Primary Cardiologist: Dr. Nanetta Batty, MD  Patient Profile:   John Savage is a 39 y.o. male with a hx of hypertension and abnormal EKG who is being seen today for the evaluation of abnormal echocardiogram and HTN at the request of Dr. Alvino Chapel.  History of Present Illness:   John Savage is a 39 year old male with a history stated above who presented to [facility] on 12/11/2019 from an urgent care center with uncontrolled HTN with BP noted to be 230/139. He initially presented for the evaluation of recent headaches for three days prior to presentation. He reports that he took OTC Tylenol with minimal improvement in symptoms. While at the UC an EKG was performed which was found to be abnormal therefore he was sent to [facility] for further evaluation.   EKG showed NSR with HR 81bpm and TWI in leads I, II, aVF, V2-V6 however similar to prior tracings but inversions are more pronounced. Echocardiogram performed 12/12/19 showed stable EF at 55-60% with ill defined borders, indeterminate diastolic function with recommendations to pursue echo with Definity. Repeat limited study performed today showed EF at 50-55% with moderate hypokinesis of the entire inferior wall and lateral wall>>findings suggestive of LCx territory ischemia/infarct. hsT elevated at 54>59>35. Head CT performed in the setting of headaches showed no acute intracranial abnormality and stable mild chronic microvascular ischemic changes of the white matter. CXR with right lower lobe airspace disease/pneumonia. K+ found to be 2.9.   Today he denies a recent hx of chest pain, palpitations, SOB, LE edema, orthopnea, PND, dizziness or syncope. No recent illness with fever, chills, or cough. He reports that since he has not been taking his antihypertensives for  the last several months. He states that he was scheduled for a follow up to obtain refills however he missed this appointment because of work.   He was initially referred to Dr. Allyson Sabal from his PCP for the evaluation of hypertension and abnormal. He underwent an echocardiogram 12/14/2018 which showed an EF at 55-60% with mild asymmetric LVH, no regional wall motion abnormalities, and no valvular disease. Renal US performed 06/07/2018 due to HTN which showed 1-59% in both R and L renal arteries   Past Medical History:  Diagnosis Date  . Hypertension     History reviewed. No pertinent surgical history.   Prior to Admission medications   Medication Sig Start Date End Date Taking? Authorizing Provider  ibuprofen (ADVIL) 200 MG tablet Take 400 mg by mouth every 6 (six) hours as needed (pain/headache).   Yes [provider]  amLODipine (NORVASC) 10 MG tablet Take 1 tablet (10 mg total) by mouth daily. Patient not taking: Reported on 12/11/2019 06/25/18   Bing Neighbors, FNP  carvedilol (COREG) 3.125 MG tablet Take 1 tablet (3.125 mg total) by mouth 2 (two) times daily with a meal. Patient not taking: Reported on 12/11/2019 11/24/18   Grayce Sessions, NP  hydrochlorothiazide (HYDRODIURIL) 25 MG tablet Take 1 tablet (25 mg total) by mouth daily. Patient not taking: Reported on 12/11/2019 06/23/18   Bing Neighbors, FNP  lisinopril (PRINIVIL,ZESTRIL) 40 MG tablet Take 1 tablet (40 mg total) by mouth daily. Patient not taking: Reported on 12/11/2019 06/25/18 09/29/18  Bing Neighbors, FNP    Inpatient Medications: Scheduled Meds: . amLODipine  10 mg Oral Daily  . carvedilol  12.5  mg Oral BID WC  . dextromethorphan-guaiFENesin  2 tablet Oral BID  . enoxaparin (LOVENOX) injection  40 mg Subcutaneous Q24H  . hydrALAZINE  25 mg Oral Q8H   Continuous Infusions: . sodium chloride 75 mL/hr at 12/13/19 0950   PRN Meds: acetaminophen, hydrALAZINE, ondansetron (ZOFRAN) IV  Allergies:      Allergies  Allergen Reactions  . Aspirin Other (See Comments)    Unknown reaction  . Novocain [Procaine] Other (See Comments)    Unknown reaction    Social History:   Social History   Socioeconomic History  . Marital status: Married    Spouse name: Not on file  . Number of children: Not on file  . Years of education: Not on file  . Highest education level: Not on file  Occupational History  . Not on file  Tobacco Use  . Smoking status: Never Smoker  . Smokeless tobacco: Never Used  Vaping Use  . Vaping Use: Never used  Substance and Sexual Activity  . Alcohol use: No  . Drug use: No  . Sexual activity: Not on file  Other Topics Concern  . Not on file  Social History Narrative   Right handed   Lives at home with wife and kids   Social Determinants of Health   Financial Resource Strain:   . Difficulty of Paying Living Expenses: Not on file  Food Insecurity:   . Worried About Programme researcher, broadcasting/film/video in the Last Year: Not on file  . Ran Out of Food in the Last Year: Not on file  Transportation Needs:   . Lack of Transportation (Medical): Not on file  . Lack of Transportation (Non-Medical): Not on file  Physical Activity:   . Days of Exercise per Week: Not on file  . Minutes of Exercise per Session: Not on file  Stress:   . Feeling of Stress : Not on file  Social Connections:   . Frequency of Communication with Friends and Family: Not on file  . Frequency of Social Gatherings with Friends and Family: Not on file  . Attends Religious Services: Not on file  . Active Member of Clubs or Organizations: Not on file  . Attends Banker Meetings: Not on file  . Marital Status: Not on file  Intimate Partner Violence:   . Fear of Current or Ex-Partner: Not on file  . Emotionally Abused: Not on file  . Physically Abused: Not on file  . Sexually Abused: Not on file    Family History:   Family History  Problem Relation Age of Onset  . Healthy Mother   .  Healthy Father    Family Status:  Family Status  Relation Name Status  . Mother  Alive  . Father  Alive    ROS:  Please see the history of present illness.  All other ROS reviewed and negative.     Physical Exam/Data:   Vitals:   12/12/19 1904 12/13/19 0009 12/13/19 0644 12/13/19 1235  BP: (!) 162/89 (!) 160/106 (!) 170/110 (!) 160/111  Pulse: 97 95 88 81  Resp: 17 16 16 16   Temp: 98.1 F (36.7 C) 98.4 F (36.9 C) (!) 97.5 F (36.4 C) 98 F (36.7 C)  TempSrc: Oral Oral Oral Oral  SpO2: 98% 92%  97%  Weight:      Height:        Intake/Output Summary (Last 24 hours) at 12/13/2019 1355 Last data filed at 12/12/2019 1900 Gross per 24 hour  Intake 480 ml  Output --  Net 480 ml   Filed Weights   12/11/19 1944  Weight: 84.4 kg   Body mass index is 30.95 kg/m.   General: Well developed, well nourished, NAD Neck: Negative for carotid bruits. No JVD Lungs:Clear to ausculation bilaterally. No wheezes, rales, or rhonchi. Breathing is unlabored. Cardiovascular: RRR with S1 S2. No murmurs Abdomen: Soft, non-tender, non-distended. No obvious abdominal masses. Extremities: No edema. Radial pulses 2+ bilaterally Neuro: Alert and oriented. No focal deficits. No facial asymmetry. MAE spontaneously. Psych: Responds to questions appropriately with normal affect.    EKG:  The EKG was personally reviewed and demonstrates: 12/13/19 NSR with TWI in leads I, II, aVF, V2-V6 however similar to prior tracings but inversions are more pronounced. Telemetry:  Telemetry was personally reviewed and demonstrates: 12/13/19 NSR HR 80's  Relevant CV Studies:  Echocardiogram with Definity 12/13/19:  1. Limited study for wall motion  2. Left ventricular ejection fraction, by estimation, is 50 to 55%. The  left ventricle has low normal function. There is moderate hypokinesis of  the left ventricular, entire inferior wall and lateral wall. Findings  suggestive of LCx territory  ischemia/infarct.    Comparison(s): Changes from prior study are noted. 12/12/2019: LVEF 55-60%,  possible inferior/inferolateral WMA.   Echocardiogram 12/12/2019:  1. Left ventricular ejection fraction, by estimation, is 55 to 60%. The  left ventricle has normal function. Left ventricular endocardial border  not optimally defined to evaluate regional wall motion. Left ventricular  diastolic parameters are  indeterminate. Elevated left ventricular end-diastolic pressure.  2. Right ventricular systolic function is normal. The right ventricular  size is normal. Tricuspid regurgitation signal is inadequate for assessing  PA pressure.  3. The mitral valve is normal in structure. Trivial mitral valve  regurgitation. No evidence of mitral stenosis.  4. The aortic valve is tricuspid. Aortic valve regurgitation is not  visualized. Mild aortic valve sclerosis is present, with no evidence of  aortic valve stenosis.  5. The inferior vena cava is normal in size with greater than 50%  respiratory variability, suggesting right atrial pressure of 3 mmHg.  6. Recommend definity contrast study to assess the inferolateral and  lateral walls. The endocardial borders are not well defined and suspicious  for possible wall motion abnormality  Echocardiogram 12/14/2018:  1. The left ventricle has normal systolic function, with an ejection  fraction of 55-60%. The cavity size was normal. There is mild asymmetric  left ventricular hypertrophy of the septal wall. Left ventricular  diastolic parameters were normal. No evidence  of left ventricular regional wall motion abnormalities.  2. The right ventricle has normal systolic function. The cavity was  normal. There is no increase in right ventricular wall thickness. Right  ventricular systolic pressure is normal with an estimated pressure of 16.9  mmHg.  3. No evidence of mitral valve stenosis.  4. The aortic valve is tricuspid. No stenosis of the aortic valve.   5. The aorta is normal unless otherwise noted.  6. The aortic root, ascending aorta and aortic arch are normal in size  and structure.   Laboratory Data:  Chemistry Recent Labs  Lab 12/11/19 1621 12/12/19 0939 12/13/19 0302  NA 140 139 139  K 2.9* 3.8 3.3*  CL 99 106 104  CO2 28 22 23   GLUCOSE 163* 125* 116*  BUN 15 10 20   CREATININE 1.42* 1.21 1.66*  CALCIUM 9.9 9.5 9.9  GFRNONAA >60 >60 52*  GFRAA >60 >60 60*  ANIONGAP  13 11 12     Total Protein  Date Value Ref Range Status  06/23/2018 7.4 6.0 - 8.5 g/dL Final   Albumin  Date Value Ref Range Status  06/23/2018 4.5 4.0 - 5.0 g/dL Final   AST  Date Value Ref Range Status  06/23/2018 33 0 - 40 IU/L Final   ALT  Date Value Ref Range Status  06/23/2018 61 (H) 0 - 44 IU/L Final   Alkaline Phosphatase  Date Value Ref Range Status  06/23/2018 98 39 - 117 IU/L Final   Bilirubin Total  Date Value Ref Range Status  06/23/2018 0.2 0.0 - 1.2 mg/dL Final   Hematology Recent Labs  Lab 12/11/19 1621 12/12/19 0939  WBC 7.7 8.0  RBC 4.59 4.76  HGB 13.5 13.9  HCT 42.1 44.3  MCV 91.7 93.1  MCH 29.4 29.2  MCHC 32.1 31.4  RDW 13.2 13.2  PLT 327 325   Cardiac EnzymesNo results for input(s): TROPONINI in the last 168 hours. No results for input(s): TROPIPOC in the last 168 hours.  BNPNo results for input(s): BNP, PROBNP in the last 168 hours.  DDimer No results for input(s): DDIMER in the last 168 hours. TSH:  Lab Results  Component Value Date   TSH 0.775 06/23/2018   Lipids:No results found for: CHOL, HDL, LDLCALC, LDLDIRECT, TRIG, CHOLHDL HgbA1c: Lab Results  Component Value Date   HGBA1C 5.1 12/12/2019    Radiology/Studies:  DG Chest 2 View  Result Date: 12/11/2019 CLINICAL DATA:  Elevated blood pressure in EKG changes EXAM: CHEST - 2 VIEW COMPARISON:  June 06, 2018 FINDINGS: Trachea midline. Cardiomediastinal contours and hilar structures are accentuated by slightly low lung volumes and portable  technique. Retrocardiac opacity behind the RIGHT heart border compatible with lower lobe process partially obscuring the medial aspect of the RIGHT hemidiaphragm and also seen on the lateral view. On limited assessment skeletal structures without acute process. IMPRESSION: Suspect RIGHT lower lobe airspace disease/pneumonia. Small lesion in the lung behind the RIGHT heart is also considered. Follow-up is suggested to ensure resolution. Electronically Signed   By: June 08, 2018 M.D.   On: 12/11/2019 18:05   CT Head Wo Contrast  Result Date: 12/11/2019 CLINICAL DATA:  39 year old presenting with acute hypertension and 2 day history of headache. EXAM: CT HEAD WITHOUT CONTRAST TECHNIQUE: Contiguous axial images were obtained from the base of the skull through the vertex without intravenous contrast. COMPARISON:  CT head and MRI brain 06/06/2018. FINDINGS: Brain: Ventricular system normal in size and appearance for age. Mild changes of small vessel disease of the white matter diffusely, unchanged from the prior CT and MRI. No mass lesion. No midline shift. No acute hemorrhage or hematoma. No extra-axial fluid collections. No evidence of acute infarction. Vascular: No hyperdense vessel.  No visible atherosclerosis. Skull: No skull fracture or other focal osseous abnormality involving the skull. Sinuses/Orbits: Visualized paranasal sinuses, bilateral mastoid air cells and bilateral middle ear cavities well-aerated. Visualized orbits and globes normal in appearance. Other: None. IMPRESSION: 1. No acute intracranial abnormality. 2. Stable mild chronic microvascular ischemic changes of the white matter. Electronically Signed   By: 08/06/2018 M.D.   On: 12/11/2019 18:08   ECHOCARDIOGRAM COMPLETE  Result Date: 12/12/2019    ECHOCARDIOGRAM REPORT   Patient Name:   JATINDER MCDONAGH Date of Exam: 12/12/2019 Medical Rec #:  [id number]             Height:       65.0 in Accession #:  [id number]            Weight:        186.0 lb Date of Birth:  29-May-1980             BSA:          1.918 m Patient Age:    39 years              BP:           170/110 mmHg Patient Gender: M                     HR:           83 bpm. Exam Location:  Inpatient Procedure: 2D Echo Indications:    elevated troponin  History:        Patient has prior history of Echocardiogram examinations, most                 recent 12/14/2018. Risk Factors:Hypertension.  Sonographer:    Delcie Roch Referring Phys: [id number] CAROLE N HALL IMPRESSIONS  1. Left ventricular ejection fraction, by estimation, is 55 to 60%. The left ventricle has normal function. Left ventricular endocardial border not optimally defined to evaluate regional wall motion. Left ventricular diastolic parameters are indeterminate. Elevated left ventricular end-diastolic pressure.  2. Right ventricular systolic function is normal. The right ventricular size is normal. Tricuspid regurgitation signal is inadequate for assessing PA pressure.  3. The mitral valve is normal in structure. Trivial mitral valve regurgitation. No evidence of mitral stenosis.  4. The aortic valve is tricuspid. Aortic valve regurgitation is not visualized. Mild aortic valve sclerosis is present, with no evidence of aortic valve stenosis.  5. The inferior vena cava is normal in size with greater than 50% respiratory variability, suggesting right atrial pressure of 3 mmHg.  6. Recommend definity contrast study to assess the inferolateral and lateral walls. The endocardial borders are not well defined and suspicious for possible wall motion abnormality. FINDINGS  Left Ventricle: Left ventricular ejection fraction, by estimation, is 55 to 60%. The left ventricle has normal function. Left ventricular endocardial border not optimally defined to evaluate regional wall motion. The left ventricular internal cavity size was normal in size. There is no left ventricular hypertrophy. Left ventricular diastolic parameters are  indeterminate. Elevated left ventricular end-diastolic pressure. Right Ventricle: The right ventricular size is normal. No increase in right ventricular wall thickness. Right ventricular systolic function is normal. Tricuspid regurgitation signal is inadequate for assessing PA pressure. Left Atrium: Left atrial size was normal in size. Right Atrium: Right atrial size was normal in size. Pericardium: There is no evidence of pericardial effusion. Mitral Valve: The mitral valve is normal in structure. Trivial mitral valve regurgitation. No evidence of mitral valve stenosis. Tricuspid Valve: The tricuspid valve is normal in structure. Tricuspid valve regurgitation is not demonstrated. No evidence of tricuspid stenosis. Aortic Valve: The aortic valve is tricuspid. Aortic valve regurgitation is not visualized. Mild aortic valve sclerosis is present, with no evidence of aortic valve stenosis. Pulmonic Valve: The pulmonic valve was normal in structure. Pulmonic valve regurgitation is not visualized. No evidence of pulmonic stenosis. Aorta: The aortic root is normal in size and structure. Venous: The inferior vena cava is normal in size with greater than 50% respiratory variability, suggesting right atrial pressure of 3 mmHg. IAS/Shunts: No atrial level shunt detected by color flow Doppler.  LEFT VENTRICLE PLAX 2D LVIDd:         4.50  cm  Diastology LVIDs:         3.10 cm  LV e' medial:    6.09 cm/s LV PW:         1.20 cm  LV E/e' medial:  18.6 LV IVS:        1.10 cm  LV e' lateral:   6.96 cm/s LVOT diam:     1.80 cm  LV E/e' lateral: 16.2 LV SV:         51 LV SV Index:   27 LVOT Area:     2.54 cm  RIGHT VENTRICLE             IVC RV S prime:     14.50 cm/s  IVC diam: 1.20 cm TAPSE (M-mode): 2.2 cm LEFT ATRIUM             Index       RIGHT ATRIUM          Index LA diam:        3.90 cm 2.03 cm/m  RA Area:     8.18 cm LA Vol (A2C):   45.2 ml 23.57 ml/m RA Volume:   14.10 ml 7.35 ml/m LA Vol (A4C):   52.8 ml 27.53 ml/m LA  Biplane Vol: 51.0 ml 26.59 ml/m  AORTIC VALVE LVOT Vmax:   120.00 cm/s LVOT Vmean:  79.500 cm/s LVOT VTI:    0.200 m  AORTA Ao Root diam: 3.60 cm Ao Asc diam:  3.40 cm MITRAL VALVE MV Area (PHT): 4.06 cm     SHUNTS MV Decel Time: 187 msec     Systemic VTI:  0.20 m MV E velocity: 113.00 cm/s  Systemic Diam: 1.80 cm MV A velocity: 92.40 cm/s MV E/A ratio:  1.22 Armanda Magic MD Electronically signed by Armanda Magic MD Signature Date/Time: 12/12/2019/9:14:23 AM    Final    ECHOCARDIOGRAM LIMITED  Result Date: 12/13/2019    ECHOCARDIOGRAM LIMITED REPORT   Patient Name:   ZAKRY CASO Date of Exam: 12/13/2019 Medical Rec #:  [id number]             Height:       65.0 in Accession #:    [id number]            Weight:       186.0 lb Date of Birth:  1980/09/21             BSA:          1.918 m Patient Age:    39 years              BP:           170/110 mmHg Patient Gender: M                     HR:           88 bpm. Exam Location:  Inpatient Procedure: Limited Echo and Intracardiac Opacification Agent Indications:    Elevated Troponin  History:        Patient has prior history of Echocardiogram examinations, most                 recent 12/12/2019. Risk Factors:Hypertension and Non-Smoker.  Sonographer:    Renella Cunas RDCS Referring Phys: [id number] JENNIFER CHOI IMPRESSIONS  1. Limited study for wall motion  2. Left ventricular ejection fraction, by estimation, is 50 to 55%. The left ventricle has low normal function. There is moderate hypokinesis of the left ventricular, entire  inferior wall and lateral wall. Findings suggestive of LCx territory ischemia/infarct. Comparison(s): Changes from prior study are noted. 12/12/2019: LVEF 55-60%, possible inferior/inferolateral WMA. FINDINGS  Left Ventricle: Left ventricular ejection fraction, by estimation, is 50 to 55%. The left ventricle has low normal function. Moderate hypokinesis of the left ventricular, entire inferior wall and lateral wall. Definity contrast agent was  given IV to delineate the left ventricular endocardial borders.  LV Wall Scoring: The anterior wall, entire lateral wall, and basal inferior segment are hypokinetic. The entire septum, mid and distal inferior wall, apical anterior segment, and apex are normal. Findings suggestive of LCx territory ischemia/infarct. Zoila Shutter MD Electronically signed by Zoila Shutter MD Signature Date/Time: 12/13/2019/11:54:00 AM    Final    Assessment and Plan:   1. HTN emergency: -Pt presented to an outside UC center with c/o headaches found to have markedly elevated BP's at 230/139 which have improved however remain elevated with SBP in the 160-170 range.  -He reports not having his antihypertensives for the last several months as he ran out and was not able to go to his follow up appointment due to work -Currently on Norvasc, carvedilol and hydralazine>>would continue these however will increase carvedilol to 25mg  BID   2. Elevated hsT with abnormal echocardiogram: -Prior echo showed echocardiogram 12/14/2018 which showed an EF at 55-60% with mild asymmetric LVH, no regional wall motion abnormalities, and no valvular disease. -EKG showed NSR with HR 81bpm and TWI in leads I, II, aVF, V2-V6 however similar to prior tracings but inversions are more pronounced.  -Echocardiogram performed 12/12/19 showed stable EF at 55-60% with ill defined borders, indeterminate diastolic function with recommendations to pursue echo with Definity. Repeat limited study performed today showed EF at 50-55% with moderate hypokinesis of the entire inferior wall and lateral wall>>findings suggestive of LCx territory ischemia/infarct.  -hsT elevated at 54>59>35 -Head CT performed in the setting of headaches showed no acute intracranial abnormality and stable mild chronic microvascular ischemic changes of the white matter.  -In the absence of chest pain or other anginal symptoms, would focus on greater BP control for now given stable and flat  hsT with no acute change in EKG from prior tracing in 05/2018 and markedly elevated BP on presentation.   -Will obtain lipid panel and add atorvastatin for risk factor modification  3. AKI: -Creatinine, 1.42 which initially improved however elevated once again this AM to 1.66 -Baseline creatinine appears to be in the 1.0 range  -Currently receiving IVF hydration     For questions or updates, please contact [facility] Please consult [email or web address] for contact info under Cardiology/STEMI.   Raliegh Ip NP-[facility] Pager: [phone] 12/13/2019 1:55 PM  Personally seen and examined. Agree with above.   Feels well currently.  Headache has resolved.  No chest pain, no syncope no shortness of breath.  On exam he is alert and oriented x3, regular rate and rhythm with no murmurs rubs or gallops, lungs are clear to auscultation bilaterally, moves all extremities without difficulty.  Slightly protuberant abdomen.  High-sensitivity troponin was minimally elevated 59 down to 35.  EKG personally reviewed including priors, LVH with repolarization abnormalities.  CT scan of abdomen pelvis in 2011 personally reviewed showed no evidence of aortic atherosclerosis.  Creatinine 1.66.  Assessment and plan:  Hypertensive emergency Mildly elevated troponin Wall motion abnormality on echocardiogram lateral, overall normal ejection fraction, mild LVH AKI  -Overall treatment of blood pressure is about most importance.  Highly elevated upon admission.  Much improved currently.  I think this will be our #1 goal.  He is not having any previous chest discomfort.  Definity contrast echocardiogram personally reviewed does show what looks like a mid infero-lateral wall hypokinesis.  Prior echo demonstrates this as well.  I do not feel strongly given his lack of anginal symptoms that we need to pursue any further noninvasive or invasive testing.  I think we can help alter his course mostly by  treatment of blood pressure.  It does not seem unreasonable to check an LDL and to start him on statin therapy as well. -Obviously if anginal symptoms were to develop, further testing such as cardiac CT if his creatinine remains reasonable seems a good choice. -His mildly elevated troponin can be a result of demand ischemia in the setting of super hypertension.  Please have him continue to follow closely with primary physician/Dr. Allyson Sabal.  I like the idea of trying to obtain a blood pressure cuff for him and potentially performing virtual visits to help modify his blood pressure in that manner.  Donato Schultz, MD

## 2019-12-13 NOTE — TOC Initial Note (Signed)
Transition of Care [facility]) - Initial/Assessment Note    Patient Details  Name: John Savage MRN: [id number] Date of Birth: 11-22-1980  Transition of Care [facility]) CM/SW Contact:    Kingsley Plan, RN Phone Number: 12/13/2019, 11:53 AM  Clinical Narrative:                  Patient from home. Was on prescription blood pressure medication , however he has not been to PCP and auto refill stopped.   Patient states PCP is Carmelina Dane at [facility], called Joaquin Courts NP now works at urgent care. Patient has not been to practice since March 2020, therefore he would be a new patient. First available appointment for new patient is December 2021 ( he had a few no show appointments).    Called [facility], scheduled appointment for December 23, 2019 at 1110. Information on AVS, patient aware.  Expected Discharge Plan: Home/Self Care Barriers to Discharge: Continued Medical Work up   Patient Goals and CMS Choice Patient states their goals for this hospitalization and ongoing recovery are:: to return to home CMS Medicare.gov Compare Post Acute Care list provided to:: Patient Choice offered to / list presented to : Patient  Expected Discharge Plan and Services Expected Discharge Plan: Home/Self Care   Discharge Planning Services: [facility]igent [facility]   Living arrangements for the past 2 months: Apartment                   DME Agency: NA       HH Arranged: NA          Prior Living Arrangements/Services Living arrangements for the past 2 months: Apartment Lives with:: Self Patient language and need for interpreter reviewed:: Yes Do you feel safe going back to the place where you live?: Yes      Need for Family Participation in Patient Care: No (Comment) Care giver support system in place?: No (comment)   Criminal Activity/Legal Involvement Pertinent to Current Situation/Hospitalization: No - Comment as  needed  Activities of Daily Living Home Assistive Devices/Equipment: None ADL Screening (condition at time of admission) Patient's cognitive ability adequate to safely complete daily activities?: Yes Is the patient deaf or have difficulty hearing?: No Does the patient have difficulty seeing, even when wearing glasses/contacts?: No Does the patient have difficulty concentrating, remembering, or making decisions?: No Patient able to express need for assistance with ADLs?: Yes Does the patient have difficulty dressing or bathing?: No Independently performs ADLs?: Yes (appropriate for developmental age) Does the patient have difficulty walking or climbing stairs?: No Weakness of Legs: None Weakness of Arms/Hands: None  Permission Sought/Granted   Permission granted to share information with : No              Emotional Assessment Appearance:: Appears stated age Attitude/Demeanor/Rapport: Engaged Affect (typically observed): Accepting Orientation: : Oriented to Self, Oriented to Place, Oriented to  Time, Oriented to Situation Alcohol / Substance Use: Not Applicable Psych Involvement: No (comment)  Admission diagnosis:  Hypertensive crisis [I16.9] Hypokalemia [E87.6] Hyperglycemia [R73.9] Elevated troponin [R77.8] AKI (acute kidney injury) (HCC) [N17.9] Hypertensive emergency [I16.1] Patient Active Problem List   Diagnosis Date Noted  . Hypertensive emergency 12/11/2019  . Left ventricular hypertrophy by electrocardiogram 07/28/2018  . Acute upper respiratory infection 06/07/2018  . Headache 06/07/2018  . Abnormal brain MRI 06/07/2018  . Severe hypertension 06/06/2018   PCP:  Bing Neighbors, FNP Pharmacy:   [facility] [phone] -  [place], [place] - [address] AT [facility] [address] [place] [address] Phone: [phone] Fax: [phone]     Social Determinants of Health (SDOH) Interventions    Readmission Risk  Interventions No flowsheet data found.

## 2019-12-13 NOTE — Progress Notes (Addendum)
PROGRESS NOTE    John Savage  HGD:[id number] DOB: Aug 15, 1980 DOA: 12/11/2019 PCP: Bing Neighbors, FNP     Brief Narrative:  John Savage is a 39 y.o. male with medical history significant for essential hypertension, medication noncompliance who presented to [facility] ED from urgent care due to uncontrolled hypertension with SBP in the 230s.  Initially went to urgent care due to recurrent moderate headaches of 3 days duration, mainly located in the right side of the top of his head, no visual, motor, or sensory deficits.  Took over-the-counter Tylenol with mild improvement.  Associated with a nonproductive cough of 3 days duration.  His headache is worsened when he coughs.  While at urgent care his blood pressure was significantly elevated, had an abnormal twelve-lead EKG and was recommended to come to the ED for further evaluation.  Denies chest pain, dyspnea, or palpitation.  Ran out of his blood pressure medications 6 months ago.  Has been vaccinated for COVID-19 with [employer] vaccine in May 2021.  New events last 24 hours / Subjective: No complaints of chest pain, shortness of breath today.  Headache has improved.  Assessment & Plan:   Active Problems:   Hypertensive emergency   Hypertensive emergency  -Improving but not stable yet -Continue Norvasc, Coreg, hydralazine -Resumed HCTZ, now with worsening kidney function.  Stop HCTZ.  Also hold lisinopril.  I have adjusted doses of Coreg, hydralazine this morning  AKI -Baseline creatinine 1.0.  Admitted with creatinine 1.42, improved to 1.21, back up to 1.66 this morning.  Worsened overnight on HCTZ.  Start IV fluid for next 12 hours, trend BMP  Hypokalemia -Replace, trend  NSTEMI -Troponin peaked at 59 -Addendum: repeat echo showed moderate hypokinesis of the left ventricular, entire inferior wall and lateral wall. Findings  suggestive of LCx territory ischemia/infarct. Cardiology consulted   Medical  noncompliance -Has not taken his antihypertensives for the last 6 months.  He remains at high risk of readmission   DVT prophylaxis:  enoxaparin (LOVENOX) injection 40 mg Start: 12/11/19 2115  Code Status: Full code Family Communication: No family at bedside Disposition Plan:   Status is: Observation  The patient will require care spanning > 2 midnights and should be moved to inpatient because: Ongoing diagnostic testing needed not appropriate for outpatient work up and Inpatient level of care appropriate due to severity of illness  Dispo:  Patient From: Home  Planned Disposition: Home  Expected discharge date: 12/14/19  Medically stable for discharge: No.  Patient's blood pressure remains elevated 170/110 this morning.  Continue to make medication adjustments. Echo showed findings concerning for LCx ischemia. Cardiology consulted.    Consultants:   Cardiology   Procedures:   None  Antimicrobials:  Anti-infectives (From admission, onward)   None       Objective: Vitals:   12/12/19 1802 12/12/19 1904 12/13/19 0009 12/13/19 0644  BP: (!) 176/113 (!) 162/89 (!) 160/106 (!) 170/110  Pulse:  97 95 88  Resp:  17 16 16   Temp:  98.1 F (36.7 C) 98.4 F (36.9 C) (!) 97.5 F (36.4 C)  TempSrc:  Oral Oral Oral  SpO2:  98% 92%   Weight:      Height:        Intake/Output Summary (Last 24 hours) at 12/13/2019 1015 Last data filed at 12/12/2019 1900 Gross per 24 hour  Intake 480 ml  Output --  Net 480 ml   Filed Weights   12/11/19 1944  Weight: 84.4 kg  Examination: General exam: Appears calm and comfortable  Respiratory system: Clear to auscultation. Respiratory effort normal. Cardiovascular system: S1 & S2 heard, RRR. No pedal edema. Gastrointestinal system: Abdomen is nondistended, soft and nontender. Normal bowel sounds heard. Central nervous system: Alert and oriented. Non focal exam. Speech clear  Extremities: Symmetric in appearance bilaterally  Skin:  No rashes, lesions or ulcers on exposed skin  Psychiatry: Judgement and insight appear stable. Mood & affect appropriate.   Data Reviewed: I have personally reviewed following labs and imaging studies  CBC: Recent Labs  Lab 12/11/19 1621 12/12/19 0939  WBC 7.7 8.0  HGB 13.5 13.9  HCT 42.1 44.3  MCV 91.7 93.1  PLT 327 325   Basic Metabolic Panel: Recent Labs  Lab 12/11/19 1621 12/11/19 2230 12/12/19 0939 12/13/19 0302  NA 140  --  139 139  K 2.9*  --  3.8 3.3*  CL 99  --  106 104  CO2 28  --  22 23  GLUCOSE 163*  --  125* 116*  BUN 15  --  10 20  CREATININE 1.42*  --  1.21 1.66*  CALCIUM 9.9  --  9.5 9.9  MG  --  1.9  --   --    GFR: Estimated Creatinine Clearance: 60.3 mL/min (A) (by C-G formula based on SCr of 1.66 mg/dL (H)). Liver Function Tests: No results for input(s): AST, ALT, ALKPHOS, BILITOT, PROT, ALBUMIN in the last 168 hours. No results for input(s): LIPASE, AMYLASE in the last 168 hours. No results for input(s): AMMONIA in the last 168 hours. Coagulation Profile: No results for input(s): INR, PROTIME in the last 168 hours. Cardiac Enzymes: No results for input(s): CKTOTAL, CKMB, CKMBINDEX, TROPONINI in the last 168 hours. BNP (last 3 results) No results for input(s): PROBNP in the last 8760 hours. HbA1C: Recent Labs    12/12/19 0939  HGBA1C 5.1   CBG: Recent Labs  Lab 12/11/19 2334 12/12/19 0910 12/12/19 1616  GLUCAP 120* 125* 98   Lipid Profile: No results for input(s): CHOL, HDL, LDLCALC, TRIG, CHOLHDL, LDLDIRECT in the last 72 hours. Thyroid Function Tests: No results for input(s): TSH, T4TOTAL, FREET4, T3FREE, THYROIDAB in the last 72 hours. Anemia Panel: No results for input(s): VITAMINB12, FOLATE, FERRITIN, TIBC, IRON, RETICCTPCT in the last 72 hours. Sepsis Labs: No results for input(s): PROCALCITON, LATICACIDVEN in the last 168 hours.  Recent Results (from the past 240 hour(s))  SARS Coronavirus 2 by RT PCR (hospital order,  performed in [facility] hospital lab) Nasopharyngeal Nasopharyngeal Swab     Status: None   Collection Time: 12/11/19  7:43 PM   Specimen: Nasopharyngeal Swab  Result Value Ref Range Status   SARS Coronavirus 2 NEGATIVE NEGATIVE Final    Comment: (NOTE) SARS-CoV-2 target nucleic acids are NOT DETECTED.  The SARS-CoV-2 RNA is generally detectable in upper and lower respiratory specimens during the acute phase of infection. The lowest concentration of SARS-CoV-2 viral copies this assay can detect is 250 copies / mL. A negative result does not preclude SARS-CoV-2 infection and should not be used as the sole basis for treatment or other patient management decisions.  A negative result may occur with improper specimen collection / handling, submission of specimen other than nasopharyngeal swab, presence of viral mutation(s) within the areas targeted by this assay, and inadequate number of viral copies (<250 copies / mL). A negative result must be combined with clinical observations, patient history, and epidemiological information.  Fact Sheet for Patients:  BoilerBrush.com.cy  Fact Sheet for Healthcare Providers: https://pope.com/  This test is not yet approved or  cleared by the [place] FDA and has been authorized for detection and/or diagnosis of SARS-CoV-2 by FDA under an Emergency Use Authorization (EUA).  This EUA will remain in effect (meaning this test can be used) for the duration of the COVID-19 declaration under Section 564(b)(1) of the Act, 21 U.S.C. section 360bbb-3(b)(1), unless the authorization is terminated or revoked sooner.  Performed at [facility], [address], [place], [place] [address]       Radiology Studies: DG Chest 2 View  Result Date: 12/11/2019 CLINICAL DATA:  Elevated blood pressure in EKG changes EXAM: CHEST - 2 VIEW COMPARISON:  June 06, 2018 FINDINGS: Trachea midline.  Cardiomediastinal contours and hilar structures are accentuated by slightly low lung volumes and portable technique. Retrocardiac opacity behind the RIGHT heart border compatible with lower lobe process partially obscuring the medial aspect of the RIGHT hemidiaphragm and also seen on the lateral view. On limited assessment skeletal structures without acute process. IMPRESSION: Suspect RIGHT lower lobe airspace disease/pneumonia. Small lesion in the lung behind the RIGHT heart is also considered. Follow-up is suggested to ensure resolution. Electronically Signed   By: Donzetta Kohut M.D.   On: 12/11/2019 18:05   CT Head Wo Contrast  Result Date: 12/11/2019 CLINICAL DATA:  39 year old presenting with acute hypertension and 2 day history of headache. EXAM: CT HEAD WITHOUT CONTRAST TECHNIQUE: Contiguous axial images were obtained from the base of the skull through the vertex without intravenous contrast. COMPARISON:  CT head and MRI brain 06/06/2018. FINDINGS: Brain: Ventricular system normal in size and appearance for age. Mild changes of small vessel disease of the white matter diffusely, unchanged from the prior CT and MRI. No mass lesion. No midline shift. No acute hemorrhage or hematoma. No extra-axial fluid collections. No evidence of acute infarction. Vascular: No hyperdense vessel.  No visible atherosclerosis. Skull: No skull fracture or other focal osseous abnormality involving the skull. Sinuses/Orbits: Visualized paranasal sinuses, bilateral mastoid air cells and bilateral middle ear cavities well-aerated. Visualized orbits and globes normal in appearance. Other: None. IMPRESSION: 1. No acute intracranial abnormality. 2. Stable mild chronic microvascular ischemic changes of the white matter. Electronically Signed   By: Hulan Saas M.D.   On: 12/11/2019 18:08   ECHOCARDIOGRAM COMPLETE  Result Date: 12/12/2019    ECHOCARDIOGRAM REPORT   Patient Name:   ARMON ORVIS Date of Exam: 12/12/2019  Medical Rec #:  [id number]             Height:       65.0 in Accession #:    [id number]            Weight:       186.0 lb Date of Birth:  July 21, 1980             BSA:          1.918 m Patient Age:    39 years              BP:           170/110 mmHg Patient Gender: M                     HR:           83 bpm. Exam Location:  Inpatient Procedure: 2D Echo Indications:    elevated troponin  History:        Patient has  prior history of Echocardiogram examinations, most                 recent 12/14/2018. Risk Factors:Hypertension.  Sonographer:    Delcie Roch Referring Phys: [id number] CAROLE N HALL IMPRESSIONS  1. Left ventricular ejection fraction, by estimation, is 55 to 60%. The left ventricle has normal function. Left ventricular endocardial border not optimally defined to evaluate regional wall motion. Left ventricular diastolic parameters are indeterminate. Elevated left ventricular end-diastolic pressure.  2. Right ventricular systolic function is normal. The right ventricular size is normal. Tricuspid regurgitation signal is inadequate for assessing PA pressure.  3. The mitral valve is normal in structure. Trivial mitral valve regurgitation. No evidence of mitral stenosis.  4. The aortic valve is tricuspid. Aortic valve regurgitation is not visualized. Mild aortic valve sclerosis is present, with no evidence of aortic valve stenosis.  5. The inferior vena cava is normal in size with greater than 50% respiratory variability, suggesting right atrial pressure of 3 mmHg.  6. Recommend definity contrast study to assess the inferolateral and lateral walls. The endocardial borders are not well defined and suspicious for possible wall motion abnormality. FINDINGS  Left Ventricle: Left ventricular ejection fraction, by estimation, is 55 to 60%. The left ventricle has normal function. Left ventricular endocardial border not optimally defined to evaluate regional wall motion. The left ventricular internal cavity size was  normal in size. There is no left ventricular hypertrophy. Left ventricular diastolic parameters are indeterminate. Elevated left ventricular end-diastolic pressure. Right Ventricle: The right ventricular size is normal. No increase in right ventricular wall thickness. Right ventricular systolic function is normal. Tricuspid regurgitation signal is inadequate for assessing PA pressure. Left Atrium: Left atrial size was normal in size. Right Atrium: Right atrial size was normal in size. Pericardium: There is no evidence of pericardial effusion. Mitral Valve: The mitral valve is normal in structure. Trivial mitral valve regurgitation. No evidence of mitral valve stenosis. Tricuspid Valve: The tricuspid valve is normal in structure. Tricuspid valve regurgitation is not demonstrated. No evidence of tricuspid stenosis. Aortic Valve: The aortic valve is tricuspid. Aortic valve regurgitation is not visualized. Mild aortic valve sclerosis is present, with no evidence of aortic valve stenosis. Pulmonic Valve: The pulmonic valve was normal in structure. Pulmonic valve regurgitation is not visualized. No evidence of pulmonic stenosis. Aorta: The aortic root is normal in size and structure. Venous: The inferior vena cava is normal in size with greater than 50% respiratory variability, suggesting right atrial pressure of 3 mmHg. IAS/Shunts: No atrial level shunt detected by color flow Doppler.  LEFT VENTRICLE PLAX 2D LVIDd:         4.50 cm  Diastology LVIDs:         3.10 cm  LV e' medial:    6.09 cm/s LV PW:         1.20 cm  LV E/e' medial:  18.6 LV IVS:        1.10 cm  LV e' lateral:   6.96 cm/s LVOT diam:     1.80 cm  LV E/e' lateral: 16.2 LV SV:         51 LV SV Index:   27 LVOT Area:     2.54 cm  RIGHT VENTRICLE             IVC RV S prime:     14.50 cm/s  IVC diam: 1.20 cm TAPSE (M-mode): 2.2 cm LEFT ATRIUM  Index       RIGHT ATRIUM          Index LA diam:        3.90 cm 2.03 cm/m  RA Area:     8.18 cm LA Vol  (A2C):   45.2 ml 23.57 ml/m RA Volume:   14.10 ml 7.35 ml/m LA Vol (A4C):   52.8 ml 27.53 ml/m LA Biplane Vol: 51.0 ml 26.59 ml/m  AORTIC VALVE LVOT Vmax:   120.00 cm/s LVOT Vmean:  79.500 cm/s LVOT VTI:    0.200 m  AORTA Ao Root diam: 3.60 cm Ao Asc diam:  3.40 cm MITRAL VALVE MV Area (PHT): 4.06 cm     SHUNTS MV Decel Time: 187 msec     Systemic VTI:  0.20 m MV E velocity: 113.00 cm/s  Systemic Diam: 1.80 cm MV A velocity: 92.40 cm/s MV E/A ratio:  1.22 Armanda Magicraci Turner MD Electronically signed by Armanda Magicraci Turner MD Signature Date/Time: 12/12/2019/9:14:23 AM    Final       Scheduled Meds: . amLODipine  10 mg Oral Daily  . carvedilol  12.5 mg Oral BID WC  . dextromethorphan-guaiFENesin  2 tablet Oral BID  . enoxaparin (LOVENOX) injection  40 mg Subcutaneous Q24H  . hydrALAZINE  25 mg Oral Q8H   Continuous Infusions: . sodium chloride 75 mL/hr at 12/13/19 0950     LOS: 0 days      Time spent: 35 minutes   Noralee StainJennifer Susa Bones, DO [facility] 12/13/2019, 10:15 AM   Available via Epic secure chat 7am-7pm After these hours, please refer to coverage provider listed on [email or web address]

## 2019-12-14 LAB — LIPID PANEL
Cholesterol: 251 mg/dL — ABNORMAL HIGH (ref 0–200)
HDL: 38 mg/dL — ABNORMAL LOW (ref >40)
LDL Cholesterol: 163 mg/dL — ABNORMAL HIGH (ref 0–99)
Total CHOL/HDL Ratio: 6.6 RATIO
Triglycerides: 251 mg/dL — ABNORMAL HIGH (ref <150)
VLDL: 50 mg/dL — ABNORMAL HIGH (ref 0–40)

## 2019-12-14 LAB — BASIC METABOLIC PANEL
Anion gap: 8 (ref 5–15)
BUN: 16 mg/dL (ref 6–20)
CO2: 25 mmol/L (ref 22–32)
Calcium: 9.1 mg/dL (ref 8.9–10.3)
Chloride: 106 mmol/L (ref 98–111)
Creatinine, Ser: 1.3 mg/dL — ABNORMAL HIGH (ref 0.61–1.24)
GFR calc Af Amer: >60 mL/min (ref >60)
GFR calc non Af Amer: >60 mL/min (ref >60)
Glucose, Bld: 155 mg/dL — ABNORMAL HIGH (ref 70–99)
Potassium: 3.1 mmol/L — ABNORMAL LOW (ref 3.5–5.1)
Sodium: 139 mmol/L (ref 135–145)

## 2019-12-14 MED ORDER — INFLUENZA VAC SPLIT QUAD 0.5 ML IM SUSY
0.5000 mL | PREFILLED_SYRINGE | INTRAMUSCULAR | Status: AC
  Administered 2019-12-15: 0.5 mL via INTRAMUSCULAR
  Filled 2019-12-14: qty 0.5

## 2019-12-14 MED ORDER — LOSARTAN POTASSIUM 50 MG PO TABS
50.0000 mg | ORAL_TABLET | Freq: Every day | ORAL | Status: DC
  Administered 2019-12-14 – 2019-12-15 (×2): 50 mg via ORAL
  Filled 2019-12-14 (×2): qty 1

## 2019-12-14 MED ORDER — SPIRONOLACTONE 25 MG PO TABS
25.0000 mg | ORAL_TABLET | Freq: Every day | ORAL | Status: DC
  Administered 2019-12-14 – 2019-12-15 (×2): 25 mg via ORAL
  Filled 2019-12-14 (×2): qty 1

## 2019-12-14 MED ORDER — POTASSIUM CHLORIDE CRYS ER 20 MEQ PO TBCR
40.0000 meq | EXTENDED_RELEASE_TABLET | Freq: Once | ORAL | Status: AC
  Administered 2019-12-14: 40 meq via ORAL
  Filled 2019-12-14: qty 2

## 2019-12-14 NOTE — Progress Notes (Signed)
Progress Note  Patient Name: Kaylib Furness Date of Encounter: 12/14/2019  [facility] Cardiologist: Nanetta Batty, MD   Subjective   Sleeping, no SOB, no CP, No HA  Inpatient Medications    Scheduled Meds: . amLODipine  10 mg Oral Daily  . atorvastatin  40 mg Oral Daily  . carvedilol  25 mg Oral BID WC  . dextromethorphan-guaiFENesin  2 tablet Oral BID  . enoxaparin (LOVENOX) injection  40 mg Subcutaneous Q24H  . hydrALAZINE  25 mg Oral Q8H  . losartan  50 mg Oral Daily  . spironolactone  25 mg Oral Daily   Continuous Infusions:  PRN Meds: acetaminophen, hydrALAZINE, ondansetron (ZOFRAN) IV   Vital Signs    Vitals:   12/13/19 1235 12/13/19 1806 12/14/19 0039 12/14/19 0555  BP: (!) 160/111 (!) 176/117 (!) 158/104 (!) 181/128  Pulse: 81 94 81 71  Resp: 16 16 17 17   Temp: 98 F (36.7 C) 98.9 F (37.2 C) 98.4 F (36.9 C) 97.8 F (36.6 C)  TempSrc: Oral Oral Oral Oral  SpO2: 97% 98% 93% 98%  Weight:      Height:        Intake/Output Summary (Last 24 hours) at 12/14/2019 0744 Last data filed at 12/13/2019 1500 Gross per 24 hour  Intake 381.75 ml  Output --  Net 381.75 ml   Last 3 Weights 12/11/2019 01/14/2019 12/09/2018  Weight (lbs) 186 lb 178 lb 178 lb 2 oz  Weight (kg) 84.369 kg 80.74 kg 80.797 kg      Telemetry    NSR - Personally Reviewed  ECG    No new - Personally Reviewed  Physical Exam   GEN: No acute distress.   Neck: No JVD Cardiac: RRR, no murmurs, rubs, or gallops.  Respiratory: Clear to auscultation bilaterally. GI: Soft, nontender, non-distended  MS: No edema; No deformity. Neuro:  Nonfocal  Psych: Normal affect   Labs    High Sensitivity Troponin:   Recent Labs  Lab 12/11/19 1621 12/11/19 1739 12/11/19 1816 12/11/19 2230 12/12/19 0307  TROPONINIHS 44* 40* 54* 59* 35*      Chemistry Recent Labs  Lab 12/11/19 1621 12/12/19 0939 12/13/19 0302  NA 140 139 139  K 2.9* 3.8 3.3*  CL 99 106 104  CO2 28 22  23   GLUCOSE 163* 125* 116*  BUN 15 10 20   CREATININE 1.42* 1.21 1.66*  CALCIUM 9.9 9.5 9.9  GFRNONAA >60 >60 52*  GFRAA >60 >60 60*  ANIONGAP 13 11 12      Hematology Recent Labs  Lab 12/11/19 1621 12/12/19 0939  WBC 7.7 8.0  RBC 4.59 4.76  HGB 13.5 13.9  HCT 42.1 44.3  MCV 91.7 93.1  MCH 29.4 29.2  MCHC 32.1 31.4  RDW 13.2 13.2  PLT 327 325    BNPNo results for input(s): BNP, PROBNP in the last 168 hours.   DDimer No results for input(s): DDIMER in the last 168 hours.   Radiology    ECHOCARDIOGRAM COMPLETE  Result Date: 12/12/2019    ECHOCARDIOGRAM REPORT   Patient Name:   AYUUB PENLEY Date of Exam: 12/12/2019 Medical Rec #:  [id number]             Height:       65.0 in Accession #:    [id number]            Weight:       186.0 lb Date of Birth:  09/30/1980  BSA:          1.918 m Patient Age:    39 years              BP:           170/110 mmHg Patient Gender: M                     HR:           83 bpm. Exam Location:  Inpatient Procedure: 2D Echo Indications:    elevated troponin  History:        Patient has prior history of Echocardiogram examinations, most                 recent 12/14/2018. Risk Factors:Hypertension.  Sonographer:    Delcie RochLauren Pennington Referring Phys: [id number] CAROLE N HALL IMPRESSIONS  1. Left ventricular ejection fraction, by estimation, is 55 to 60%. The left ventricle has normal function. Left ventricular endocardial border not optimally defined to evaluate regional wall motion. Left ventricular diastolic parameters are indeterminate. Elevated left ventricular end-diastolic pressure.  2. Right ventricular systolic function is normal. The right ventricular size is normal. Tricuspid regurgitation signal is inadequate for assessing PA pressure.  3. The mitral valve is normal in structure. Trivial mitral valve regurgitation. No evidence of mitral stenosis.  4. The aortic valve is tricuspid. Aortic valve regurgitation is not visualized. Mild aortic  valve sclerosis is present, with no evidence of aortic valve stenosis.  5. The inferior vena cava is normal in size with greater than 50% respiratory variability, suggesting right atrial pressure of 3 mmHg.  6. Recommend definity contrast study to assess the inferolateral and lateral walls. The endocardial borders are not well defined and suspicious for possible wall motion abnormality. FINDINGS  Left Ventricle: Left ventricular ejection fraction, by estimation, is 55 to 60%. The left ventricle has normal function. Left ventricular endocardial border not optimally defined to evaluate regional wall motion. The left ventricular internal cavity size was normal in size. There is no left ventricular hypertrophy. Left ventricular diastolic parameters are indeterminate. Elevated left ventricular end-diastolic pressure. Right Ventricle: The right ventricular size is normal. No increase in right ventricular wall thickness. Right ventricular systolic function is normal. Tricuspid regurgitation signal is inadequate for assessing PA pressure. Left Atrium: Left atrial size was normal in size. Right Atrium: Right atrial size was normal in size. Pericardium: There is no evidence of pericardial effusion. Mitral Valve: The mitral valve is normal in structure. Trivial mitral valve regurgitation. No evidence of mitral valve stenosis. Tricuspid Valve: The tricuspid valve is normal in structure. Tricuspid valve regurgitation is not demonstrated. No evidence of tricuspid stenosis. Aortic Valve: The aortic valve is tricuspid. Aortic valve regurgitation is not visualized. Mild aortic valve sclerosis is present, with no evidence of aortic valve stenosis. Pulmonic Valve: The pulmonic valve was normal in structure. Pulmonic valve regurgitation is not visualized. No evidence of pulmonic stenosis. Aorta: The aortic root is normal in size and structure. Venous: The inferior vena cava is normal in size with greater than 50% respiratory  variability, suggesting right atrial pressure of 3 mmHg. IAS/Shunts: No atrial level shunt detected by color flow Doppler.  LEFT VENTRICLE PLAX 2D LVIDd:         4.50 cm  Diastology LVIDs:         3.10 cm  LV e' medial:    6.09 cm/s LV PW:         1.20 cm  LV E/e'  medial:  18.6 LV IVS:        1.10 cm  LV e' lateral:   6.96 cm/s LVOT diam:     1.80 cm  LV E/e' lateral: 16.2 LV SV:         51 LV SV Index:   27 LVOT Area:     2.54 cm  RIGHT VENTRICLE             IVC RV S prime:     14.50 cm/s  IVC diam: 1.20 cm TAPSE (M-mode): 2.2 cm LEFT ATRIUM             Index       RIGHT ATRIUM          Index LA diam:        3.90 cm 2.03 cm/m  RA Area:     8.18 cm LA Vol (A2C):   45.2 ml 23.57 ml/m RA Volume:   14.10 ml 7.35 ml/m LA Vol (A4C):   52.8 ml 27.53 ml/m LA Biplane Vol: 51.0 ml 26.59 ml/m  AORTIC VALVE LVOT Vmax:   120.00 cm/s LVOT Vmean:  79.500 cm/s LVOT VTI:    0.200 m  AORTA Ao Root diam: 3.60 cm Ao Asc diam:  3.40 cm MITRAL VALVE MV Area (PHT): 4.06 cm     SHUNTS MV Decel Time: 187 msec     Systemic VTI:  0.20 m MV E velocity: 113.00 cm/s  Systemic Diam: 1.80 cm MV A velocity: 92.40 cm/s MV E/A ratio:  1.22 Armanda Magic MD Electronically signed by Armanda Magic MD Signature Date/Time: 12/12/2019/9:14:23 AM    Final    ECHOCARDIOGRAM LIMITED  Result Date: 12/13/2019    ECHOCARDIOGRAM LIMITED REPORT   Patient Name:   TRAPPER MEECH Date of Exam: 12/13/2019 Medical Rec #:  [id number]             Height:       65.0 in Accession #:    [id number]            Weight:       186.0 lb Date of Birth:  1980-07-16             BSA:          1.918 m Patient Age:    39 years              BP:           170/110 mmHg Patient Gender: M                     HR:           88 bpm. Exam Location:  Inpatient Procedure: Limited Echo and Intracardiac Opacification Agent Indications:    Elevated Troponin  History:        Patient has prior history of Echocardiogram examinations, most                 recent 12/12/2019. Risk  Factors:Hypertension and Non-Smoker.  Sonographer:    Renella Cunas RDCS Referring Phys: [id number] JENNIFER CHOI IMPRESSIONS  1. Limited study for wall motion  2. Left ventricular ejection fraction, by estimation, is 50 to 55%. The left ventricle has low normal function. There is moderate hypokinesis of the left ventricular, entire inferior wall and lateral wall. Findings suggestive of LCx territory ischemia/infarct. Comparison(s): Changes from prior study are noted. 12/12/2019: LVEF 55-60%, possible inferior/inferolateral WMA. FINDINGS  Left Ventricle: Left ventricular ejection fraction, by estimation, is 50 to 55%.  The left ventricle has low normal function. Moderate hypokinesis of the left ventricular, entire inferior wall and lateral wall. Definity contrast agent was given IV to delineate the left ventricular endocardial borders.  LV Wall Scoring: The anterior wall, entire lateral wall, and basal inferior segment are hypokinetic. The entire septum, mid and distal inferior wall, apical anterior segment, and apex are normal. Findings suggestive of LCx territory ischemia/infarct. Zoila Shutter MD Electronically signed by Zoila Shutter MD Signature Date/Time: 12/13/2019/11:54:00 AM    Final     Cardiac Studies   ECHO 12/13/19 1. Limited study for wall motion  2. Left ventricular ejection fraction, by estimation, is 50 to 55%. The  left ventricle has low normal function. There is moderate hypokinesis of  the left ventricular, entire inferior wall and lateral wall. Findings  suggestive of LCx territory  ischemia/infarct.   Patient Profile     39 y.o. male with hypertensive urgency, mildly elevated troponin/demand ischemia, acute kidney injury  Assessment & Plan    Hypertensive urgency -Blood pressure today still markedly elevated.  No current neurologic sequelae. -Carvedilol currently 25 twice daily -Amlodipine 10 mg a day -Hydralazine 25 mg every 8 -I will go ahead and add spironolactone 25 mg a  day (potassium has been 2.9-3.3) understanding that this needs to be closely monitored with his mildly elevated creatinine (1.21-1.66) -I will also add losartan 50 mg a day.  Continue to monitor creatinine.  Mildly elevated troponin -Demand ischemia in the setting of extreme hypertension.  Wall motion abnormality on echocardiogram with low normal ejection fraction -In the inferolateral distribution.  Continue with aggressive blood pressure control, statin has been added.  No anginal symptoms.  If anginal symptoms were to develop, low threshold for further cardiac testing.       For questions or updates, please contact [facility] Please consult [email or web address] for contact info under        Signed, Donato Schultz, MD  12/14/2019, 7:44 AM

## 2019-12-14 NOTE — Progress Notes (Signed)
PROGRESS NOTE    John Savage  QAE:[id number] DOB: 1980/08/01 DOA: 12/11/2019 PCP: Bing Neighbors, FNP     Brief Narrative:  John Savage is a 39 y.o. male with medical history significant for essential hypertension, medication noncompliance who presented to [facility] ED from urgent care due to uncontrolled hypertension with SBP in the 230s.  Initially went to urgent care due to recurrent moderate headaches of 3 days duration, mainly located in the right side of the top of his head, no visual, motor, or sensory deficits.  Took over-the-counter Tylenol with mild improvement.  Associated with a nonproductive cough of 3 days duration.  His headache is worsened when he coughs.  While at urgent care his blood pressure was significantly elevated, had an abnormal twelve-lead EKG and was recommended to come to the ED for further evaluation.  Denies chest pain, dyspnea, or palpitation.  Ran out of his blood pressure medications 6 months ago.  Has been vaccinated for COVID-19 with [employer] vaccine in May 2021.  New events last 24 hours / Subjective: No new complaints on examination today  Assessment & Plan:   Active Problems:   Hypertensive emergency   Hypertensive emergency  -Blood pressure remains elevated -Continue Norvasc, Coreg, hydralazine.  Cardiology also added spironolactone, losartan  AKI -Baseline creatinine 1.0 -Improved overnight.  Monitor closely with initiation of spironolactone, losartan  NSTEMI -Troponin peaked at 59 -Echo showed moderate hypokinesis of the left ventricular, entire inferior wall and lateral wall. Findings  suggestive of LCx territory ischemia/infarct. Cardiology consulted  -Hold off on inpatient evaluation currently  Medical noncompliance -Has not taken his antihypertensives for the last 6 months  Hypokalemia -Replace, trend  Hyperlipidemia -Start Lipitor   DVT prophylaxis:  enoxaparin (LOVENOX) injection 40 mg Start:  12/11/19 2115  Code Status: Full code Family Communication: No family at bedside Disposition Plan:   Status is: Inpatient  Remains inpatient appropriate because:Hemodynamically unstable   Dispo:  Patient From: Home  Planned Disposition: Home  Expected discharge date: 12/15/19  Medically stable for discharge: No.  Blood pressure remains elevated 181/128 this morning.  Medications further adjusted by cardiology.   Consultants:   Cardiology   Procedures:   None  Antimicrobials:  Anti-infectives (From admission, onward)   None       Objective: Vitals:   12/13/19 1235 12/13/19 1806 12/14/19 0039 12/14/19 0555  BP: (!) 160/111 (!) 176/117 (!) 158/104 (!) 181/128  Pulse: 81 94 81 71  Resp: 16 16 17 17   Temp: 98 F (36.7 C) 98.9 F (37.2 C) 98.4 F (36.9 C) 97.8 F (36.6 C)  TempSrc: Oral Oral Oral Oral  SpO2: 97% 98% 93% 98%  Weight:      Height:        Intake/Output Summary (Last 24 hours) at 12/14/2019 1059 Last data filed at 12/13/2019 1500 Gross per 24 hour  Intake 381.75 ml  Output --  Net 381.75 ml   Filed Weights   12/11/19 1944  Weight: 84.4 kg    Examination: General exam: Appears calm and comfortable  Respiratory system: Clear to auscultation. Respiratory effort normal. Cardiovascular system: S1 & S2 heard, RRR. No pedal edema. Gastrointestinal system: Abdomen is nondistended, soft and nontender. Normal bowel sounds heard. Central nervous system: Alert and oriented. Non focal exam. Speech clear  Extremities: Symmetric in appearance bilaterally  Skin: No rashes, lesions or ulcers on exposed skin  Psychiatry: Judgement and insight appear stable. Mood & affect appropriate.    Data Reviewed: I have  personally reviewed following labs and imaging studies  CBC: Recent Labs  Lab 12/11/19 1621 12/12/19 0939  WBC 7.7 8.0  HGB 13.5 13.9  HCT 42.1 44.3  MCV 91.7 93.1  PLT 327 325   Basic Metabolic Panel: Recent Labs  Lab 12/11/19 1621  12/11/19 2230 12/12/19 0939 12/13/19 0302 12/14/19 0826  NA 140  --  139 139 139  K 2.9*  --  3.8 3.3* 3.1*  CL 99  --  106 104 106  CO2 28  --  22 23 25   GLUCOSE 163*  --  125* 116* 155*  BUN 15  --  10 20 16   CREATININE 1.42*  --  1.21 1.66* 1.30*  CALCIUM 9.9  --  9.5 9.9 9.1  MG  --  1.9  --   --   --    GFR: Estimated Creatinine Clearance: 77 mL/min (A) (by C-G formula based on SCr of 1.3 mg/dL (H)). Liver Function Tests: No results for input(s): AST, ALT, ALKPHOS, BILITOT, PROT, ALBUMIN in the last 168 hours. No results for input(s): LIPASE, AMYLASE in the last 168 hours. No results for input(s): AMMONIA in the last 168 hours. Coagulation Profile: No results for input(s): INR, PROTIME in the last 168 hours. Cardiac Enzymes: No results for input(s): CKTOTAL, CKMB, CKMBINDEX, TROPONINI in the last 168 hours. BNP (last 3 results) No results for input(s): PROBNP in the last 8760 hours. HbA1C: Recent Labs    12/12/19 0939  HGBA1C 5.1   CBG: Recent Labs  Lab 12/11/19 2334 12/12/19 0910 12/12/19 1616  GLUCAP 120* 125* 98   Lipid Profile: Recent Labs    12/14/19 0826  CHOL 251*  HDL 38*  LDLCALC 163*  TRIG 251*  CHOLHDL 6.6   Thyroid Function Tests: No results for input(s): TSH, T4TOTAL, FREET4, T3FREE, THYROIDAB in the last 72 hours. Anemia Panel: No results for input(s): VITAMINB12, FOLATE, FERRITIN, TIBC, IRON, RETICCTPCT in the last 72 hours. Sepsis Labs: No results for input(s): PROCALCITON, LATICACIDVEN in the last 168 hours.  Recent Results (from the past 240 hour(s))  SARS Coronavirus 2 by RT PCR (hospital order, performed in [place] hospital lab) Nasopharyngeal Nasopharyngeal Swab     Status: None   Collection Time: 12/11/19  7:43 PM   Specimen: Nasopharyngeal Swab  Result Value Ref Range Status   SARS Coronavirus 2 NEGATIVE NEGATIVE Final    Comment: (NOTE) SARS-CoV-2 target nucleic acids are NOT DETECTED.  The SARS-CoV-2 RNA is generally  detectable in upper and lower respiratory specimens during the acute phase of infection. The lowest concentration of SARS-CoV-2 viral copies this assay can detect is 250 copies / mL. A negative result does not preclude SARS-CoV-2 infection and should not be used as the sole basis for treatment or other patient management decisions.  A negative result may occur with improper specimen collection / handling, submission of specimen other than nasopharyngeal swab, presence of viral mutation(s) within the areas targeted by this assay, and inadequate number of viral copies (<250 copies / mL). A negative result must be combined with clinical observations, patient history, and epidemiological information.  Fact Sheet for Patients:   [facility]  Fact Sheet for Healthcare Providers: 02/10/20  This test is not yet approved or  cleared by the [employer] FDA and has been authorized for detection and/or diagnosis of SARS-CoV-2 by FDA under an Emergency Use Authorization (EUA).  This EUA will remain in effect (meaning this test can be used) for the duration of the COVID-19  declaration under Section 564(b)(1) of the Act, 21 U.S.C. section 360bbb-3(b)(1), unless the authorization is terminated or revoked sooner.  Performed at [facility], [address], [place], [place] [address]       Radiology Studies: ECHOCARDIOGRAM LIMITED  Result Date: 12/13/2019    ECHOCARDIOGRAM LIMITED REPORT   Patient Name:   John Savage Date of Exam: 12/13/2019 Medical Rec #:  [id number]             Height:       65.0 in Accession #:    [id number]            Weight:       186.0 lb Date of Birth:  May 24, 1980             BSA:          1.918 m Patient Age:    39 years              BP:           170/110 mmHg Patient Gender: M                     HR:           88 bpm. Exam Location:  Inpatient Procedure: Limited Echo and Intracardiac  Opacification Agent Indications:    Elevated Troponin  History:        Patient has prior history of Echocardiogram examinations, most                 recent 12/12/2019. Risk Factors:Hypertension and Non-Smoker.  Sonographer:    Renella Cunas RDCS Referring Phys: [id number] Coleton Woon IMPRESSIONS  1. Limited study for wall motion  2. Left ventricular ejection fraction, by estimation, is 50 to 55%. The left ventricle has low normal function. There is moderate hypokinesis of the left ventricular, entire inferior wall and lateral wall. Findings suggestive of LCx territory ischemia/infarct. Comparison(s): Changes from prior study are noted. 12/12/2019: LVEF 55-60%, possible inferior/inferolateral WMA. FINDINGS  Left Ventricle: Left ventricular ejection fraction, by estimation, is 50 to 55%. The left ventricle has low normal function. Moderate hypokinesis of the left ventricular, entire inferior wall and lateral wall. Definity contrast agent was given IV to delineate the left ventricular endocardial borders.  LV Wall Scoring: The anterior wall, entire lateral wall, and basal inferior segment are hypokinetic. The entire septum, mid and distal inferior wall, apical anterior segment, and apex are normal. Findings suggestive of LCx territory ischemia/infarct. Zoila Shutter MD Electronically signed by Zoila Shutter MD Signature Date/Time: 12/13/2019/11:54:00 AM    Final       Scheduled Meds: . amLODipine  10 mg Oral Daily  . atorvastatin  40 mg Oral Daily  . carvedilol  25 mg Oral BID WC  . enoxaparin (LOVENOX) injection  40 mg Subcutaneous Q24H  . hydrALAZINE  25 mg Oral Q8H  . losartan  50 mg Oral Daily  . spironolactone  25 mg Oral Daily   Continuous Infusions:    LOS: 1 day      Time spent: 25 minutes   Noralee Stain, DO [facility] 12/14/2019, 10:59 AM   Available via Epic secure chat 7am-7pm After these hours, please refer to coverage provider listed on [email or web address]

## 2019-12-15 DIAGNOSIS — R931 Abnormal findings on diagnostic imaging of heart and coronary circulation: Secondary | ICD-10-CM

## 2019-12-15 LAB — BASIC METABOLIC PANEL
Anion gap: 11 (ref 5–15)
BUN: 17 mg/dL (ref 6–20)
CO2: 23 mmol/L (ref 22–32)
Calcium: 9.2 mg/dL (ref 8.9–10.3)
Chloride: 106 mmol/L (ref 98–111)
Creatinine, Ser: 1.35 mg/dL — ABNORMAL HIGH (ref 0.61–1.24)
GFR calc Af Amer: >60 mL/min (ref >60)
GFR calc non Af Amer: >60 mL/min (ref >60)
Glucose, Bld: 131 mg/dL — ABNORMAL HIGH (ref 70–99)
Potassium: 3.4 mmol/L — ABNORMAL LOW (ref 3.5–5.1)
Sodium: 140 mmol/L (ref 135–145)

## 2019-12-15 LAB — MAGNESIUM: Magnesium: 1.8 mg/dL (ref 1.7–2.4)

## 2019-12-15 MED ORDER — ATORVASTATIN CALCIUM 40 MG PO TABS: 40.0000 mg | ORAL_TABLET | Freq: Every day | ORAL | 0 refills | Status: DC

## 2019-12-15 MED ORDER — HYDRALAZINE HCL 25 MG PO TABS: 25.0000 mg | ORAL_TABLET | Freq: Three times a day (TID) | ORAL | 0 refills | Status: DC

## 2019-12-15 MED ORDER — SPIRONOLACTONE 25 MG PO TABS: 25.0000 mg | ORAL_TABLET | Freq: Every day | ORAL | 0 refills | Status: DC

## 2019-12-15 MED ORDER — LOSARTAN POTASSIUM 50 MG PO TABS: 50.0000 mg | ORAL_TABLET | Freq: Every day | ORAL | 0 refills | Status: DC

## 2019-12-15 MED ORDER — AMLODIPINE BESYLATE 10 MG PO TABS: 10.0000 mg | ORAL_TABLET | Freq: Every day | ORAL | 0 refills | Status: DC

## 2019-12-15 MED ORDER — CARVEDILOL 25 MG PO TABS: 25.0000 mg | ORAL_TABLET | Freq: Two times a day (BID) | ORAL | 0 refills | Status: DC

## 2019-12-15 MED ORDER — POTASSIUM CHLORIDE CRYS ER 20 MEQ PO TBCR
30.0000 meq | EXTENDED_RELEASE_TABLET | ORAL | Status: AC
  Administered 2019-12-15: 30 meq via ORAL
  Filled 2019-12-15: qty 1

## 2019-12-15 MED FILL — AMLODIPINE BESYLATE 10 MG T: 10 | 30 days supply | Qty: 30 | Fill #0

## 2019-12-15 MED FILL — SPIRONOLACTONE 25 MG TABLET: 25 | 30 days supply | Qty: 30 | Fill #0

## 2019-12-15 MED FILL — hydrALAZINE HCL 25 MG TABS: 25 | 30 days supply | Qty: 90 | Fill #0

## 2019-12-15 MED FILL — LOSARTAN POTASSIUM 50 MG TA: 50 | 30 days supply | Qty: 30 | Fill #0

## 2019-12-15 MED FILL — ATORVASTATIN CALCIUM 40 MG: 40 | 30 days supply | Qty: 30 | Fill #0

## 2019-12-15 MED FILL — CARVEDILOL 25 MG TABLET: 25 | 30 days supply | Qty: 60 | Fill #0

## 2019-12-15 NOTE — Discharge Summary (Addendum)
Physician Discharge Summary  John Savage WUJ:[id number] DOB: [birth year] DOA: 12/11/2019  PCP: Bing Neighbors, FNP  Admit date: 12/11/2019 Discharge date: 12/15/2019  Admitted From: Home Disposition: Home  Recommendations for Outpatient Follow-up:  Follow up with PCP, scheduled with Gwinda Passe, NP on 12/23/2019 at 11:10 AM Follow-up with cardiology,Hao Lisabeth Devoid, PA on 12/20/2019 at 9:15 AM Started on carvedilol 25 mg p.o. twice daily, amlodipine 10 mg p.o. daily, hydralazine 25 mg p.o. every 8 hours, spironolactone 25 mg p.o. daily and losartan 50 mg p.o. daily for poorly controlled hypertension Please obtain BMP at next PCP/pulsatility visit Patient structured to keep a log of her blood pressures to take to next PCP and cardiology visit  Home Health: No Equipment/Devices: Blood pressure cuff  Discharge Condition: Stable CODE STATUS: Full code Diet recommendation: Heart healthy diet  History of present illness:  John Savage is a 39 y.o. male with medical history significant for essential hypertension, medication noncompliance who presented to [facility] ED from urgent care due to uncontrolled hypertension with SBP in the 230s.  Initially went to urgent care due to recurrent moderate headaches of 3 days duration, mainly located in the right side of the top of his head, no visual, motor, or sensory deficits.  Took over-the-counter Tylenol with mild improvement.  Associated with a nonproductive cough of 3 days duration.  His headache is worsened when he coughs.  While at urgent care his blood pressure was significantly elevated, had an abnormal twelve-lead EKG and was recommended to come to the ED for further evaluation.  Denies chest pain, dyspnea, or palpitation.  Ran out of his blood pressure medications 6 months ago.  Has been vaccinated for COVID-19 with [employer] vaccine in May 2021.  Hospital course:  Discharge Diagnoses:  Active Problems:   Hypertensive  emergency  Hypertensive emergency Patient initially presented to urgent care and referred to [facility], ED after being found with severely elevated blood pressures with systolics in the 230s.  Has been out of his antihypertensives at home for some time.  Patient was started on amlodipine 10 mg p.o. daily, hydralazine 25 mg every 8 hours, carvedilol 25 mg p.o. twice daily, spironolactone 25 mg p.o. daily and losartan 50 mg p.o. daily with improvement of his blood pressure.  Cardiology was consulted and followed during hospital course.  Cardiology with no further recommendations and recommends discharge with close follow-up in their outpatient clinic which is scheduled on 12/20/2019 at 9:15 AM.  Discussed with patient need for complete compliance with his antihypertensive regimen and to maintain blood pressure log on discharge to bring to next physician visits.  Acute renal failure Baseline creatinine 1.0.  Etiology likely secondary to poorly controlled hypertension.  Patient was started on antihypertensive therapy as above.  Patient's creatinine was monitored closely during hospitalization and was 1.35 at time of discharge.  Recommend repeat BMP in the next PCP/specialist visit.  NSTEMI, POA Troponin peaked at 59.  TTE notable for moderate hypokinesis left ventricle and entire inferior/lateral wall.  Cardiology consulted and followed during hospital course.  Currently holding off on any further inpatient evaluation given that he is asymptomatic and this is related likely secondary to his chronic poorly controlled hypertension as above.  Hypokalemia Repleted during hospitalization.  Recommend BMP at next PCP/specialist visit.  Hyperlipidemia: Started on atorvastatin 40 mg p.o. daily  Discharge Instructions  Discharge Instructions     Call MD for:  difficulty breathing, headache or visual disturbances   Complete by: As directed  Call MD for:  extreme fatigue   Complete by: As directed    Call  MD for:  persistant dizziness or light-headedness   Complete by: As directed    Call MD for:  persistant nausea and vomiting   Complete by: As directed    Call MD for:  severe uncontrolled pain   Complete by: As directed    Call MD for:  temperature >100.4   Complete by: As directed    Diet - low sodium heart healthy   Complete by: As directed    Increase activity slowly   Complete by: As directed       Allergies as of 12/15/2019       Reactions   Aspirin Other (See Comments)   Unknown reaction   Novocain [procaine] Other (See Comments)   Unknown reaction        Medication List     STOP taking these medications    hydrochlorothiazide 25 MG tablet Commonly known as: HYDRODIURIL   lisinopril 40 MG tablet Commonly known as: ZESTRIL       TAKE these medications    amLODipine 10 MG tablet Commonly known as: NORVASC Take 1 tablet (10 mg total) by mouth daily.   atorvastatin 40 MG tablet Commonly known as: LIPITOR Take 1 tablet (40 mg total) by mouth daily. Start taking on: December 16, 2019   carvedilol 25 MG tablet Commonly known as: COREG Take 1 tablet (25 mg total) by mouth 2 (two) times daily with a meal. What changed:  medication strength how much to take   hydrALAZINE 25 MG tablet Commonly known as: APRESOLINE Take 1 tablet (25 mg total) by mouth every 8 (eight) hours.   ibuprofen 200 MG tablet Commonly known as: ADVIL Take 400 mg by mouth every 6 (six) hours as needed (pain/headache).   losartan 50 MG tablet Commonly known as: COZAAR Take 1 tablet (50 mg total) by mouth daily. Start taking on: December 16, 2019   spironolactone 25 MG tablet Commonly known as: ALDACTONE Take 1 tablet (25 mg total) by mouth daily. Start taking on: December 16, 2019        Follow-up Information     Grayce Sessions, NP Follow up.   Specialty: Internal Medicine Why: Thursday December 23, 2019 at 1110  Contact information: [address] [place] [address] [phone]         Azalee Course, [place]. Go on 12/20/2019.   Specialties: Cardiology, Radiology Why: @9 :15am for hospital cardiology follow up Contact information: [address] [place] [phone]                Allergies  Allergen Reactions   Aspirin Other (See Comments)    Unknown reaction   Novocain [Procaine] Other (See Comments)    Unknown reaction    Consultations: Cardiology, heart care   Procedures/Studies: DG Chest 2 View  Result Date: 12/11/2019 CLINICAL DATA:  Elevated blood pressure in EKG changes EXAM: CHEST - 2 VIEW COMPARISON:  June 06, 2018 FINDINGS: Trachea midline. Cardiomediastinal contours and hilar structures are accentuated by slightly low lung volumes and portable technique. Retrocardiac opacity behind the RIGHT heart border compatible with lower lobe process partially obscuring the medial aspect of the RIGHT hemidiaphragm and also seen on the lateral view. On limited assessment skeletal structures without acute process. IMPRESSION: Suspect RIGHT lower lobe airspace disease/pneumonia. Small lesion in the lung behind the RIGHT heart is also considered. Follow-up is suggested to ensure resolution.  Electronically Signed   By: Donzetta Kohut M.D.   On: 12/11/2019 18:05   CT Head Wo Contrast  Result Date: 12/11/2019 CLINICAL DATA:  39 year old presenting with acute hypertension and 2 day history of headache. EXAM: CT HEAD WITHOUT CONTRAST TECHNIQUE: Contiguous axial images were obtained from the base of the skull through the vertex without intravenous contrast. COMPARISON:  CT head and MRI brain 06/06/2018. FINDINGS: Brain: Ventricular system normal in size and appearance for age. Mild changes of small vessel disease of the white matter diffusely, unchanged from the prior CT and MRI. No mass lesion. No midline shift. No acute hemorrhage or hematoma. No extra-axial fluid collections. No evidence of acute  infarction. Vascular: No hyperdense vessel.  No visible atherosclerosis. Skull: No skull fracture or other focal osseous abnormality involving the skull. Sinuses/Orbits: Visualized paranasal sinuses, bilateral mastoid air cells and bilateral middle ear cavities well-aerated. Visualized orbits and globes normal in appearance. Other: None. IMPRESSION: 1. No acute intracranial abnormality. 2. Stable mild chronic microvascular ischemic changes of the white matter. Electronically Signed   By: Hulan Saas M.D.   On: 12/11/2019 18:08   ECHOCARDIOGRAM COMPLETE  Result Date: 12/12/2019    ECHOCARDIOGRAM REPORT   Patient Name:   John Savage Date of Exam: 12/12/2019 Medical Rec #:  [id number]             Height:       65.0 in Accession #:    [id number]            Weight:       186.0 lb Date of Birth:  04-May-1980             BSA:          1.918 m Patient Age:    39 years              BP:           170/110 mmHg Patient Gender: M                     HR:           83 bpm. Exam Location:  Inpatient Procedure: 2D Echo Indications:    elevated troponin  History:        Patient has prior history of Echocardiogram examinations, most                 recent 12/14/2018. Risk Factors:Hypertension.  Sonographer:    Delcie Roch Referring Phys: [id number] CAROLE N HALL IMPRESSIONS  1. Left ventricular ejection fraction, by estimation, is 55 to 60%. The left ventricle has normal function. Left ventricular endocardial border not optimally defined to evaluate regional wall motion. Left ventricular diastolic parameters are indeterminate. Elevated left ventricular end-diastolic pressure.  2. Right ventricular systolic function is normal. The right ventricular size is normal. Tricuspid regurgitation signal is inadequate for assessing PA pressure.  3. The mitral valve is normal in structure. Trivial mitral valve regurgitation. No evidence of mitral stenosis.  4. The aortic valve is tricuspid. Aortic valve regurgitation is not  visualized. Mild aortic valve sclerosis is present, with no evidence of aortic valve stenosis.  5. The inferior vena cava is normal in size with greater than 50% respiratory variability, suggesting right atrial pressure of 3 mmHg.  6. Recommend definity contrast study to assess the inferolateral and lateral walls. The endocardial borders are not well defined and suspicious for possible wall motion abnormality. FINDINGS  Left Ventricle: Left ventricular ejection fraction, by  estimation, is 55 to 60%. The left ventricle has normal function. Left ventricular endocardial border not optimally defined to evaluate regional wall motion. The left ventricular internal cavity size was normal in size. There is no left ventricular hypertrophy. Left ventricular diastolic parameters are indeterminate. Elevated left ventricular end-diastolic pressure. Right Ventricle: The right ventricular size is normal. No increase in right ventricular wall thickness. Right ventricular systolic function is normal. Tricuspid regurgitation signal is inadequate for assessing PA pressure. Left Atrium: Left atrial size was normal in size. Right Atrium: Right atrial size was normal in size. Pericardium: There is no evidence of pericardial effusion. Mitral Valve: The mitral valve is normal in structure. Trivial mitral valve regurgitation. No evidence of mitral valve stenosis. Tricuspid Valve: The tricuspid valve is normal in structure. Tricuspid valve regurgitation is not demonstrated. No evidence of tricuspid stenosis. Aortic Valve: The aortic valve is tricuspid. Aortic valve regurgitation is not visualized. Mild aortic valve sclerosis is present, with no evidence of aortic valve stenosis. Pulmonic Valve: The pulmonic valve was normal in structure. Pulmonic valve regurgitation is not visualized. No evidence of pulmonic stenosis. Aorta: The aortic root is normal in size and structure. Venous: The inferior vena cava is normal in size with greater than  50% respiratory variability, suggesting right atrial pressure of 3 mmHg. IAS/Shunts: No atrial level shunt detected by color flow Doppler.  LEFT VENTRICLE PLAX 2D LVIDd:         4.50 cm  Diastology LVIDs:         3.10 cm  LV e' medial:    6.09 cm/s LV PW:         1.20 cm  LV E/e' medial:  18.6 LV IVS:        1.10 cm  LV e' lateral:   6.96 cm/s LVOT diam:     1.80 cm  LV E/e' lateral: 16.2 LV SV:         51 LV SV Index:   27 LVOT Area:     2.54 cm  RIGHT VENTRICLE             IVC RV S prime:     14.50 cm/s  IVC diam: 1.20 cm TAPSE (M-mode): 2.2 cm LEFT ATRIUM             Index       RIGHT ATRIUM          Index LA diam:        3.90 cm 2.03 cm/m  RA Area:     8.18 cm LA Vol (A2C):   45.2 ml 23.57 ml/m RA Volume:   14.10 ml 7.35 ml/m LA Vol (A4C):   52.8 ml 27.53 ml/m LA Biplane Vol: 51.0 ml 26.59 ml/m  AORTIC VALVE LVOT Vmax:   120.00 cm/s LVOT Vmean:  79.500 cm/s LVOT VTI:    0.200 m  AORTA Ao Root diam: 3.60 cm Ao Asc diam:  3.40 cm MITRAL VALVE MV Area (PHT): 4.06 cm     SHUNTS MV Decel Time: 187 msec     Systemic VTI:  0.20 m MV E velocity: 113.00 cm/s  Systemic Diam: 1.80 cm MV A velocity: 92.40 cm/s MV E/A ratio:  1.22 Armanda Magicraci Turner MD Electronically signed by Armanda Magicraci Turner MD Signature Date/Time: 12/12/2019/9:14:23 AM    Final    ECHOCARDIOGRAM LIMITED  Result Date: 12/13/2019    ECHOCARDIOGRAM LIMITED REPORT   Patient Name:   John Savage Date of Exam: 12/13/2019 Medical Rec #:  [id number]  Height:       65.0 in Accession #:    [id number]            Weight:       186.0 lb Date of Birth:  03/08/1981             BSA:          1.918 m Patient Age:    39 years              BP:           170/110 mmHg Patient Gender: M                     HR:           88 bpm. Exam Location:  Inpatient Procedure: Limited Echo and Intracardiac Opacification Agent Indications:    Elevated Troponin  History:        Patient has prior history of Echocardiogram examinations, most                 recent 12/12/2019.  Risk Factors:Hypertension and Non-Smoker.  Sonographer:    Renella Cunas RDCS Referring Phys: [id number] JENNIFER CHOI IMPRESSIONS  1. Limited study for wall motion  2. Left ventricular ejection fraction, by estimation, is 50 to 55%. The left ventricle has low normal function. There is moderate hypokinesis of the left ventricular, entire inferior wall and lateral wall. Findings suggestive of LCx territory ischemia/infarct. Comparison(s): Changes from prior study are noted. 12/12/2019: LVEF 55-60%, possible inferior/inferolateral WMA. FINDINGS  Left Ventricle: Left ventricular ejection fraction, by estimation, is 50 to 55%. The left ventricle has low normal function. Moderate hypokinesis of the left ventricular, entire inferior wall and lateral wall. Definity contrast agent was given IV to delineate the left ventricular endocardial borders.  LV Wall Scoring: The anterior wall, entire lateral wall, and basal inferior segment are hypokinetic. The entire septum, mid and distal inferior wall, apical anterior segment, and apex are normal. Findings suggestive of LCx territory ischemia/infarct. Zoila Shutter MD Electronically signed by Zoila Shutter MD Signature Date/Time: 12/13/2019/11:54:00 AM    Final      Subjective: Patient seen and examined bedside, sitting in bedside chair.  No complaints this morning.  Discussed with patient needs to maintain complete compliance with his antihypertensive regimen.  Discussed with cardiologist morning, okay for discharge home with close outpatient follow-up which is scheduled.  Patient denies headache, no visual changes, no chest pain, palpitations, no shortness of breath, no abdominal pain, no weakness, no fatigue, no paresthesias.  No acute events overnight per nursing staff.  Discharge Exam: Vitals:   12/15/19 0423 12/15/19 0813  BP: (!) 159/113 (!) 182/109  Pulse: 76   Resp: 18   Temp: 98 F (36.7 C)   SpO2: 98%    Vitals:   12/14/19 1758 12/15/19 0054 12/15/19 0423  12/15/19 0813  BP: (!) 160/112 (!) 152/105 (!) 159/113 (!) 182/109  Pulse: 81 86 76   Resp: Temp: 98.3 F (36.8 C) 98.4 F (36.9 C) 98 F (36.7 C)   TempSrc: Oral Oral Oral   SpO2: 99% 97% 98%   Weight:      Height:        General: Pt is alert, awake, not in acute distress Cardiovascular: RRR, S1/S2 +, no rubs, no gallops Respiratory: CTA bilaterally, no wheezing, no rhonchi Abdominal: Soft, NT, ND, bowel sounds + Extremities: no edema, no cyanosis    The results of significant  diagnostics from this hospitalization (including imaging, microbiology, ancillary and laboratory) are listed below for reference.     Microbiology: Recent Results (from the past 240 hour(s))  SARS Coronavirus 2 by RT PCR (hospital order, performed in [facility] hospital lab) Nasopharyngeal Nasopharyngeal Swab     Status: None   Collection Time: 12/11/19  7:43 PM   Specimen: Nasopharyngeal Swab  Result Value Ref Range Status   SARS Coronavirus 2 NEGATIVE NEGATIVE Final    Comment: (NOTE) SARS-CoV-2 target nucleic acids are NOT DETECTED.  The SARS-CoV-2 RNA is generally detectable in upper and lower respiratory specimens during the acute phase of infection. The lowest concentration of SARS-CoV-2 viral copies this assay can detect is 250 copies / mL. A negative result does not preclude SARS-CoV-2 infection and should not be used as the sole basis for treatment or other patient management decisions.  A negative result may occur with improper specimen collection / handling, submission of specimen other than nasopharyngeal swab, presence of viral mutation(s) within the areas targeted by this assay, and inadequate number of viral copies (<250 copies / mL). A negative result must be combined with clinical observations, patient history, and epidemiological information.  Fact Sheet for Patients:   BoilerBrush.com.cy  Fact Sheet for Healthcare  Providers: https://pope.com/  This test is not yet approved or  cleared by the [place] FDA and has been authorized for detection and/or diagnosis of SARS-CoV-2 by FDA under an Emergency Use Authorization (EUA).  This EUA will remain in effect (meaning this test can be used) for the duration of the COVID-19 declaration under Section 564(b)(1) of the Act, 21 U.S.C. section 360bbb-3(b)(1), unless the authorization is terminated or revoked sooner.  Performed at [facility], [address] [place], [place] [address]      Labs: BNP (last 3 results) No results for input(s): BNP in the last 8760 hours. Basic Metabolic Panel: Recent Labs  Lab 12/11/19 1621 12/11/19 2230 12/12/19 0939 12/13/19 0302 12/14/19 0826 12/15/19 0244  NA 140  --  139 139 139 140  K 2.9*  --  3.8 3.3* 3.1* 3.4*  CL 99  --  106 104 106 106  CO2 28  --  22 23 25 23   GLUCOSE 163*  --  125* 116* 155* 131*  BUN 15  --  10 20 16 17   CREATININE 1.42*  --  1.21 1.66* 1.30* 1.35*  CALCIUM 9.9  --  9.5 9.9 9.1 9.2  MG  --  1.9  --   --   --  1.8   Liver Function Tests: No results for input(s): AST, ALT, ALKPHOS, BILITOT, PROT, ALBUMIN in the last 168 hours. No results for input(s): LIPASE, AMYLASE in the last 168 hours. No results for input(s): AMMONIA in the last 168 hours. CBC: Recent Labs  Lab 12/11/19 1621 12/12/19 0939  WBC 7.7 8.0  HGB 13.5 13.9  HCT 42.1 44.3  MCV 91.7 93.1  PLT 327 325   Cardiac Enzymes: No results for input(s): CKTOTAL, CKMB, CKMBINDEX, TROPONINI in the last 168 hours. BNP: Invalid input(s): POCBNP CBG: Recent Labs  Lab 12/11/19 2334 12/12/19 0910 12/12/19 1616  GLUCAP 120* 125* 98   D-Dimer No results for input(s): DDIMER in the last 72 hours. Hgb A1c No results for input(s): HGBA1C in the last 72 hours. Lipid Profile Recent Labs    12/14/19 0826  CHOL 251*  HDL 38*  LDLCALC 163*  TRIG 251*  CHOLHDL 6.6   Thyroid  function  studies No results for input(s): TSH, T4TOTAL, T3FREE, THYROIDAB in the last 72 hours.  Invalid input(s): FREET3 Anemia work up No results for input(s): VITAMINB12, FOLATE, FERRITIN, TIBC, IRON, RETICCTPCT in the last 72 hours. Urinalysis    Component Value Date/Time   COLORURINE YELLOW 05/22/2015 1125   APPEARANCEUR CLEAR 05/22/2015 1125   LABSPEC 1.013 05/22/2015 1125   PHURINE 6.0 05/22/2015 1125   GLUCOSEU NEGATIVE 05/22/2015 1125   HGBUR NEGATIVE 05/22/2015 1125   BILIRUBINUR negative 06/23/2018 1347   KETONESUR negative 06/23/2018 1347   KETONESUR NEGATIVE 05/22/2015 1125   PROTEINUR NEGATIVE 05/22/2015 1125   UROBILINOGEN 0.2 06/23/2018 1347   UROBILINOGEN 0.2 03/21/2010 1728   NITRITE Negative 06/23/2018 1347   NITRITE NEGATIVE 05/22/2015 1125   LEUKOCYTESUR Negative 06/23/2018 1347   Sepsis Labs Invalid input(s): PROCALCITONIN,  WBC,  LACTICIDVEN Microbiology Recent Results (from the past 240 hour(s))  SARS Coronavirus 2 by RT PCR (hospital order, performed in [facility] hospital lab) Nasopharyngeal Nasopharyngeal Swab     Status: None   Collection Time: 12/11/19  7:43 PM   Specimen: Nasopharyngeal Swab  Result Value Ref Range Status   SARS Coronavirus 2 NEGATIVE NEGATIVE Final    Comment: (NOTE) SARS-CoV-2 target nucleic acids are NOT DETECTED.  The SARS-CoV-2 RNA is generally detectable in upper and lower respiratory specimens during the acute phase of infection. The lowest concentration of SARS-CoV-2 viral copies this assay can detect is 250 copies / mL. A negative result does not preclude SARS-CoV-2 infection and should not be used as the sole basis for treatment or other patient management decisions.  A negative result may occur with improper specimen collection / handling, submission of specimen other than nasopharyngeal swab, presence of viral mutation(s) within the areas targeted by this assay, and inadequate number of viral copies (<250 copies  / mL). A negative result must be combined with clinical observations, patient history, and epidemiological information.  Fact Sheet for Patients:   BoilerBrush.com.cy  Fact Sheet for Healthcare Providers: https://pope.com/  This test is not yet approved or  cleared by the [place] FDA and has been authorized for detection and/or diagnosis of SARS-CoV-2 by FDA under an Emergency Use Authorization (EUA).  This EUA will remain in effect (meaning this test can be used) for the duration of the COVID-19 declaration under Section 564(b)(1) of the Act, 21 U.S.C. section 360bbb-3(b)(1), unless the authorization is terminated or revoked sooner.  Performed at [facility], [address], [place], [place] [address]      Time coordinating discharge: Over 30 minutes  SIGNED:   Alvira Philips [place], DO  [facility] 12/15/2019, 11:10 AM

## 2019-12-15 NOTE — Progress Notes (Signed)
RN gave pt discharge instructions and he stated understanding. IV has been removed and he is waiting for pharmacy to drop off his medications and he will call his ride.

## 2019-12-15 NOTE — Progress Notes (Signed)
[name] to be D/C'd Home per MD order.  Discussed with the patient and all questions fully answered.   VSS, Skin clean, dry and intact without evidence of skin break down, no evidence of skin tears noted. IV catheter discontinued intact. Site without signs and symptoms of complications. Dressing and pressure applied.   An After Visit Summary was printed and given to the patient.    D/C education completed with patient/family including follow up instructions, medication list, d/c activities limitations if indicated, with other d/c instructions as indicated by MD - patient able to verbalize understanding, all questions fully answered.    Patient instructed to return to ED, call 911, or call MD for any changes in condition.    Patient escorted out, and D/C home via car.

## 2019-12-15 NOTE — Discharge Instructions (Signed)
Blood Pressure Record Sheet To take your blood pressure, you will need a blood pressure machine. You can buy a blood pressure machine (blood pressure monitor) at your clinic, drug store, or online. When choosing one, consider:  An automatic monitor that has an arm cuff.  A cuff that wraps snugly around your upper arm. You should be able to fit only one finger between your arm and the cuff.  A device that stores blood pressure reading results.  Do not choose a monitor that measures your blood pressure from your wrist or finger. Follow your health care provider's instructions for how to take your blood pressure. To use this form:  Get one reading in the morning (a.m.) before you take any medicines.  Get one reading in the evening (p.m.) before supper.  Take at least 2 readings with each blood pressure check. This makes sure the results are correct. Wait 1-2 minutes between measurements.  Write down the results in the spaces on this form.  Repeat this once a week, or as told by your health care provider.  Make a follow-up appointment with your health care provider to discuss the results. Blood pressure log Date: _______________________  a.m. _____________________(1st reading) _____________________(2nd reading)  p.m. _____________________(1st reading) _____________________(2nd reading) Date: _______________________  a.m. _____________________(1st reading) _____________________(2nd reading)  p.m. _____________________(1st reading) _____________________(2nd reading) Date: _______________________  a.m. _____________________(1st reading) _____________________(2nd reading)  p.m. _____________________(1st reading) _____________________(2nd reading) Date: _______________________  a.m. _____________________(1st reading) _____________________(2nd reading)  p.m. _____________________(1st reading) _____________________(2nd reading) Date: _______________________  a.m.  _____________________(1st reading) _____________________(2nd reading)  p.m. _____________________(1st reading) _____________________(2nd reading) This information is not intended to replace advice given to you by your health care provider. Make sure you discuss any questions you have with your health care provider. Document Revised: 05/16/2017 Document Reviewed: 03/18/2017 Elsevier Patient Education  2020 [employer]. Hypertension, Adult Hypertension is another name for high blood pressure. High blood pressure forces your heart to work harder to pump blood. This can cause problems over time. There are two numbers in a blood pressure reading. There is a top number (systolic) over a bottom number (diastolic). It is best to have a blood pressure that is below 120/80. Healthy choices can help lower your blood pressure, or you may need medicine to help lower it. What are the causes? The cause of this condition is not known. Some conditions may be related to high blood pressure. What increases the risk?  Smoking.  Having type 2 diabetes mellitus, high cholesterol, or both.  Not getting enough exercise or physical activity.  Being overweight.  Having too much fat, sugar, calories, or salt (sodium) in your diet.  Drinking too much alcohol.  Having long-term (chronic) kidney disease.  Having a family history of high blood pressure.  Age. Risk increases with age.  Race. You may be at higher risk if you are African American.  Gender. Men are at higher risk than women before age 43. After age 95, women are at higher risk than men.  Having obstructive sleep apnea.  Stress. What are the signs or symptoms?  High blood pressure may not cause symptoms. Very high blood pressure (hypertensive crisis) may cause: ? Headache. ? Feelings of worry or nervousness (anxiety). ? Shortness of breath. ? Nosebleed. ? A feeling of being sick to your stomach (nausea). ? Throwing up  (vomiting). ? Changes in how you see. ? Very bad chest pain. ? Seizures. How is this treated?  This condition is treated by making healthy lifestyle changes, such  as: ? Eating healthy foods. ? Exercising more. ? Drinking less alcohol.  Your health care provider may prescribe medicine if lifestyle changes are not enough to get your blood pressure under control, and if: ? Your top number is above 130. ? Your bottom number is above 80.  Your personal target blood pressure may vary. Follow these instructions at home: Eating and drinking   If told, follow the DASH eating plan. To follow this plan: ? Fill one half of your plate at each meal with fruits and vegetables. ? Fill one fourth of your plate at each meal with whole grains. Whole grains include whole-wheat pasta, brown rice, and whole-grain bread. ? Eat or drink low-fat dairy products, such as skim milk or low-fat yogurt. ? Fill one fourth of your plate at each meal with low-fat (lean) proteins. Low-fat proteins include fish, chicken without skin, eggs, beans, and tofu. ? Avoid fatty meat, cured and processed meat, or chicken with skin. ? Avoid pre-made or processed food.  Eat less than 1,500 mg of salt each day.  Do not drink alcohol if: ? Your doctor tells you not to drink. ? You are pregnant, may be pregnant, or are planning to become pregnant.  If you drink alcohol: ? Limit how much you use to:  0-1 drink a day for women.  0-2 drinks a day for men. ? Be aware of how much alcohol is in your drink. In the [place] one drink equals one 12 oz bottle of beer (355 mL), one 5 oz glass of wine (148 mL), or one 1 oz glass of hard liquor (44 mL). Lifestyle   Work with your doctor to stay at a healthy weight or to lose weight. Ask your doctor what the best weight is for you.  Get at least 30 minutes of exercise most days of the week. This may include walking, swimming, or biking.  Get at least 30 minutes of exercise that  strengthens your muscles (resistance exercise) at least 3 days a week. This may include lifting weights or doing Pilates.  Do not use any products that contain nicotine or tobacco, such as cigarettes, e-cigarettes, and chewing tobacco. If you need help quitting, ask your doctor.  Check your blood pressure at home as told by your doctor.  Keep all follow-up visits as told by your doctor. This is important. Medicines  Take over-the-counter and prescription medicines only as told by your doctor. Follow directions carefully.  Do not skip doses of blood pressure medicine. The medicine does not work as well if you skip doses. Skipping doses also puts you at risk for problems.  Ask your doctor about side effects or reactions to medicines that you should watch for. Contact a doctor if you:  Think you are having a reaction to the medicine you are taking.  Have headaches that keep coming back (recurring).  Feel dizzy.  Have swelling in your ankles.  Have trouble with your vision. Get help right away if you:  Get a very bad headache.  Start to feel mixed up (confused).  Feel weak or numb.  Feel faint.  Have very bad pain in your: ? Chest. ? Belly (abdomen).  Throw up more than once.  Have trouble breathing. Summary  Hypertension is another name for high blood pressure.  High blood pressure forces your heart to work harder to pump blood.  For most people, a normal blood pressure is less than 120/80.  Making healthy choices can help lower blood pressure.  If your blood pressure does not get lower with healthy choices, you may need to take medicine. This information is not intended to replace advice given to you by your health care provider. Make sure you discuss any questions you have with your health care provider. Document Revised: 11/26/2017 Document Reviewed: 11/26/2017 Elsevier Patient Education  2020 [employer].  Managing Your Hypertension Hypertension is commonly  called high blood pressure. This is when the force of your blood pressing against the walls of your arteries is too strong. Arteries are blood vessels that carry blood from your heart throughout your body. Hypertension forces the heart to work harder to pump blood, and may cause the arteries to become narrow or stiff. Having untreated or uncontrolled hypertension can cause heart attack, stroke, kidney disease, and other problems. What are blood pressure readings? A blood pressure reading consists of a higher number over a lower number. Ideally, your blood pressure should be below 120/80. The first ("top") number is called the systolic pressure. It is a measure of the pressure in your arteries as your heart beats. The second ("bottom") number is called the diastolic pressure. It is a measure of the pressure in your arteries as the heart relaxes. What does my blood pressure reading mean? Blood pressure is classified into four stages. Based on your blood pressure reading, your health care provider may use the following stages to determine what type of treatment you need, if any. Systolic pressure and diastolic pressure are measured in a unit called mm Hg. Normal  Systolic pressure: below 120.  Diastolic pressure: below 80. Elevated  Systolic pressure: 120-129.  Diastolic pressure: below 80. Hypertension stage 1  Systolic pressure: 130-139.  Diastolic pressure: 80-89. Hypertension stage 2  Systolic pressure: 140 or above.  Diastolic pressure: 90 or above. What health risks are associated with hypertension? Managing your hypertension is an important responsibility. Uncontrolled hypertension can lead to:  A heart attack.  A stroke.  A weakened blood vessel (aneurysm).  Heart failure.  Kidney damage.  Eye damage.  Metabolic syndrome.  Memory and concentration problems. What changes can I make to manage my hypertension? Hypertension can be managed by making lifestyle changes and  possibly by taking medicines. Your health care provider will help you make a plan to bring your blood pressure within a normal range. Eating and drinking   Eat a diet that is high in fiber and potassium, and low in salt (sodium), added sugar, and fat. An example eating plan is called the DASH (Dietary Approaches to Stop Hypertension) diet. To eat this way: ? Eat plenty of fresh fruits and vegetables. Try to fill half of your plate at each meal with fruits and vegetables. ? Eat whole grains, such as whole wheat pasta, brown rice, or whole grain bread. Fill about one quarter of your plate with whole grains. ? Eat low-fat diary products. ? Avoid fatty cuts of meat, processed or cured meats, and poultry with skin. Fill about one quarter of your plate with lean proteins such as fish, chicken without skin, beans, eggs, and tofu. ? Avoid premade and processed foods. These tend to be higher in sodium, added sugar, and fat.  Reduce your daily sodium intake. Most people with hypertension should eat less than 1,500 mg of sodium a day.  Limit alcohol intake to no more than 1 drink a day for nonpregnant women and 2 drinks a day for men. One drink equals 12 oz of beer, 5 oz of wine, or 1 oz  of hard liquor. Lifestyle  Work with your health care provider to maintain a healthy body weight, or to lose weight. Ask what an ideal weight is for you.  Get at least 30 minutes of exercise that causes your heart to beat faster (aerobic exercise) most days of the week. Activities may include walking, swimming, or biking.  Include exercise to strengthen your muscles (resistance exercise), such as weight lifting, as part of your weekly exercise routine. Try to do these types of exercises for 30 minutes at least 3 days a week.  Do not use any products that contain nicotine or tobacco, such as cigarettes and e-cigarettes. If you need help quitting, ask your health care provider.  Control any long-term (chronic) conditions  you have, such as high cholesterol or diabetes. Monitoring  Monitor your blood pressure at home as told by your health care provider. Your personal target blood pressure may vary depending on your medical conditions, your age, and other factors.  Have your blood pressure checked regularly, as often as told by your health care provider. Working with your health care provider  Review all the medicines you take with your health care provider because there may be side effects or interactions.  Talk with your health care provider about your diet, exercise habits, and other lifestyle factors that may be contributing to hypertension.  Visit your health care provider regularly. Your health care provider can help you create and adjust your plan for managing hypertension. Will I need medicine to control my blood pressure? Your health care provider may prescribe medicine if lifestyle changes are not enough to get your blood pressure under control, and if:  Your systolic blood pressure is 130 or higher.  Your diastolic blood pressure is 80 or higher. Take medicines only as told by your health care provider. Follow the directions carefully. Blood pressure medicines must be taken as prescribed. The medicine does not work as well when you skip doses. Skipping doses also puts you at risk for problems. Contact a health care provider if:  You think you are having a reaction to medicines you have taken.  You have repeated (recurrent) headaches.  You feel dizzy.  You have swelling in your ankles.  You have trouble with your vision. Get help right away if:  You develop a severe headache or confusion.  You have unusual weakness or numbness, or you feel faint.  You have severe pain in your chest or abdomen.  You vomit repeatedly.  You have trouble breathing. Summary  Hypertension is when the force of blood pumping through your arteries is too strong. If this condition is not controlled, it may  put you at risk for serious complications.  Your personal target blood pressure may vary depending on your medical conditions, your age, and other factors. For most people, a normal blood pressure is less than 120/80.  Hypertension is managed by lifestyle changes, medicines, or both. Lifestyle changes include weight loss, eating a healthy, low-sodium diet, exercising more, and limiting alcohol. This information is not intended to replace advice given to you by your health care provider. Make sure you discuss any questions you have with your health care provider. Document Revised: 07/10/2018 Document Reviewed: 02/14/2016 Elsevier Patient Education  2020 [employer].

## 2019-12-15 NOTE — Progress Notes (Addendum)
Progress Note  Patient Name: John Savage Date of Encounter: 12/15/2019  [facility] Cardiologist: Nanetta Batty, MD   Subjective   Feeling well. No chest pain, sob or palpitations.   Inpatient Medications    Scheduled Meds:  amLODipine  10 mg Oral Daily   atorvastatin  40 mg Oral Daily   carvedilol  25 mg Oral BID WC   enoxaparin (LOVENOX) injection  40 mg Subcutaneous Q24H   hydrALAZINE  25 mg Oral Q8H   losartan  50 mg Oral Daily   potassium chloride  30 mEq Oral Q3H   spironolactone  25 mg Oral Daily   Continuous Infusions:  PRN Meds: acetaminophen, hydrALAZINE, ondansetron (ZOFRAN) IV   Vital Signs    Vitals:   12/14/19 1758 12/15/19 0054 12/15/19 0423 12/15/19 0813  BP: (!) 160/112 (!) 152/105 (!) 159/113 (!) 182/109  Pulse: 81 86 76   Resp: 17 18 18    Temp: 98.3 F (36.8 C) 98.4 F (36.9 C) 98 F (36.7 C)   TempSrc: Oral Oral Oral   SpO2: 99% 97% 98%   Weight:      Height:       No intake or output data in the 24 hours ending 12/15/19 1005 Last 3 Weights 12/11/2019 01/14/2019 12/09/2018  Weight (lbs) 186 lb 178 lb 178 lb 2 oz  Weight (kg) 84.369 kg 80.74 kg 80.797 kg      Telemetry    NSR - Personally Reviewed  ECG    N/A  Physical Exam   GEN: No acute distress.   Neck: No JVD Cardiac: RRR, no murmurs, rubs, or gallops.  Respiratory: Clear to auscultation bilaterally. GI: Soft, nontender, non-distended  MS: No edema; No deformity. Neuro:  Nonfocal  Psych: Normal affect   Labs    High Sensitivity Troponin:   Recent Labs  Lab 12/11/19 1621 12/11/19 1739 12/11/19 1816 12/11/19 2230 12/12/19 0307  TROPONINIHS 44* 40* 54* 59* 35*      Chemistry Recent Labs  Lab 12/13/19 0302 12/14/19 0826 12/15/19 0244  NA 139 139 140  K 3.3* 3.1* 3.4*  CL 104 106 106  CO2 23 25 23   GLUCOSE 116* 155* 131*  BUN 20 16 17   CREATININE 1.66* 1.30* 1.35*  CALCIUM 9.9 9.1 9.2  GFRNONAA 52* >60 >60  GFRAA 60* >60 >60    ANIONGAP 12 8 11      Hematology Recent Labs  Lab 12/11/19 1621 12/12/19 0939  WBC 7.7 8.0  RBC 4.59 4.76  HGB 13.5 13.9  HCT 42.1 44.3  MCV 91.7 93.1  MCH 29.4 29.2  MCHC 32.1 31.4  RDW 13.2 13.2  PLT 327 325     Radiology    No results found.  Cardiac Studies   Echo 12/12/19 1. Left ventricular ejection fraction, by estimation, is 55 to 60%. The  left ventricle has normal function. Left ventricular endocardial border  not optimally defined to evaluate regional wall motion. Left ventricular  diastolic parameters are  indeterminate. Elevated left ventricular end-diastolic pressure.  2. Right ventricular systolic function is normal. The right ventricular  size is normal. Tricuspid regurgitation signal is inadequate for assessing  PA pressure.  3. The mitral valve is normal in structure. Trivial mitral valve  regurgitation. No evidence of mitral stenosis.  4. The aortic valve is tricuspid. Aortic valve regurgitation is not  visualized. Mild aortic valve sclerosis is present, with no evidence of  aortic valve stenosis.  5. The inferior vena cava is normal in size  with greater than 50%  respiratory variability, suggesting right atrial pressure of 3 mmHg.  6. Recommend definity contrast study to assess the inferolateral and  lateral walls. The endocardial borders are not well defined and suspicious  for possible wall motion abnormality.   Limited echo 12/13/19 1. Limited study for wall motion  2. Left ventricular ejection fraction, by estimation, is 50 to 55%. The  left ventricle has low normal function. There is moderate hypokinesis of  the left ventricular, entire inferior wall and lateral wall. Findings  suggestive of LCx territory  ischemia/infarct.   Patient Profile     39 y.o. male with hx of HTN and non compliance admitted with hypertensive urgency.   Assessment & Plan    1. Hypertensive Urgency -BP improving but remains elevated.  - BP was  182/109 this morning prior getting his all antihypertensive.  - Continue Amlodipine 10mg  qd, Coreg 25mg  BID, Hydralazine 25mg  TID, Losartan 50mg  qd and spironolactone 25mg  qd.  - Hopefully BP improves further with ambulation - Discussed importance of compliance - Will arrange to give Blood pressure cuff and medications [facility] pharmacy - Close follow up in clinic with [facility] on Monday - Give additional hydralazine PRN for elevated BP  2. Elevated troponin  - Demand ischemia in setting of extreme hypertension  3. Wall motion abnormality on Echo - Normal LVEF - here is moderate hypokinesis of  the left ventricular, entire inferior wall and lateral wall. Findings  suggestive of LCx territory  ischemia/infarct.  - No anginal symptoms - Continue statin   Ambulate and recheck BP.   For questions or updates, please contact [facility] Please consult [email or web address] for contact info under      Signed , PA  12/15/2019, 10:05 AM    Personally seen and examined. Agree with above.   He is not having any shortness of breath no chest pain no headaches no neurologic issues with regards to his hypertension which has been longstanding and exacerbated by over 6 months of medication noncompliance.  I think that it is going to take several days/weeks of continued adjustments of his antihypertensives to see effect.  Lungs are clear heart is regular alert and oriented x3 with no focal abnormalities.  Lab work reviewed as above.  I would like to go ahead and continue with our current medication strategy as above.  I think it is reasonable to allow him to be discharged with close clinic follow-up.  We are going to set him up in our hypertension clinic.  We are going to check a basic metabolic profile soon.  I have expressed to him the importance of good blood pressure control to help mitigate risks of MI stroke renal impairment.  , MD

## 2019-12-15 NOTE — TOC Transition Note (Addendum)
Transition of Care [facility]) - CM/SW Discharge Note   Patient Details  Name: John Savage MRN: [id number] Date of Birth: December 26, 1980  [facility] CM/SW Contact:  Kingsley Plan, RN Phone Number: 12/15/2019, 11:03 AM   Clinical Narrative:     Provided patient with a blood pressure cuff to take home. Explained to keep a record date/time and BP and to take to follow up appointment. Patient voiced understanding.  Pharmacy changed to [facility]. [facility] Pharmacy will fill scripts and bring to room. Patient is responsible for any co pays.   Final next level of care: Home/Self Care Barriers to Discharge: Continued Medical Work up   Patient Goals and CMS Choice Patient states their goals for this hospitalization and ongoing recovery are:: to return to home CMS Medicare.gov Compare Post Acute Care list provided to:: Patient Choice offered to / list presented to : Patient  Discharge Placement                       Discharge Plan and Services   Discharge Planning Services: [facility]              DME Agency: NA       HH Arranged: NA          Social Determinants of Health (SDOH) Interventions     Readmission Risk Interventions No flowsheet data found.

## 2019-12-16 ENCOUNTER — Telehealth: Payer: Self-pay

## 2019-12-16 NOTE — Telephone Encounter (Signed)
Transition Care Management Follow-up Telephone Call Date of discharge and from where: 12/15/2019, [facility]  Call placed to patient # [phone], message left with call back requested to this CM. Call placed to # [phone], his wife answered and said that he was at work. She said he has lunch around 12:00 and may call back at that time.  Patient as follow up appointment scheduled at [facility] 12/23/2019.

## 2019-12-17 ENCOUNTER — Telehealth: Payer: Self-pay

## 2019-12-17 NOTE — Telephone Encounter (Signed)
Transition Care Management Follow-up Telephone Call Attempt # 2  Date of discharge and from where: 12/15/2019, [facility]  Call placed to patient # [phone], message left with call back requested to this CM.   Patient as follow up appointment scheduled at [facility] 12/23/2019.

## 2019-12-20 ENCOUNTER — Encounter: Payer: Managed Care, Other (non HMO) | Admitting: Physician Assistant

## 2019-12-20 NOTE — Progress Notes (Signed)
This encounter was created in error - please disregard.

## 2019-12-23 ENCOUNTER — Inpatient Hospital Stay (INDEPENDENT_AMBULATORY_CARE_PROVIDER_SITE_OTHER): Payer: Managed Care, Other (non HMO) | Admitting: Primary Care

## 2019-12-23 ENCOUNTER — Telehealth (INDEPENDENT_AMBULATORY_CARE_PROVIDER_SITE_OTHER): Payer: Managed Care, Other (non HMO) | Admitting: Primary Care

## 2019-12-23 DIAGNOSIS — I10 Essential (primary) hypertension: Secondary | ICD-10-CM | POA: Diagnosis not present

## 2019-12-23 DIAGNOSIS — E782 Mixed hyperlipidemia: Secondary | ICD-10-CM

## 2019-12-23 DIAGNOSIS — Z09 Encounter for follow-up examination after completed treatment for conditions other than malignant neoplasm: Secondary | ICD-10-CM | POA: Diagnosis not present

## 2019-12-23 NOTE — Progress Notes (Signed)
Virtual Visit I connected with John Savage on 12/23/19 at  3:50 PM EDT by telephone and verified that I am speaking with the correct person using two identifiers.  Patient is at home I discussed the limitations, risks, security and privacy concerns of performing an evaluation and management service by telephone and the availability of in person appointments. I also discussed with the patient that there may be a patient responsible charge related to this service. The patient expressed understanding and agreed to proceed.  Gwinda Passe is in the office at [facility]  HPI  John Savage39 y.o.male presents for follow up from the hospital. to [facility]hospital was 12/11/19, patient was discharged from the hospital on 12/15/19, patient was admitted for: Hypertensive emergency  Past Medical History:  Diagnosis Date  . Hypertension      Allergies  Allergen Reactions  . Aspirin Other (See Comments)    Unknown reaction  . Novocain [Procaine] Other (See Comments)    Unknown reaction      Current Outpatient Medications on File Prior to Visit  Medication Sig Dispense Refill  . amLODipine (NORVASC) 10 MG tablet Take 1 tablet (10 mg total) by mouth daily. 90 tablet 0  . atorvastatin (LIPITOR) 40 MG tablet Take 1 tablet (40 mg total) by mouth daily. 90 tablet 0  . carvedilol (COREG) 25 MG tablet Take 1 tablet (25 mg total) by mouth 2 (two) times daily with a meal. 180 tablet 0  . hydrALAZINE (APRESOLINE) 25 MG tablet Take 1 tablet (25 mg total) by mouth every 8 (eight) hours. 270 tablet 0  . ibuprofen (ADVIL) 200 MG tablet Take 400 mg by mouth every 6 (six) hours as needed (pain/headache).    . losartan (COZAAR) 50 MG tablet Take 1 tablet (50 mg total) by mouth daily. 90 tablet 0  . spironolactone (ALDACTONE) 25 MG tablet Take 1 tablet (25 mg total) by mouth daily. 90 tablet 0   No current facility-administered medications on file prior to visit.  Review  of Systems  All other systems reviewed and are negative.   Diagnoses and all orders for this visit:  Hospital discharge follow-up Retrieved from hospital discharge on 12/15/2019 Recommendations for Outpatient Follow-up:  1. Follow up with PCP, scheduled with Gwinda Passe, NP on 12/23/2019 at 11:10 AM completed  2. Follow-up with cardiology,Hao Lisabeth Devoid, PA on 12/20/2019 at 9:15 AM 3. Please obtain BMP at next PCP/pulsatility visit ( virtual orders placed future. 4. Patient structured to keep a log of her blood pressures to take to next PCP and cardiology visit  Accelerated hypertension Follow-up with cardiology,Hao Lisabeth Devoid, PA on 12/20/2019 at 9:15 AM..Started on carvedilol 25 mg p.o. twice daily, amlodipine 10 mg p.o. daily, hydralazine 25 mg p.o. every 8 hours, spironolactone 25 mg p.o. daily and losartan 50 mg p.o. daily for poorly controlled hypertension  Mixed hyperlipidemia Elevated lipid panel this  can lead to heart attack and stroke. Decrease your fatty foods, red meat, cheese, milk and increase fiber like whole grains and veggies.  Started on a statin in the hospital atorvastatin 40 mg at bedtime.  Reviewed hospital encounter,labs and imaging I provided 22  minutes of non-face-to-face time during this encounter.   John Sessions, NP

## 2020-02-04 ENCOUNTER — Other Ambulatory Visit (INDEPENDENT_AMBULATORY_CARE_PROVIDER_SITE_OTHER): Payer: Self-pay | Admitting: Primary Care

## 2020-02-04 NOTE — Telephone Encounter (Signed)
Notes to clinic:  Medication were prescribed in the hospital  Patient would like provider to refill    Requested Prescriptions  Pending Prescriptions Disp Refills   hydrALAZINE (APRESOLINE) 25 MG tablet 270 tablet 0    Sig: Take 1 tablet (25 mg total) by mouth every 8 (eight) hours.      Cardiovascular:  Vasodilators Failed - 02/04/2020  2:37 PM      Failed - Last BP in normal range    BP Readings from Last 1 Encounters:  12/15/19 (!) 182/109          Passed - HCT in normal range and within 360 days    HCT  Date Value Ref Range Status  12/12/2019 44.3 39 - 52 % Final   Hematocrit  Date Value Ref Range Status  06/23/2018 37.3 (L) 37.5 - 51.0 % Final          Passed - HGB in normal range and within 360 days    Hemoglobin  Date Value Ref Range Status  12/12/2019 13.9 13.0 - 17.0 g/dL Final  37/90/2409 73.5 13.0 - 17.7 g/dL Final          Passed - RBC in normal range and within 360 days    RBC  Date Value Ref Range Status  12/12/2019 4.76 4.22 - 5.81 MIL/uL Final          Passed - WBC in normal range and within 360 days    WBC  Date Value Ref Range Status  12/12/2019 8.0 4.0 - 10.5 K/uL Final          Passed - PLT in normal range and within 360 days    Platelets  Date Value Ref Range Status  12/12/2019 325 150 - 400 K/uL Final  06/23/2018 486 (H) 150 - 450 x10E3/uL Final          Passed - Valid encounter within last 12 months    Recent Outpatient Visits           1 month ago Hospital discharge follow-up   [facility] Grayce Sessions, NP       Future Appointments             In 3 weeks Randa Evens, Kinnie Scales, NP [facility]              spironolactone (ALDACTONE) 25 MG tablet 90 tablet 0    Sig: Take 1 tablet (25 mg total) by mouth daily.      Cardiovascular: Diuretics - Aldosterone Antagonist Failed - 02/04/2020  2:37 PM      Failed - Cr in normal range and within 360 days    Creatinine,  Ser  Date Value Ref Range Status  12/15/2019 1.35 (H) 0.61 - 1.24 mg/dL Final          Failed - K in normal range and within 360 days    Potassium  Date Value Ref Range Status  12/15/2019 3.4 (L) 3.5 - 5.1 mmol/L Final          Failed - Last BP in normal range    BP Readings from Last 1 Encounters:  12/15/19 (!) 182/109          Passed - Na in normal range and within 360 days    Sodium  Date Value Ref Range Status  12/15/2019 140 135 - 145 mmol/L Final  06/23/2018 140 134 - 144 mmol/L Final  Passed - Valid encounter within last 6 months    Recent Outpatient Visits           1 month ago Hospital discharge follow-up   [facility] Grayce Sessions, NP       Future Appointments             In 3 weeks Randa Evens, Kinnie Scales, NP [facility]              amLODipine (NORVASC) 10 MG tablet 90 tablet 0    Sig: Take 1 tablet (10 mg total) by mouth daily.      Cardiovascular:  Calcium Channel Blockers Failed - 02/04/2020  2:37 PM      Failed - Last BP in normal range    BP Readings from Last 1 Encounters:  12/15/19 (!) 182/109          Passed - Valid encounter within last 6 months    Recent Outpatient Visits           1 month ago Hospital discharge follow-up   [facility] Grayce Sessions, NP       Future Appointments             In 3 weeks Grayce Sessions, NP [facility]              atorvastatin (LIPITOR) 40 MG tablet 90 tablet 0    Sig: Take 1 tablet (40 mg total) by mouth daily.      Cardiovascular:  Antilipid - Statins Failed - 02/04/2020  2:37 PM      Failed - Total Cholesterol in normal range and within 360 days    Cholesterol  Date Value Ref Range Status  12/14/2019 251 (H) 0 - 200 mg/dL Final          Failed - LDL in normal range and within 360 days    LDL Cholesterol  Date Value Ref Range Status  12/14/2019 163 (H) 0 - 99  mg/dL Final    Comment:           Total Cholesterol/HDL:CHD Risk Coronary Heart Disease Risk Table                     Men   Women  1/2 Average Risk   3.4   3.3  Average Risk       5.0   4.4  2 X Average Risk   9.6   7.1  3 X Average Risk  23.4   11.0        Use the calculated Patient Ratio above and the CHD Risk Table to determine the patient's CHD Risk.        ATP III CLASSIFICATION (LDL):  <100     mg/dL   Optimal  818-563  mg/dL   Near or Above                    Optimal  130-159  mg/dL   Borderline  149-702  mg/dL   High  >637     mg/dL   Very High Performed at [facility], [address], [place], [place] [address]           Failed - HDL in normal range and within 360 days    HDL  Date Value Ref Range Status  12/14/2019 38 (L) >40 mg/dL Final  Failed - Triglycerides in normal range and within 360 days    Triglycerides  Date Value Ref Range Status  12/14/2019 251 (H) <150 mg/dL Final          Passed - Patient is not pregnant      Passed - Valid encounter within last 12 months    Recent Outpatient Visits           1 month ago Hospital discharge follow-up   [facility] Grayce Sessions, NP       Future Appointments             In 3 weeks Randa Evens, Kinnie Scales, NP [facility]              carvedilol (COREG) 25 MG tablet 180 tablet 0    Sig: Take 1 tablet (25 mg total) by mouth 2 (two) times daily with a meal.      Cardiovascular:  Beta Blockers Failed - 02/04/2020  2:37 PM      Failed - Last BP in normal range    BP Readings from Last 1 Encounters:  12/15/19 (!) 182/109          Passed - Last Heart Rate in normal range    Pulse Readings from Last 1 Encounters:  12/15/19 76          Passed - Valid encounter within last 6 months    Recent Outpatient Visits           1 month ago Hospital discharge follow-up   [facility] Grayce Sessions, NP        Future Appointments             In 3 weeks Grayce Sessions, NP [facility]

## 2020-02-04 NOTE — Telephone Encounter (Signed)
Pt is calling and the md from hospital prescribed hydralazine 25 mg, spironolactone 25 mg, atorvastatin 40 mg and carvedilol 25 mg and patient would like a refill . Pt also needs a refill on amlodipine prescribe by michelle. Pt has an appt with michelle on 02-28-2020. Pt would [facility]. Pt last seen michelle for hospital follow up sept 23

## 2020-02-11 MED ORDER — AMLODIPINE BESYLATE 10 MG PO TABS: 10.0000 mg | ORAL_TABLET | Freq: Every day | ORAL | 0 refills | Status: DC

## 2020-02-11 MED ORDER — HYDRALAZINE HCL 25 MG PO TABS: 25.0000 mg | ORAL_TABLET | Freq: Three times a day (TID) | ORAL | 0 refills | Status: DC

## 2020-02-11 MED ORDER — ATORVASTATIN CALCIUM 40 MG PO TABS: 40.0000 mg | ORAL_TABLET | Freq: Every day | ORAL | 0 refills | Status: DC

## 2020-02-11 MED ORDER — CARVEDILOL 25 MG PO TABS: 25.0000 mg | ORAL_TABLET | Freq: Two times a day (BID) | ORAL | 0 refills | Status: DC

## 2020-02-11 MED ORDER — SPIRONOLACTONE 25 MG PO TABS: 25.0000 mg | ORAL_TABLET | Freq: Every day | ORAL | 0 refills | Status: DC

## 2020-02-11 NOTE — Telephone Encounter (Signed)
Need f/u with cardiology

## 2020-02-28 ENCOUNTER — Ambulatory Visit (INDEPENDENT_AMBULATORY_CARE_PROVIDER_SITE_OTHER): Payer: Managed Care, Other (non HMO) | Admitting: Primary Care

## 2020-03-04 ENCOUNTER — Other Ambulatory Visit (INDEPENDENT_AMBULATORY_CARE_PROVIDER_SITE_OTHER): Payer: Self-pay | Admitting: Primary Care

## 2020-03-04 NOTE — Telephone Encounter (Signed)
Requested Prescriptions  Pending Prescriptions Disp Refills   hydrALAZINE (APRESOLINE) 25 MG tablet [Pharmacy Med Name: HYDRALAZINE 25 MG TABLET] 90 tablet 0    Sig: TAKE 1 TABLET BY MOUTH EVERY 8 HOURS.     Cardiovascular:  Vasodilators Failed - 03/04/2020 11:30 AM      Failed - Last BP in normal range    BP Readings from Last 1 Encounters:  12/15/19 (!) 182/109         Passed - HCT in normal range and within 360 days    HCT  Date Value Ref Range Status  12/12/2019 44.3 39 - 52 % Final   Hematocrit  Date Value Ref Range Status  06/23/2018 37.3 (L) 37.5 - 51.0 % Final         Passed - HGB in normal range and within 360 days    Hemoglobin  Date Value Ref Range Status  12/12/2019 13.9 13.0 - 17.0 g/dL Final  16/12/9602 54.0 13.0 - 17.7 g/dL Final         Passed - RBC in normal range and within 360 days    RBC  Date Value Ref Range Status  12/12/2019 4.76 4.22 - 5.81 MIL/uL Final         Passed - WBC in normal range and within 360 days    WBC  Date Value Ref Range Status  12/12/2019 8.0 4.0 - 10.5 K/uL Final         Passed - PLT in normal range and within 360 days    Platelets  Date Value Ref Range Status  12/12/2019 325 150 - 400 K/uL Final  06/23/2018 486 (H) 150 - 450 x10E3/uL Final         Passed - Valid encounter within last 12 months    Recent Outpatient Visits          2 months ago Hospital discharge follow-up   [facility] Grayce Sessions, NP      Future Appointments            In 4 days Grayce Sessions, NP [facility]

## 2020-03-08 ENCOUNTER — Encounter (INDEPENDENT_AMBULATORY_CARE_PROVIDER_SITE_OTHER): Payer: Self-pay | Admitting: Primary Care

## 2020-03-08 ENCOUNTER — Ambulatory Visit (INDEPENDENT_AMBULATORY_CARE_PROVIDER_SITE_OTHER): Payer: Managed Care, Other (non HMO) | Admitting: Primary Care

## 2020-03-08 ENCOUNTER — Other Ambulatory Visit: Payer: Self-pay

## 2020-03-08 VITALS — BP 134/82 | HR 83 | Temp 98.6°F | Resp 16 | Wt 176.4 lb

## 2020-03-08 DIAGNOSIS — E782 Mixed hyperlipidemia: Secondary | ICD-10-CM

## 2020-03-08 DIAGNOSIS — Z760 Encounter for issue of repeat prescription: Secondary | ICD-10-CM

## 2020-03-08 DIAGNOSIS — E876 Hypokalemia: Secondary | ICD-10-CM

## 2020-03-08 DIAGNOSIS — I10 Essential (primary) hypertension: Secondary | ICD-10-CM | POA: Diagnosis not present

## 2020-03-08 MED ORDER — AMLODIPINE BESYLATE 10 MG PO TABS: 10.0000 mg | ORAL_TABLET | Freq: Every day | ORAL | 1 refills | Status: DC

## 2020-03-08 MED ORDER — HYDRALAZINE HCL 25 MG PO TABS
25.0000 mg | ORAL_TABLET | Freq: Three times a day (TID) | ORAL | 1 refills | Status: DC
Start: 2020-03-08 — End: 2020-04-30

## 2020-03-08 MED ORDER — CARVEDILOL 25 MG PO TABS: 25.0000 mg | ORAL_TABLET | Freq: Two times a day (BID) | ORAL | 1 refills | Status: DC

## 2020-03-08 MED ORDER — ATORVASTATIN CALCIUM 40 MG PO TABS: 40.0000 mg | ORAL_TABLET | Freq: Every day | ORAL | 1 refills | Status: DC

## 2020-03-08 MED ORDER — SPIRONOLACTONE 25 MG PO TABS: 25.0000 mg | ORAL_TABLET | Freq: Every day | ORAL | 1 refills | Status: DC

## 2020-03-08 NOTE — Progress Notes (Signed)
Established Patient Office Visit  Subjective:  Patient ID: John Savage, male    DOB: May 20, 1980  Age: 39 y.o. MRN: [id number]  CC:  Chief Complaint  Patient presents with  . Hypertension    HPI [name] presents for the management of hypertension . Denies shortness of breath, headaches, chest pain or lower extremity edema  Past Medical History:  Diagnosis Date  . Hypertension     No past surgical history on file.  Family History  Problem Relation Age of Onset  . Healthy Mother   . Healthy Father     Social History   Socioeconomic History  . Marital status: Married    Spouse name: Not on file  . Number of children: Not on file  . Years of education: Not on file  . Highest education level: Not on file  Occupational History  . Not on file  Tobacco Use  . Smoking status: Never Smoker  . Smokeless tobacco: Never Used  Vaping Use  . Vaping Use: Never used  Substance and Sexual Activity  . Alcohol use: No  . Drug use: No  . Sexual activity: Not on file  Other Topics Concern  . Not on file  Social History Narrative   Right handed   Lives at home with wife and kids   Social Determinants of Health   Financial Resource Strain:   . Difficulty of Paying Living Expenses: Not on file  Food Insecurity:   . Worried About Charity fundraiser in the Last Year: Not on file  . Ran Out of Food in the Last Year: Not on file  Transportation Needs:   . Lack of Transportation (Medical): Not on file  . Lack of Transportation (Non-Medical): Not on file  Physical Activity:   . Days of Exercise per Week: Not on file  . Minutes of Exercise per Session: Not on file  Stress:   . Feeling of Stress : Not on file  Social Connections:   . Frequency of Communication with Friends and Family: Not on file  . Frequency of Social Gatherings with Friends and Family: Not on file  . Attends Religious Services: Not on file  . Active Member of Clubs or Organizations:  Not on file  . Attends Archivist Meetings: Not on file  . Marital Status: Not on file  Intimate Partner Violence:   . Fear of Current or Ex-Partner: Not on file  . Emotionally Abused: Not on file  . Physically Abused: Not on file  . Sexually Abused: Not on file    Outpatient Medications Prior to Visit  Medication Sig Dispense Refill  . ibuprofen (ADVIL) 200 MG tablet Take 400 mg by mouth every 6 (six) hours as needed (pain/headache).    Marland Kitchen amLODipine (NORVASC) 10 MG tablet Take 1 tablet (10 mg total) by mouth daily. 90 tablet 0  . atorvastatin (LIPITOR) 40 MG tablet Take 1 tablet (40 mg total) by mouth daily. 90 tablet 0  . carvedilol (COREG) 25 MG tablet Take 1 tablet (25 mg total) by mouth 2 (two) times daily with a meal. 180 tablet 0  . hydrALAZINE (APRESOLINE) 25 MG tablet TAKE 1 TABLET BY MOUTH EVERY 8 HOURS. 90 tablet 0  . spironolactone (ALDACTONE) 25 MG tablet Take 1 tablet (25 mg total) by mouth daily. 90 tablet 0   No facility-administered medications prior to visit.    Allergies  Allergen Reactions  . Aspirin Other (See Comments)    Unknown reaction  .  Novocain [Procaine] Other (See Comments)    Unknown reaction    ROS Review of Systems  All other systems reviewed and are negative.     Objective:    Physical Exam Vitals reviewed.  Constitutional:      Appearance: Normal appearance.  HENT:     Right Ear: Tympanic membrane normal.     Left Ear: Tympanic membrane normal.     Nose: Nose normal.  Eyes:     Extraocular Movements: Extraocular movements intact.     Pupils: Pupils are equal, round, and reactive to light.  Cardiovascular:     Rate and Rhythm: Normal rate and regular rhythm.  Pulmonary:     Effort: Pulmonary effort is normal.     Breath sounds: Normal breath sounds.  Abdominal:     General: Bowel sounds are normal. There is distension.     Palpations: Abdomen is soft.  Musculoskeletal:        General: Normal range of motion.      Cervical back: Normal range of motion.  Skin:    General: Skin is warm and dry.  Neurological:     Mental Status: He is alert and oriented to person, place, and time.  Psychiatric:        Mood and Affect: Mood normal.        Behavior: Behavior normal.        Thought Content: Thought content normal.        Judgment: Judgment normal.     BP 134/82   Pulse 83   Temp 98.6 F (37 C)   Resp 16   Wt 176 lb 6.4 oz (80 kg)   SpO2 100%   BMI 29.35 kg/m  Wt Readings from Last 3 Encounters:  03/08/20 176 lb 6.4 oz (80 kg)  12/11/19 186 lb (84.4 kg)  01/14/19 178 lb (80.7 kg)     Health Maintenance Due  Topic Date Due  . COVID-19 Vaccine (1) Never done  . TETANUS/TDAP  Never done    There are no preventive care reminders to display for this patient.  Lab Results  Component Value Date   TSH 0.775 06/23/2018   Lab Results  Component Value Date   WBC 8.0 12/12/2019   HGB 13.9 12/12/2019   HCT 44.3 12/12/2019   MCV 93.1 12/12/2019   PLT 325 12/12/2019   Lab Results  Component Value Date   NA 140 12/15/2019   K 3.4 (L) 12/15/2019   CO2 23 12/15/2019   GLUCOSE 131 (H) 12/15/2019   BUN 17 12/15/2019   CREATININE 1.35 (H) 12/15/2019   BILITOT 0.2 06/23/2018   ALKPHOS 98 06/23/2018   AST 33 06/23/2018   ALT 61 (H) 06/23/2018   PROT 7.4 06/23/2018   ALBUMIN 4.5 06/23/2018   CALCIUM 9.2 12/15/2019   ANIONGAP 11 12/15/2019   Lab Results  Component Value Date   CHOL 251 (H) 12/14/2019   Lab Results  Component Value Date   HDL 38 (L) 12/14/2019   Lab Results  Component Value Date   LDLCALC 163 (H) 12/14/2019   Lab Results  Component Value Date   TRIG 251 (H) 12/14/2019   Lab Results  Component Value Date   CHOLHDL 6.6 12/14/2019   Lab Results  Component Value Date   HGBA1C 5.1 12/12/2019      Assessment & Plan:   Abdel-Latif was seen today for hypertension.  Diagnoses and all orders for this visit:  Mixed hyperlipidemia  Decrease your fatty  foods,  red meat, cheese, milk and increase fiber like whole grains and veggies. You can also add a fiber supplement like Metamucil or Benefiber.  -     atorvastatin (LIPITOR) 40 MG tablet; Take 1 tablet (40 mg total) by mouth daily.  Hypokalemia -     CMP14+EGFR  Essential hypertension Patient is very close to  goal of less than 130/80, low-sodium, DASH diet, medication compliance, 150 minutes of moderate intensity exercise per week. Discussed medication compliance, adverse effects. Continue Hydralazine 25 Q 8 hrs, Spironolactone 77m Q daily , Carvedilol 25 twice daily, and Amlodipine 112mdaily    Other orders/Medication refill -     spironolactone (ALDACTONE) 25 MG tablet; Take 1 tablet (25 mg total) by mouth daily. -     carvedilol (COREG) 25 MG tablet; Take 1 tablet (25 mg total) by mouth 2 (two) times daily with a meal. -     amLODipine (NORVASC) 10 MG tablet; Take 1 tablet (10 mg total) by mouth daily. -     hydrALAZINE (APRESOLINE) 25 MG tablet; Take 1 tablet (25 mg total) by mouth every 8 (eight) hours.    Follow-up: Return if symptoms worsen or fail to improve, for follow up with cardiology as stated on hospital discharge.   MiKerin PernaNP

## 2020-03-09 LAB — CMP14+EGFR
ALT: 34 IU/L (ref 0–44)
AST: 15 IU/L (ref 0–40)
Albumin/Globulin Ratio: 1.5 (ref 1.2–2.2)
Albumin: 4.5 g/dL (ref 4.0–5.0)
Alkaline Phosphatase: 134 IU/L — ABNORMAL HIGH (ref 44–121)
BUN/Creatinine Ratio: 11 (ref 9–20)
BUN: 16 mg/dL (ref 6–20)
Bilirubin Total: 0.3 mg/dL (ref 0.0–1.2)
CO2: 23 mmol/L (ref 20–29)
Calcium: 10.2 mg/dL (ref 8.7–10.2)
Chloride: 101 mmol/L (ref 96–106)
Creatinine, Ser: 1.44 mg/dL — ABNORMAL HIGH (ref 0.76–1.27)
GFR calc Af Amer: 71 mL/min/{1.73_m2} (ref >59)
GFR calc non Af Amer: 61 mL/min/{1.73_m2} (ref >59)
Globulin, Total: 3.1 g/dL (ref 1.5–4.5)
Glucose: 150 mg/dL — ABNORMAL HIGH (ref 65–99)
Potassium: 4.4 mmol/L (ref 3.5–5.2)
Sodium: 140 mmol/L (ref 134–144)
Total Protein: 7.6 g/dL (ref 6.0–8.5)

## 2020-03-27 ENCOUNTER — Other Ambulatory Visit (INDEPENDENT_AMBULATORY_CARE_PROVIDER_SITE_OTHER): Payer: Self-pay | Admitting: Primary Care

## 2020-04-29 ENCOUNTER — Other Ambulatory Visit (INDEPENDENT_AMBULATORY_CARE_PROVIDER_SITE_OTHER): Payer: Self-pay | Admitting: Primary Care

## 2020-04-30 NOTE — Telephone Encounter (Signed)
Requested Prescriptions  Pending Prescriptions Disp Refills  . hydrALAZINE (APRESOLINE) 25 MG tablet [Pharmacy Med Name: HYDRALAZINE 25 MG TABLET] 90 tablet 2    Sig: TAKE 1 TABLET BY MOUTH EVERY 8 HOURS.     Cardiovascular:  Vasodilators Passed - 04/29/2020  1:31 PM      Passed - HCT in normal range and within 360 days    HCT  Date Value Ref Range Status  12/12/2019 44.3 39.0 - 52.0 % Final   Hematocrit  Date Value Ref Range Status  06/23/2018 37.3 (L) 37.5 - 51.0 % Final         Passed - HGB in normal range and within 360 days    Hemoglobin  Date Value Ref Range Status  12/12/2019 13.9 13.0 - 17.0 g/dL Final  74/10/1446 18.5 13.0 - 17.7 g/dL Final         Passed - RBC in normal range and within 360 days    RBC  Date Value Ref Range Status  12/12/2019 4.76 4.22 - 5.81 MIL/uL Final         Passed - WBC in normal range and within 360 days    WBC  Date Value Ref Range Status  12/12/2019 8.0 4.0 - 10.5 K/uL Final         Passed - PLT in normal range and within 360 days    Platelets  Date Value Ref Range Status  12/12/2019 325 150 - 400 K/uL Final  06/23/2018 486 (H) 150 - 450 x10E3/uL Final         Passed - Last BP in normal range    BP Readings from Last 1 Encounters:  03/08/20 134/82         Passed - Valid encounter within last 12 months    Recent Outpatient Visits          1 month ago Mixed hyperlipidemia   [facility] Grayce Sessions, NP   4 months ago Hospital discharge follow-up   [facility] Grayce Sessions, NP      Future Appointments            In 1 month Allyson Sabal, Delton See, MD [facility]

## 2020-05-24 ENCOUNTER — Ambulatory Visit: Payer: Managed Care, Other (non HMO) | Admitting: Cardiovascular Disease

## 2020-06-27 ENCOUNTER — Other Ambulatory Visit: Payer: Self-pay

## 2020-06-27 ENCOUNTER — Encounter: Payer: Self-pay | Admitting: Cardiovascular Disease

## 2020-06-27 ENCOUNTER — Ambulatory Visit (INDEPENDENT_AMBULATORY_CARE_PROVIDER_SITE_OTHER): Payer: 59 | Admitting: Cardiovascular Disease

## 2020-06-27 DIAGNOSIS — E782 Mixed hyperlipidemia: Secondary | ICD-10-CM | POA: Diagnosis not present

## 2020-06-27 DIAGNOSIS — E785 Hyperlipidemia, unspecified: Secondary | ICD-10-CM | POA: Insufficient documentation

## 2020-06-27 DIAGNOSIS — I10 Essential (primary) hypertension: Secondary | ICD-10-CM

## 2020-06-27 MED ORDER — AMLODIPINE BESYLATE 10 MG PO TABS: 10.0000 mg | ORAL_TABLET | Freq: Every day | ORAL | 3 refills | Status: DC

## 2020-06-27 MED ORDER — HYDRALAZINE HCL 25 MG PO TABS: 25.0000 mg | ORAL_TABLET | Freq: Three times a day (TID) | ORAL | 3 refills | Status: DC

## 2020-06-27 MED ORDER — ATORVASTATIN CALCIUM 40 MG PO TABS: 40.0000 mg | ORAL_TABLET | Freq: Every day | ORAL | 3 refills | Status: DC

## 2020-06-27 MED ORDER — SPIRONOLACTONE 25 MG PO TABS: 25.0000 mg | ORAL_TABLET | Freq: Every day | ORAL | 3 refills | Status: DC

## 2020-06-27 MED ORDER — CARVEDILOL 25 MG PO TABS: 25.0000 mg | ORAL_TABLET | Freq: Two times a day (BID) | ORAL | 3 refills | Status: DC

## 2020-06-27 NOTE — Progress Notes (Signed)
06/27/2020 [name]   October 14, 1980  [id number]  Primary Physician Grayce Sessions, NP Primary Cardiologist: Runell Gess MD Roseanne Reno  HPI:  Darnel Mchan is a 40 y.o.  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 He was referred by his PCP, Joaquin Courts, FNP, for evaluation of hypertension and abnormal EKG. I last saw him in the office 01/14/2019.Marland KitchenHe is originally from [place]. Has been in the [place]. His only risk factor is hypertension. There is no family history for heart disease. Is never had a heart attack or stroke. He denies chest pain or shortness of breath. He was recently in the hospital in March for upper respiratory tract infection. He was hypertensive at that time. His medications were adjusted. He is currently on lisinopril and hydrochlorothiazide. He is unable to check his blood pressure at home because he does not have a cuff. His EKG showed sinus rhythm with LVH and repolarization changes.  He has been on on amlodipine, low-dose carvedilol, hydrochlorothiazide and lisinopril with an excellent blood pressure.  A 2D echocardiogram performed 12/14/2018 showed mild left ventricular hypertrophy which is somewhat asymmetric and normal LV function.  He denies chest pain or shortness of breath.  He does avoid salt.  He was admitted to the hospital for hypertensive urgency 12/11/2019 for 4 days.  He does check his blood pressure at home   Current Meds  Medication Sig  . hydrALAZINE (APRESOLINE) 25 MG tablet TAKE 1 TABLET BY MOUTH EVERY 8 HOURS.  Marland Kitchen ibuprofen (ADVIL) 200 MG tablet Take 400 mg by mouth every 6 (six) hours as needed (pain/headache).     Allergies  Allergen Reactions  . Aspirin Other (See Comments)    Unknown reaction  . Novocain [Procaine] Other (See Comments)    Unknown reaction    Social History   Socioeconomic History  . Marital status: Married     Spouse name: Not on file  . Number of children: Not on file  . Years of education: Not on file  . Highest education level: Not on file  Occupational History  . Not on file  Tobacco Use  . Smoking status: Never Smoker  . Smokeless tobacco: Never Used  Vaping Use  . Vaping Use: Never used  Substance and Sexual Activity  . Alcohol use: No  . Drug use: No  . Sexual activity: Not on file  Other Topics Concern  . Not on file  Social History Narrative   Right handed   Lives at home with wife and kids   Social Determinants of Health   Financial Resource Strain: Not on file  Food Insecurity: Not on file  Transportation Needs: Not on file  Physical Activity: Not on file  Stress: Not on file  Social Connections: Not on file  Intimate Partner Violence: Not on file     Review of Systems: General: negative for chills, fever, night sweats or weight changes.  Cardiovascular: negative for chest pain, dyspnea on exertion, edema, orthopnea, palpitations, paroxysmal nocturnal dyspnea or shortness of breath Dermatological: negative for rash Respiratory: negative for cough or wheezing Urologic: negative for hematuria Abdominal: negative for nausea, vomiting, diarrhea, bright red blood per rectum, melena, or hematemesis Neurologic: negative for visual changes, syncope, or dizziness All other systems reviewed and are otherwise negative except as noted above.    Blood pressure (!) 160/110, pulse 72, height 5\' 5"  (1.651 m), weight 181 lb (82.1 kg), SpO2  97 %.  General appearance: alert and no distress Neck: no adenopathy, no carotid bruit, no JVD, supple, symmetrical, trachea midline and thyroid not enlarged, symmetric, no tenderness/mass/nodules Lungs: clear to auscultation bilaterally Heart: regular rate and rhythm, S1, S2 normal, no murmur, click, rub or gallop Extremities: extremities normal, atraumatic, no cyanosis or edema Pulses: 2+ and symmetric Skin: Skin color, texture, turgor  normal. No rashes or lesions Neurologic: Alert and oriented X 3, normal strength and tone. Normal symmetric reflexes. Normal coordination and gait  EKG not performed today  ASSESSMENT AND PLAN:   Severe hypertension History of difficult to control hypertension blood pressure measured today 160/110 although at home he says it is significantly lower than this.  He is on hydralazine 3 times daily, amlodipine, carvedilol and and spironolactone.  He did have renal Doppler studies that ruled out renal artery stenosis.  He has moderate renal insufficiency.  I am referring him to Dr. Thomasene Lot hypertension clinic for further evaluation and treatment.  Hyperlipidemia History of hyperlipidemia on atorvastatin with lipid profile performed 12/14/2019 revealing total cholesterol 251, LDL of 163 and HDL 38.  We will recheck a lipid liver profile.      Runell Gess MD FACP,FACC,FAHA, [facility] 06/27/2020 1:52 PM

## 2020-06-27 NOTE — Assessment & Plan Note (Signed)
History of difficult to control hypertension blood pressure measured today 160/110 although at home he says it is significantly lower than this.  He is on hydralazine 3 times daily, amlodipine, carvedilol and and spironolactone.  He did have renal Doppler studies that ruled out renal artery stenosis.  He has moderate renal insufficiency.  I am referring him to Dr. Thomasene Lot hypertension clinic for further evaluation and treatment.

## 2020-06-27 NOTE — Patient Instructions (Signed)
Medication Instructions:  Your physician recommends that you continue on your current medications as directed. Please refer to the Current Medication list given to you today.  *If you need a refill on your cardiac medications before your next appointment, please call your pharmacy*   Lab Work: Your physician recommends that you return for lab work in: next 1-2 weeks for fast lipid/liver profile.   If you have labs (blood work) drawn today and your tests are completely normal, you will receive your results only by: Marland Kitchen MyChart Message (if you have MyChart) OR . A paper copy in the mail If you have any lab test that is abnormal or we need to change your treatment, we will call you to review the results.   Follow-Up: At [facility], you and your health needs are our priority.  As part of our continuing mission to provide you with exceptional heart care, we have created designated Provider Care Teams.  These Care Teams include your primary Cardiologist (physician) and Advanced Practice Providers (APPs -  Physician Assistants and Nurse Practitioners) who all work together to provide you with the care you need, when you need it.  We recommend signing up for the patient portal called "MyChart".  Sign up information is provided on this After Visit Summary.  MyChart is used to connect with patients for Virtual Visits (Telemedicine).  Patients are able to view lab/test results, encounter notes, upcoming appointments, etc.  Non-urgent messages can be sent to your provider as well.   To learn more about what you can do with MyChart, go to [email or web address].    Your next appointment:   6 month(s)  The format for your next appointment:   In Person  Provider:   You will see one of the following Advanced Practice Providers on your designated Care Team:    Marjie Skiff, PA-C  Edd Fabian, FNP  Then, Nanetta Batty, MD will plan to see you again in 12 month(s).    Other  Instructions Referral made to hypertension clinic with Dr. Duke Salvia.

## 2020-06-27 NOTE — Assessment & Plan Note (Signed)
History of hyperlipidemia on atorvastatin with lipid profile performed 12/14/2019 revealing total cholesterol 251, LDL of 163 and HDL 38.  We will recheck a lipid liver profile.

## 2020-08-01 LAB — HEPATIC FUNCTION PANEL
ALT: 49 IU/L — ABNORMAL HIGH (ref 0–44)
AST: 17 IU/L (ref 0–40)
Albumin: 4.8 g/dL (ref 4.0–5.0)
Alkaline Phosphatase: 98 IU/L (ref 44–121)
Bilirubin Total: 0.5 mg/dL (ref 0.0–1.2)
Bilirubin, Direct: 0.12 mg/dL (ref 0.00–0.40)
Total Protein: 7.3 g/dL (ref 6.0–8.5)

## 2020-08-01 LAB — LIPID PANEL
Chol/HDL Ratio: 3.3 ratio (ref 0.0–5.0)
Cholesterol, Total: 115 mg/dL (ref 100–199)
HDL: 35 mg/dL — ABNORMAL LOW (ref >39)
LDL Chol Calc (NIH): 61 mg/dL (ref 0–99)
Triglycerides: 102 mg/dL (ref 0–149)
VLDL Cholesterol Cal: 19 mg/dL (ref 5–40)

## 2020-12-29 ENCOUNTER — Encounter: Payer: Self-pay | Admitting: Cardiovascular Disease

## 2020-12-29 ENCOUNTER — Other Ambulatory Visit: Payer: Self-pay

## 2020-12-29 ENCOUNTER — Ambulatory Visit (INDEPENDENT_AMBULATORY_CARE_PROVIDER_SITE_OTHER): Payer: 59 | Admitting: Cardiovascular Disease

## 2020-12-29 VITALS — BP 166/112 | HR 70 | Ht 65.0 in | Wt 175.2 lb

## 2020-12-29 DIAGNOSIS — E782 Mixed hyperlipidemia: Secondary | ICD-10-CM

## 2020-12-29 DIAGNOSIS — I517 Cardiomegaly: Secondary | ICD-10-CM | POA: Diagnosis not present

## 2020-12-29 DIAGNOSIS — I10 Essential (primary) hypertension: Secondary | ICD-10-CM | POA: Diagnosis not present

## 2020-12-29 NOTE — Assessment & Plan Note (Signed)
History of hyperlipidemia on statin therapy with lipid profile performed 08/01/2020 revealing total cholesterol of 115, LDL 61 HDL 35.

## 2020-12-29 NOTE — Assessment & Plan Note (Signed)
History of labile hypertension a blood pressure measured today at 166/112.  He does check his blood pressure at home and its nearly fluctuating.  He is on amlodipine, carvedilol and hydralazine.  He is not taking Aldactone.  He does have LVH on 2D echocardiogram I am going to have him keep a blood pressure log for the next 30 days and see a Pharm.D. for review when medicine titration.

## 2020-12-29 NOTE — Progress Notes (Signed)
12/29/2020 John Savage   11-Oct-1980  [id number]  Primary Physician Grayce Sessions, NP Primary Cardiologist: Runell Gess MD Roseanne Reno  HPI:  John Savage is a 40 y.o.  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  He was referred by his PCP, John Courts, FNP, for evaluation of hypertension and abnormal EKG. I last saw him in the office 06/27/2020.Marland Kitchen He is originally from [place].  Has [place] the [place].  His only risk factor is hypertension.  There is no family history for heart disease.  Is never had a heart attack or stroke.  He denies chest pain or shortness of breath.  He was recently in the hospital in March for upper respiratory tract infection.  He was hypertensive at that time.  His medications were adjusted.  He is currently on lisinopril and hydrochlorothiazide.  He is unable to check his blood pressure at home because he does not have a cuff.  His EKG showed sinus rhythm with LVH and repolarization changes.   He has been on on amlodipine, low-dose carvedilol, hydrochlorothiazide and lisinopril with an excellent blood pressure.  A 2D echocardiogram performed 12/14/2018 showed mild left ventricular hypertrophy which is somewhat asymmetric and normal LV function.  He denies chest pain or shortness of breath.  He does avoid salt.   He was admitted to the hospital for hypertensive urgency 12/11/2019 for 4 days.  He does check his blood pressure at home.  Since I saw him 6 months ago he continues to do well.  He is taking his medications as prescribed.  He does check his blood pressure at home.  He denies chest pain or shortness of breath.   Current Meds  Medication Sig   amLODipine (NORVASC) 10 MG tablet Take 1 tablet (10 mg total) by mouth daily.   atorvastatin (LIPITOR) 40 MG tablet Take 1 tablet (40 mg total) by mouth daily.   carvedilol (COREG) 25 MG tablet Take 1 tablet (25 mg total) by  mouth 2 (two) times daily with a meal.   hydrALAZINE (APRESOLINE) 25 MG tablet Take 1 tablet (25 mg total) by mouth every 8 (eight) hours.   ibuprofen (ADVIL) 200 MG tablet Take 400 mg by mouth every 6 (six) hours as needed (pain/headache).     Allergies  Allergen Reactions   Aspirin Other (See Comments)    Unknown reaction   Novocain [Procaine] Other (See Comments)    Unknown reaction    Social History   Socioeconomic History   Marital status: Married    Spouse name: Not on file   Number of children: Not on file   Years of education: Not on file   Highest education level: Not on file  Occupational History   Not on file  Tobacco Use   Smoking status: Never   Smokeless tobacco: Never  Vaping Use   Vaping Use: Never used  Substance and Sexual Activity   Alcohol use: No   Drug use: No   Sexual activity: Not on file  Other Topics Concern   Not on file  Social History Narrative   Right handed   Lives at home with wife and kids   Social Determinants of Health   Financial Resource Strain: Not on file  Food Insecurity: Not on file  Transportation Needs: Not on file  Physical Activity: Not on file  Stress: Not on file  Social Connections: Not on file  Intimate Partner Violence: Not on file     Review of Systems: General: negative for chills, fever, night sweats or weight changes.  Cardiovascular: negative for chest pain, dyspnea on exertion, edema, orthopnea, palpitations, paroxysmal nocturnal dyspnea or shortness of breath Dermatological: negative for rash Respiratory: negative for cough or wheezing Urologic: negative for hematuria Abdominal: negative for nausea, vomiting, diarrhea, bright red blood per rectum, melena, or hematemesis Neurologic: negative for visual changes, syncope, or dizziness All other systems reviewed and are otherwise negative except as noted above.    Blood pressure (!) 166/112, pulse 70, height 5\' 5"  (1.651 m), weight 175 lb 3.2 oz (79.5  kg), SpO2 98 %.  General appearance: alert and no distress Neck: no adenopathy, no carotid bruit, no JVD, supple, symmetrical, trachea midline, and thyroid not enlarged, symmetric, no tenderness/mass/nodules Lungs: clear to auscultation bilaterally Heart: regular rate and rhythm, S1, S2 normal, no murmur, click, rub or gallop Extremities: extremities normal, atraumatic, no cyanosis or edema Pulses: 2+ and symmetric Skin: Skin color, texture, turgor normal. No rashes or lesions Neurologic: Grossly normal  EKG sinus rhythm at 70 with evidence of LVH with repolarization changes.  I personally reviewed this EKG  ASSESSMENT AND PLAN:   Severe hypertension History of labile hypertension a blood pressure measured today at 166/112.  He does check his blood pressure at home and its nearly fluctuating.  He is on amlodipine, carvedilol and hydralazine.  He is not taking Aldactone.  He does have LVH on 2D echocardiogram I am going to have him keep a blood pressure log for the next 30 days and see a Pharm.D. for review when medicine titration.  Hyperlipidemia History of hyperlipidemia on statin therapy with lipid profile performed 08/01/2020 revealing total cholesterol of 115, LDL 61 HDL 35.     10/01/2020 MD Kahi Mohala, [facility] 12/29/2020 11:52 AM

## 2020-12-29 NOTE — Patient Instructions (Signed)
Medication Instructions:  Your physician recommends that you continue on your current medications as directed. Please refer to the Current Medication list given to you today.  *If you need a refill on your cardiac medications before your next appointment, please call your pharmacy*   Follow-Up: At [facility], you and your health needs are our priority.  As part of our continuing mission to provide you with exceptional heart care, we have created designated Provider Care Teams.  These Care Teams include your primary Cardiologist (physician) and Advanced Practice Providers (APPs -  Physician Assistants and Nurse Practitioners) who all work together to provide you with the care you need, when you need it.  We recommend signing up for the patient portal called "MyChart".  Sign up information is provided on this After Visit Summary.  MyChart is used to connect with patients for Virtual Visits (Telemedicine).  Patients are able to view lab/test results, encounter notes, upcoming appointments, etc.  Non-urgent messages can be sent to your provider as well.   To learn more about what you can do with MyChart, go to [email or web address].    Your next appointment:   12 month(s)  The format for your next appointment:   In Person  Provider:   Nanetta Batty, MD   Other Instructions Dr. Allyson Sabal has requested that you schedule an appointment with one of our clinical pharmacists for a blood pressure check appointment within the next 4 weeks.  If you monitor your blood pressure (BP) at home, please bring your BP cuff and your BP readings with you to this appointment  HOW TO TAKE YOUR BLOOD PRESSURE: Rest 5 minutes before taking your blood pressure. Don't smoke or drink caffeinated beverages for at least 30 minutes before. Take your blood pressure before (not after) you eat. Sit comfortably with your back supported and both feet on the floor (don't cross your legs). Elevate your arm to heart level  on a table or a desk. Use the proper sized cuff. It should fit smoothly and snugly around your bare upper arm. There should be enough room to slip a fingertip under the cuff. The bottom edge of the cuff should be 1 inch above the crease of the elbow.

## 2021-01-19 ENCOUNTER — Other Ambulatory Visit: Payer: Self-pay

## 2021-01-19 ENCOUNTER — Ambulatory Visit: Payer: 59 | Admitting: Pharmacist

## 2021-01-19 VITALS — BP 145/91 | HR 81 | Wt 176.4 lb

## 2021-01-19 DIAGNOSIS — I10 Essential (primary) hypertension: Secondary | ICD-10-CM

## 2021-01-19 DIAGNOSIS — E782 Mixed hyperlipidemia: Secondary | ICD-10-CM | POA: Diagnosis not present

## 2021-01-19 MED ORDER — HYDRALAZINE HCL 25 MG PO TABS
25.0000 mg | ORAL_TABLET | Freq: Three times a day (TID) | ORAL | 3 refills | Status: DC
Start: 2021-01-19 — End: 2021-03-09

## 2021-01-19 MED ORDER — AMLODIPINE BESYLATE 10 MG PO TABS: 10.0000 mg | ORAL_TABLET | Freq: Every day | ORAL | 3 refills | Status: DC

## 2021-01-19 MED ORDER — ATORVASTATIN CALCIUM 40 MG PO TABS: 40.0000 mg | ORAL_TABLET | Freq: Every day | ORAL | 3 refills | Status: DC

## 2021-01-19 NOTE — Patient Instructions (Addendum)
It was nice meeting you today!  We would like your blood pressure to be less than 130/80  Continue:  amlodipine 10mg  daily Carvedilol 25mg  twice a day Hydralazine 25mg  three times a day (every 8 hours)  Start taking your blood pressure at home.  We recommend blood pressure monitors made by [employer].  We recommend the Series Bronze or higher or the Series 3 or higher  Try to start using less salt in your cooking.  We recommend Mrs products  Try to cut down on sugar as well  Increase your exercise to 30 minutes a day at least 5 days a week  Please call with any questions  , PharmD, BCACP, CDCES, CPP [address] [place], [place], [address] Phone: [phone], Fax: [phone]

## 2021-01-19 NOTE — Progress Notes (Signed)
Patient ID: John Savage                 DOB: 1981/03/12                      MRN: [id number]     HPI: John Savage is a 40 y.o. male referred by Dr. Allyson Sabal to HTN clinic. PMH is significant for HTN and HLD.  Patient admitted to [facility] one year ago for hypertensive urgency.  Patient presents today in good spirits.  Brought his blood pressure cuff because he believes he may not be operating it correctly.  Had patient demonstrate technique and received error.  Tried to take BP on myself and also received error. Replaced batteries and still received errors.  Unknown what home BP readings are.  Patient confused regarding medications.  During medication reconciliation patient confused regarding which medications he is supposed to be taking.  Had not been picking up refills. Unclear if he knew he was supposed to.  Had not been taking hydralazine three times a day.  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 Works four days a week and then when gets home has to take care of his three children.  Wife does the cooking at home and cooks traditional [place] foods such as corn and yams.  Does use salt.  Does not use tobacco or drink alcohol.  Current HTN meds:  amlodipine 10mg  daily Carvedilol 25mg  BID Hydralazine 25mg  TID (pt not been taking TID)  Previously tried: spironolactone (did not pick up refill) BP goal: <130/80   Wt Readings from Last 3 Encounters:  12/29/20 175 lb 3.2 oz (79.5 kg)  06/27/20 181 lb (82.1 kg)  03/08/20 176 lb 6.4 oz (80 kg)   BP Readings from Last 3 Encounters:  12/29/20 (!) 166/112  06/27/20 (!) 160/110  03/08/20 134/82   Pulse Readings from Last 3 Encounters:  12/29/20 70  06/27/20 72  03/08/20 83    Renal function: CrCl cannot be calculated (Patient's most recent lab result is older than the maximum 21 days allowed.).  Past Medical History:  Diagnosis Date   Hypertension     Current Outpatient Medications on File  Prior to Visit  Medication Sig Dispense Refill   amLODipine (NORVASC) 10 MG tablet Take 1 tablet (10 mg total) by mouth daily. 90 tablet 3   atorvastatin (LIPITOR) 40 MG tablet Take 1 tablet (40 mg total) by mouth daily. 90 tablet 3   carvedilol (COREG) 25 MG tablet Take 1 tablet (25 mg total) by mouth 2 (two) times daily with a meal. 180 tablet 3   hydrALAZINE (APRESOLINE) 25 MG tablet Take 1 tablet (25 mg total) by mouth every 8 (eight) hours. 270 tablet 3   ibuprofen (ADVIL) 200 MG tablet Take 400 mg by mouth every 6 (six) hours as needed (pain/headache).     spironolactone (ALDACTONE) 25 MG tablet Take 1 tablet (25 mg total) by mouth daily. (Patient not taking: Reported on 12/29/2020) 90 tablet 3   No current facility-administered medications on file prior to visit.    Allergies  Allergen Reactions   Aspirin Other (See Comments)    Unknown reaction   Novocain [Procaine] Other (See Comments)    Unknown reaction     Assessment/Plan:  1. Hypertension -  Patient BP in room 145/91 which is above goal of <130/80. Patient elevated BP likely a combination of confusion regaridng regimen and physcial inactivity.  Refilled patients  hydralazine, carvedilol and amlodipine.  Explained that hydralazine was dosed every 8 hours.  Patient had not refilled spironolactone, however Scr 1.44 in 2021 so will not refill at this time and will wait until it is rechecked.  Encouraged patient to be more physically active.  Recommended increasing exercise to at least 30 minutes a day at least 5 days a week. Recommended no salt seasoning such as Mrs Sharilyn Sites to use while cooking.  Unclear what patient's home readings are due to machine not functioning properly.  Gave list of [employer] cuffs recommended.  Encouraged home monitoring. Patient voiced understanding.  Continue: Amlodpine 10mg  daily Carvedilol 25mg  BID Hydralazine 25mg  TID Recheck in 4 weeks  , PharmD, BCACP, CDCES, CPP [address]  [address] [place], [place], [address] Phone: [phone], Fax: [phone]

## 2021-02-16 ENCOUNTER — Ambulatory Visit: Payer: 59

## 2021-03-09 ENCOUNTER — Ambulatory Visit (INDEPENDENT_AMBULATORY_CARE_PROVIDER_SITE_OTHER): Payer: 59 | Admitting: Pharmacist Clinician (PhC)/ Clinical Pharmacy Specialist

## 2021-03-09 ENCOUNTER — Other Ambulatory Visit: Payer: Self-pay

## 2021-03-09 VITALS — BP 148/100 | HR 82

## 2021-03-09 DIAGNOSIS — I10 Essential (primary) hypertension: Secondary | ICD-10-CM

## 2021-03-09 LAB — BASIC METABOLIC PANEL
BUN/Creatinine Ratio: 9 (ref 9–20)
BUN: 11 mg/dL (ref 6–20)
CO2: 24 mmol/L (ref 20–29)
Calcium: 9.5 mg/dL (ref 8.7–10.2)
Chloride: 102 mmol/L (ref 96–106)
Creatinine, Ser: 1.19 mg/dL (ref 0.76–1.27)
Glucose: 259 mg/dL — ABNORMAL HIGH (ref 70–99)
Potassium: 3.9 mmol/L (ref 3.5–5.2)
Sodium: 139 mmol/L (ref 134–144)
eGFR: 80 mL/min/{1.73_m2} (ref >59)

## 2021-03-09 MED ORDER — HYDRALAZINE HCL 50 MG PO TABS: 50.0000 mg | ORAL_TABLET | Freq: Two times a day (BID) | ORAL | 3 refills | Status: DC

## 2021-03-09 NOTE — Patient Instructions (Signed)
Return for a a follow up appointment Friday February 10 at 9 am  Go to the lab today to check kidney function  Check your blood pressure at home once or twice daily and keep record of the readings.  Take your BP meds as follows:  AM: amlodipine 10 mg, carvedilol 25 mg, hydralazine 50 mg (take 2 of the 25 mg tabs until gone), atorvastatin  PM: carvedilol 25 mg, hydralazine 40 mg,   Bring all of your meds, your BP cuff and your record of home blood pressures to your next appointment.  Exercise as you're able, try to walk approximately 30 minutes per day.  Keep salt intake to a minimum, especially watch canned and prepared boxed foods.  Eat more fresh fruits and vegetables and fewer canned items.  Avoid eating in fast food restaurants.    HOW TO TAKE YOUR BLOOD PRESSURE: Rest 5 minutes before taking your blood pressure.  Don't smoke or drink caffeinated beverages for at least 30 minutes before. Take your blood pressure before (not after) you eat. Sit comfortably with your back supported and both feet on the floor (don't cross your legs). Elevate your arm to heart level on a table or a desk. Use the proper sized cuff. It should fit smoothly and snugly around your bare upper arm. There should be enough room to slip a fingertip under the cuff. The bottom edge of the cuff should be 1 inch above the crease of the elbow. Ideally, take 3 measurements at one sitting and record the average.

## 2021-03-09 NOTE — Progress Notes (Signed)
03/13/2021 [name] Jan 26, 1981 [id number]   HPI:  John Savage is a 40 y.o. male patient of Dr Allyson Sabal, who presents today for hypertension clinic evaluation.  In addition to hypertension, his medical history is significant for hyperlipidemia and diabetes.   He was seen by [place] PharmD in October, at which time it was determined his home BP cuff was inaccurate and he had confusion in regards to his medications.  He had not been picking up refills, was not taking meds as directed, and was unclear on what to take.  His BP that day was 145/91.  He was given instructions on which medications to take, and asked to return in a month for follow up.   Today he returns to the office.  States he has done much better with taking his meds, although admits the mid-day hydralazine is still forgotten a few times each week.  No complaints of side effects and no home BP readings.  He did just recently purchase a new [facility] home BP monitor, but has not opened it yet.     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  Tested and shown to be accurate within 5 points.    Intolerances: aspirin, novocaine  Labs: (today) Na 139, K 3.9, Glu 259, BUN 11, SCr 1.19 GFR 80   Wt Readings from Last 3 Encounters:  01/19/21 176 lb 6.4 oz (80 kg)  12/29/20 175 lb 3.2 oz (79.5 kg)  06/27/20 181 lb (82.1 kg)   BP Readings from Last 3 Encounters:  03/09/21 (!) 148/100  01/19/21 (!) 145/91  12/29/20 (!) 166/112   Pulse Readings from Last 3 Encounters:  03/09/21 82  01/19/21 81  12/29/20 70     Current Outpatient Medications  Medication Sig Dispense Refill   hydrALAZINE (APRESOLINE) 50 MG tablet Take 1 tablet (50 mg total) by mouth in the morning and at bedtime. 180 tablet 3   amLODipine (NORVASC) 10 MG tablet Take 1 tablet (10 mg total) by mouth daily. 90 tablet 3   atorvastatin (LIPITOR) 40 MG tablet Take 1 tablet (40 mg total) by mouth daily. 90 tablet 3   carvedilol (COREG) 25 MG tablet Take 1 tablet (25 mg total) by mouth 2 (two) times daily with a meal. 180 tablet 3   ibuprofen (ADVIL) 200 MG tablet Take 400 mg by mouth every 6 (six) hours as needed (pain/headache).     No current facility-administered medications for this visit.    Allergies  Allergen Reactions   Aspirin Other (See Comments)    Unknown reaction   Novocain [Procaine] Other (See Comments)    Unknown reaction    Past Medical History:  Diagnosis Date   Hypertension     Blood pressure (!) 148/100, pulse 82.  Severe hypertension Patient with essential hypertension, not well controlled on combination of amlodipine, carvedilol and hydralazine.  Labs drawn today at office visit.  Will increase hydralazine dose to 50 mg and change to just twice daily for better compliance.  Now that he has a home cuff, we should be  able to get a better picture of daily BP fluctuations.  Based on the labs drawn today he would benefit from ACEI/ARB, both because of improved kidney function as well as elevated glucose.  Will consider adding at his next appointment.     Phillips Hay PharmD CPP [facility] [address] [place], [place] [address] [phone]

## 2021-03-13 ENCOUNTER — Encounter: Payer: Self-pay | Admitting: Pharmacist Clinician (PhC)/ Clinical Pharmacy Specialist

## 2021-03-13 NOTE — Assessment & Plan Note (Signed)
Patient with essential hypertension, not well controlled on combination of amlodipine, carvedilol and hydralazine.  Labs drawn today at office visit.  Will increase hydralazine dose to 50 mg and change to just twice daily for better compliance.  Now that he has a home cuff, we should be able to get a better picture of daily BP fluctuations.  Based on the labs drawn today he would benefit from ACEI/ARB, both because of improved kidney function as well as elevated glucose.  Will consider adding at his next appointment.

## 2021-05-11 ENCOUNTER — Ambulatory Visit: Payer: 59 | Admitting: Pharmacist

## 2021-05-11 ENCOUNTER — Other Ambulatory Visit: Payer: Self-pay

## 2021-05-11 ENCOUNTER — Encounter: Payer: Self-pay | Admitting: Pharmacist

## 2021-05-11 VITALS — BP 145/89 | HR 91 | Wt 178.0 lb

## 2021-05-11 DIAGNOSIS — I10 Essential (primary) hypertension: Secondary | ICD-10-CM

## 2021-05-11 MED ORDER — OLMESARTAN MEDOXOMIL 20 MG PO TABS: 20.0000 mg | ORAL_TABLET | Freq: Every day | ORAL | 2 refills | Status: DC

## 2021-05-11 MED ORDER — AMLODIPINE BESYLATE 10 MG PO TABS: 10.0000 mg | ORAL_TABLET | Freq: Every day | ORAL | 3 refills | Status: DC

## 2021-05-11 NOTE — Progress Notes (Signed)
Patient ID: John Savage                 DOB: 1981-02-27                      MRN: [id number]     HPI: John Savage is a 41 y.o. male referred by Dr. Allyson Sabal to HTN clinic. PMH is significant for resistant HTN and T2DM.  Patient seen two months ago and endorsed confusion regarding medications and refills.  Restarted medication and was rechecked last month and endorsed better compliance.   Patient presents today in good spirits. Reports medication compliance now.  Reports he has been taking his blood pressure at home but did not bring cuff or log.  When asked what his readings are he reports 130-164. Does not remember any diastolic readings.  Works in Clinical biochemist for [employer] . Reports his job is very stressful because he is often yelled at over the phone.  Has to take time off from work as a break.  Reports no adverse effects to any medications. However reports sometimes he has to take a nap during the day and when he wakes up he feels better.  Recently enrolled in [employer] and plans to start exercising on the weekends.    Last lab work showed renal function improving however blood glucose was elevated.  Does not check blood sugar at home.  Most recent A1c from 2021.  Current HTN meds:  Hydralazine 50mg  BID Amlodipine 10mg  daily Carvediol 25mg  BID Previously tried:  BP goal: <130/80  Wt Readings from Last 3 Encounters:  01/19/21 176 lb 6.4 oz (80 kg)  12/29/20 175 lb 3.2 oz (79.5 kg)  06/27/20 181 lb (82.1 kg)   BP Readings from Last 3 Encounters:  03/09/21 (!) 148/100  01/19/21 (!) 145/91  12/29/20 (!) 166/112   Pulse Readings from Last 3 Encounters:  03/09/21 82  01/19/21 81  12/29/20 70    Renal function: CrCl cannot be calculated (Patient's most recent lab result is older than the maximum 21 days allowed.).  Past Medical History:  Diagnosis Date   Hypertension     Current Outpatient Medications on File Prior to Visit  Medication Sig Dispense  Refill   amLODipine (NORVASC) 10 MG tablet Take 1 tablet (10 mg total) by mouth daily. 90 tablet 3   atorvastatin (LIPITOR) 40 MG tablet Take 1 tablet (40 mg total) by mouth daily. 90 tablet 3   carvedilol (COREG) 25 MG tablet Take 1 tablet (25 mg total) by mouth 2 (two) times daily with a meal. 180 tablet 3   hydrALAZINE (APRESOLINE) 50 MG tablet Take 1 tablet (50 mg total) by mouth in the morning and at bedtime. 180 tablet 3   ibuprofen (ADVIL) 200 MG tablet Take 400 mg by mouth every 6 (six) hours as needed (pain/headache).     No current facility-administered medications on file prior to visit.    Allergies  Allergen Reactions   Aspirin Other (See Comments)    Unknown reaction   Novocain [Procaine] Other (See Comments)    Unknown reaction     Assessment/Plan:  1. Hypertension -  Patient BP in room 145/89 which is above goal of <130/80 but has improved now that patient is being compliant with medications.  Due to elevated blood sugar, would benefit from ACE/ARB.  Will start olmesartam 20mg  ocne daily and recheck BMP in 2 weeks.  Since patient is reporting daytime fatigue will check A1c.  Recheck in  clinic in 4 weeks.  Continue: Hydralazine 50mg  twice a day Carvedilol 25mg  twice a day Amlodipine 10mg  once a day Start: Olmesartan 20mg  once daily  Check A1c and BMP in 2 weeks Recheck in clinic in 4 weeks  , PharmD, BCACP, CDCES, CPP [address], [address] [place], , [place] Phone: [phone], Fax: [phone]

## 2021-05-11 NOTE — Patient Instructions (Addendum)
It was nice seeing you again  We would like your blood pressure to be less than 130/80  Continue your:  Hydralazine 50mg  twice a day Carvedilol 25mg  twice a day Amlodipine 10mg  once a day  We will start a new medication called olmesartan 20mg . Take one tablet once a day  I would like to update your lab work in about 2 weeks.  You can go to any [facility]  We will see you again in 1 month  Please call with any questions!  Karren Cobble, PharmD, BCACP, [place], [place], [place], [place], [address] Phone: [phone], Fax: [phone]

## 2021-06-05 ENCOUNTER — Encounter: Payer: Self-pay | Admitting: Pharmacist

## 2021-06-05 ENCOUNTER — Other Ambulatory Visit: Payer: Self-pay

## 2021-06-05 ENCOUNTER — Ambulatory Visit: Payer: 59 | Admitting: Pharmacist

## 2021-06-05 VITALS — BP 142/80 | HR 91 | Resp 17 | Ht 65.0 in | Wt 177.4 lb

## 2021-06-05 DIAGNOSIS — I10 Essential (primary) hypertension: Secondary | ICD-10-CM

## 2021-06-05 LAB — BASIC METABOLIC PANEL
BUN/Creatinine Ratio: 10 (ref 9–20)
BUN: 12 mg/dL (ref 6–20)
CO2: 23 mmol/L (ref 20–29)
Calcium: 9.3 mg/dL (ref 8.7–10.2)
Chloride: 104 mmol/L (ref 96–106)
Creatinine, Ser: 1.24 mg/dL (ref 0.76–1.27)
Glucose: 222 mg/dL — ABNORMAL HIGH (ref 70–99)
Potassium: 3.9 mmol/L (ref 3.5–5.2)
Sodium: 141 mmol/L (ref 134–144)
eGFR: 76 mL/min/{1.73_m2} (ref >59)

## 2021-06-05 LAB — HEMOGLOBIN A1C
Est. average glucose Bld gHb Est-mCnc: 131 mg/dL
Hgb A1c MFr Bld: 6.2 % — ABNORMAL HIGH (ref 4.8–5.6)

## 2021-06-05 MED ORDER — CARVEDILOL 25 MG PO TABS: 25.0000 mg | ORAL_TABLET | Freq: Two times a day (BID) | ORAL | 1 refills | Status: DC

## 2021-06-05 NOTE — Progress Notes (Signed)
Patient ID: John Savage                 DOB: 09-Jan-1981                      MRN: [id number] ? ? ? ? ?HPI: ?John Savage is a 41 y.o. male referred by Dr. Gwenlyn Found  to HTN clinic. PMH is significant forfor resistant HTN and T2DM.  Patient seen two months ago and endorsed confusion regarding medications and refills.  Restarted medication and was rechecked last month and endorsed better compliance.  ? ?Patient presents today in good spirits. Still works high stress job in [employer] center. Is still planning on traveling to [place] in [place] however passport is not ready yet. His mother is ill. ? ?Has started to try to check his BP more regularly and believes systolic readings are typically between 120-145.  Checks about twice per week. Believes the addition of olmesartan has helped. ? ?Was instructed to check BMP and A1c after starting olmesartan but had not completed. ? ?Reports wife continues to cook meals at home and uses a heavy amount of salt. ? ? ?Current HTN meds ? ?Hydralazine 50mg  twice a day ?Carvedilol 25mg  twice a day ?Amlodipine 10mg  once a day ?Olmesartan 20mg  daily ? ?BP goal: <130/80 ? ? ?Wt Readings from Last 3 Encounters:  ?05/11/21 178 lb (80.7 kg)  ?01/19/21 176 lb 6.4 oz (80 kg)  ?12/29/20 175 lb 3.2 oz (79.5 kg)  ? ?BP Readings from Last 3 Encounters:  ?05/11/21 (!) 145/89  ?03/09/21 (!) 148/100  ?01/19/21 (!) 145/91  ? ?Pulse Readings from Last 3 Encounters:  ?05/11/21 91  ?03/09/21 82  ?01/19/21 81  ? ? ?Renal function: ?CrCl cannot be calculated (Patient's most recent lab result is older than the maximum 21 days allowed.). ? ?Past Medical History:  ?Diagnosis Date  ? Hypertension   ? ? ?Current Outpatient Medications on File Prior to Visit  ?Medication Sig Dispense Refill  ? amLODipine (NORVASC) 10 MG tablet Take 1 tablet (10 mg total) by mouth daily. 90 tablet 3  ? atorvastatin (LIPITOR) 40 MG tablet Take 1 tablet (40 mg total) by mouth daily. 90 tablet 3  ? carvedilol (COREG) 25  MG tablet Take 1 tablet (25 mg total) by mouth 2 (two) times daily with a meal. 180 tablet 3  ? hydrALAZINE (APRESOLINE) 50 MG tablet Take 1 tablet (50 mg total) by mouth in the morning and at bedtime. 180 tablet 3  ? ibuprofen (ADVIL) 200 MG tablet Take 400 mg by mouth every 6 (six) hours as needed (pain/headache).    ? olmesartan (BENICAR) 20 MG tablet Take 1 tablet (20 mg total) by mouth daily. 30 tablet 2  ? ?No current facility-administered medications on file prior to visit.  ? ? ?Allergies  ?Allergen Reactions  ? Aspirin Other (See Comments)  ?  Unknown reaction  ? Novocain [Procaine] Other (See Comments)  ?  Unknown reaction  ? ? ? ?Assessment/Plan: ? ?1. Hypertension -  Patient BP in room today 142/80 which is above goal of <130/80 but is improving from previous visits.  Patient would likely benefit from higher dose of olmesartan but ill await results of lab work to assess renal function and electroytes,  Patient voiced understanding. ? ?Continue: ?Hydralazine 50mg  twice a day ?Carvedilol 25mg  twice a day ?Amlodipine 10mg  once a day ?Olmesartan 20mg  daily ?Check CMP/A1c ?Recheck in 1 month ? ?Karren Cobble, PharmD, BCACP, [place], CPP ?[address]  [place], [address] ?[place], [place], [address] ?Phone: [phone], Fax: [phone]  ?

## 2021-06-05 NOTE — Patient Instructions (Addendum)
It was good seeing you again ? ?We would like your blood pressure to be less than 130/80 ? ?Please continue your : ? ?Hydralazine 50mg  twice a day ?Carvedilol 25mg  twice a day ?Amlodipine 10mg  once a day ?Olmesartan 20mg  daily ? ?Continue to check your blood pressure at home and try to watch your salt ? ?We will check your labs today and I will contact you with your results ? ? , PharmD, BCACP, CDCES, CPP ?[address] ?[place], , [place] ?Phone: [phone], Fax: [phone]  ?

## 2021-06-20 ENCOUNTER — Encounter (HOSPITAL_COMMUNITY): Payer: Self-pay | Admitting: *Deleted

## 2021-06-20 ENCOUNTER — Ambulatory Visit (HOSPITAL_COMMUNITY)
Admission: EM | Admit: 2021-06-20 | Discharge: 2021-06-20 | Disposition: A | Discharge status: Home / Self Care | Payer: 59 | Attending: Family Medicine | Admitting: Family Medicine

## 2021-06-20 DIAGNOSIS — J020 Streptococcal pharyngitis: Secondary | ICD-10-CM

## 2021-06-20 LAB — POCT RAPID STREP A, ED / UC: Streptococcus, Group A Screen (Direct): POSITIVE — AB

## 2021-06-20 MED ORDER — PREDNISONE 20 MG PO TABS: 20.0000 mg | ORAL_TABLET | Freq: Every day | ORAL | 0 refills | Status: AC

## 2021-06-20 MED ORDER — AMOXICILLIN 875 MG PO TABS: 875.0000 mg | ORAL_TABLET | Freq: Two times a day (BID) | ORAL | 0 refills | Status: DC

## 2021-06-20 NOTE — ED Triage Notes (Signed)
C/O sore throat and feeling feverish onset today. Denies runny nose or congestion. ?

## 2021-06-20 NOTE — ED Provider Notes (Signed)
?[facility] ? ? ? ?CSN: [id number] ?Arrival date & time: 06/20/21  1957 ? ? ?  ? ?History   ?Chief Complaint ?Chief Complaint  ?Patient presents with  ? Sore Throat  ? ? ?HPI ?John Savage is a 41 y.o. male.  ? ?HPI ?Patient presents today for evaluation and sore throat and fever x 1 day. ?Unknown sick exposure. Patient endorses associated headache and generalized fever. BP is chronically elevated patient is followed by cardiology for management of his blood pressure.  Patient denies any other associated symptoms. ? ?Past Medical History:  ?Diagnosis Date  ? Hypertension   ? ? ?Patient Active Problem List  ? Diagnosis Date Noted  ? Hyperlipidemia 06/27/2020  ? Hypertensive emergency 12/11/2019  ? Left ventricular hypertrophy by electrocardiogram 07/28/2018  ? Acute upper respiratory infection 06/07/2018  ? Headache 06/07/2018  ? Abnormal brain MRI 06/07/2018  ? Severe hypertension 06/06/2018  ? ? ?History reviewed. No pertinent surgical history. ? ? ? ? ?Home Medications   ? ?Prior to Admission medications   ?Medication Sig Start Date End Date Taking? Authorizing Provider  ?amLODipine (NORVASC) 10 MG tablet Take 1 tablet (10 mg total) by mouth daily. 05/11/21 08/09/21 Yes Runell Gess, MD  ?atorvastatin (LIPITOR) 40 MG tablet Take 1 tablet (40 mg total) by mouth daily. 01/19/21 09/09/22 Yes Runell Gess, MD  ?carvedilol (COREG) 25 MG tablet Take 1 tablet (25 mg total) by mouth 2 (two) times daily with a meal. 06/05/21 09/03/21 Yes Runell Gess, MD  ?hydrALAZINE (APRESOLINE) 50 MG tablet Take 1 tablet (50 mg total) by mouth in the morning and at bedtime. 03/09/21 06/20/21 Yes Runell Gess, MD  ?olmesartan (BENICAR) 20 MG tablet Take 1 tablet (20 mg total) by mouth daily. 05/11/21  Yes Runell Gess, MD  ?acetaminophen (TYLENOL) 325 MG tablet Take 650 mg by mouth every 6 (six) hours as needed.    [provider]  ?ibuprofen (ADVIL) 200 MG tablet Take 400 mg by mouth every 6  (six) hours as needed (pain/headache).    [provider]  ? ? ?Family History ?Family History  ?Problem Relation Age of Onset  ? Healthy Mother   ? Healthy Father   ? ? ?Social History ?Social History  ? ?Tobacco Use  ? Smoking status: Never  ? Smokeless tobacco: Never  ?Vaping Use  ? Vaping Use: Never used  ?Substance Use Topics  ? Alcohol use: No  ? Drug use: No  ? ? ? ?Allergies   ?Aspirin and Novocain [procaine] ? ? ?Review of Systems ?Review of Systems ?Pertinent negatives listed in HPI  ?Physical Exam ?Triage Vital Signs ?ED Triage Vitals  ?Enc Vitals Group  ?   BP 06/20/21 2006 (!) 184/112  ?   Pulse Rate 06/20/21 2006 92  ?   Resp 06/20/21 2006 18  ?   Temp 06/20/21 2006 98.9 ?F (37.2 ?C)  ?   Temp Source 06/20/21 2006 Oral  ?   SpO2 06/20/21 2006 95 %  ?   Weight --   ?   Height --   ?   Head Circumference --   ?   Peak Flow --   ?   Pain Score 06/20/21 2008 8  ?   Pain Loc --   ?   Pain Edu? --   ?   Excl. in GC? --   ? ?No data found. ? ?Updated Vital Signs ?BP (!) 184/112   Pulse 92   Temp  98.9 ?F (37.2 ?C) (Oral)   Resp 18   SpO2 95%  ? ?Visual Acuity ?Right Eye Distance:   ?Left Eye Distance:   ?Bilateral Distance:   ? ?Right Eye Near:   ?Left Eye Near:    ?Bilateral Near:    ? ?Physical Exam ?Constitutional:   ?   Appearance: He is ill-appearing.  ?HENT:  ?   Head: Normocephalic and atraumatic.  ?   Mouth/Throat:  ?   Pharynx: Pharyngeal swelling, oropharyngeal exudate, posterior oropharyngeal erythema and uvula swelling present.  ?   Tonsils: Tonsillar exudate present. 3+ on the right. 3+ on the left.  ?Eyes:  ?   Conjunctiva/sclera: Conjunctivae normal.  ?   Pupils: Pupils are equal, round, and reactive to light.  ?Cardiovascular:  ?   Rate and Rhythm: Normal rate and regular rhythm.  ?Pulmonary:  ?   Effort: Pulmonary effort is normal.  ?   Breath sounds: Normal breath sounds.  ?Musculoskeletal:  ?   Cervical back: Normal range of motion.  ?Lymphadenopathy:  ?   Cervical: Cervical  adenopathy present.  ?Skin: ?   General: Skin is warm.  ?   Capillary Refill: Capillary refill takes less than 2 seconds.  ?Neurological:  ?   Mental Status: He is alert.  ?  ?UC Treatments / Results  ?Labs ?(all labs ordered are listed, but only abnormal results are displayed) ?Labs Reviewed  ?POCT RAPID STREP A, ED / UC - Abnormal; Notable for the following components:  ?    Result Value  ? Streptococcus, Group A Screen (Direct) POSITIVE (*)   ? All other components within normal limits  ? ? ?EKG ? ? ?Radiology ?No results found. ? ?Procedures ?Procedures (including critical care time) ? ?Medications Ordered in UC ?Medications - No data to display ? ?Initial Impression / Assessment and Plan / UC Course  ?I have reviewed the triage vital signs and the nursing notes. ? ?Pertinent labs & imaging results that were available during my care of the patient were reviewed by me and considered in my medical decision making (see chart for details). ? ?  ?Strep infection, confirmed positive rapid strep test.  ?Treatment with amoxicillin 875 twice daily for 10 days.  Given patient's swelling of the throat I am going to place him on a short course of prednisone 20 mg once daily for 3 days to decrease tonsillar swelling.  Force fluids.  Alternate ibuprofen and Tylenol as needed for throat pain and swelling. ?Final Clinical Impressions(s) / UC Diagnoses  ? ?Final diagnoses:  ?Streptococcal sore throat  ? ?Discharge Instructions   ?None ?  ? ?ED Prescriptions   ? ? Medication Sig Dispense Auth. Provider  ? amoxicillin (AMOXIL) 875 MG tablet Take 1 tablet (875 mg total) by mouth 2 (two) times daily. 20 tablet Bing Neighbors, FNP  ? predniSONE (DELTASONE) 20 MG tablet Take 1 tablet (20 mg total) by mouth daily with breakfast for 3 days. 3 tablet Bing Neighbors, FNP  ? ?  ? ?PDMP not reviewed this encounter. ?  ?Bing Neighbors, FNP ?06/25/21 1748 ? ?

## 2021-07-06 ENCOUNTER — Other Ambulatory Visit: Payer: Self-pay | Admitting: Pharmacist

## 2021-07-06 DIAGNOSIS — E782 Mixed hyperlipidemia: Secondary | ICD-10-CM

## 2021-07-06 DIAGNOSIS — I10 Essential (primary) hypertension: Secondary | ICD-10-CM

## 2021-07-06 MED ORDER — CARVEDILOL 25 MG PO TABS: 25.0000 mg | ORAL_TABLET | Freq: Two times a day (BID) | ORAL | 1 refills | Status: DC

## 2021-07-06 MED ORDER — OLMESARTAN MEDOXOMIL 20 MG PO TABS: 20.0000 mg | ORAL_TABLET | Freq: Every day | ORAL | 1 refills | Status: DC

## 2021-07-06 MED ORDER — ATORVASTATIN CALCIUM 40 MG PO TABS: 40.0000 mg | ORAL_TABLET | Freq: Every day | ORAL | 1 refills | Status: DC

## 2021-08-17 ENCOUNTER — Ambulatory Visit: Payer: 59 | Admitting: Cardiovascular Disease

## 2021-08-24 ENCOUNTER — Ambulatory Visit: Payer: 59

## 2021-08-24 ENCOUNTER — Ambulatory Visit: Payer: 59 | Admitting: Cardiovascular Disease

## 2021-08-24 NOTE — Progress Notes (Incomplete)
Patient ID: John Savage                 DOB: 1980-06-27                      MRN: [id number]     HPI: John Savage is a 41 y.o. male referred by Dr. Allyson Sabal  to HTN clinic. PMH is significant forfor resistant HTN and T2DM.  Patient seen two months ago and endorsed confusion regarding medications and refills.  Restarted medication and was rechecked last month and endorsed better compliance.   Patient presents today in good spirits. Still works high stress job in [employer] center. Is still planning on traveling to [place] in [place] however passport is not ready yet. His mother is ill.  Has started to try to check his BP more regularly and believes systolic readings are typically between 120-145.  Checks about twice per week. Believes the addition of olmesartan has helped.  Was instructed to check BMP and A1c after starting olmesartan but had not completed.  Reports wife continues to cook meals at home and uses a heavy amount of salt.   Current HTN meds  Hydralazine 50mg  twice a day Carvedilol 25mg  twice a day Amlodipine 10mg  once a day Olmesartan 20mg  daily  BP goal: <130/80   Wt Readings from Last 3 Encounters:  06/05/21 177 lb 6.4 oz (80.5 kg)  05/11/21 178 lb (80.7 kg)  01/19/21 176 lb 6.4 oz (80 kg)   BP Readings from Last 3 Encounters:  06/20/21 (!) 184/112  06/05/21 (!) 142/80  05/11/21 (!) 145/89   Pulse Readings from Last 3 Encounters:  06/20/21 92  06/05/21 91  05/11/21 91    Renal function: CrCl cannot be calculated (Patient's most recent lab result is older than the maximum 21 days allowed.).  Past Medical History:  Diagnosis Date   Hypertension     Current Outpatient Medications on File Prior to Visit  Medication Sig Dispense Refill   acetaminophen (TYLENOL) 325 MG tablet Take 650 mg by mouth every 6 (six) hours as needed.     amLODipine (NORVASC) 10 MG tablet Take 1 tablet (10 mg total) by mouth daily. 90 tablet 3   amoxicillin (AMOXIL)  875 MG tablet Take 1 tablet (875 mg total) by mouth 2 (two) times daily. 20 tablet 0   atorvastatin (LIPITOR) 40 MG tablet Take 1 tablet (40 mg total) by mouth daily. 90 tablet 1   carvedilol (COREG) 25 MG tablet Take 1 tablet (25 mg total) by mouth 2 (two) times daily with a meal. 180 tablet 1   hydrALAZINE (APRESOLINE) 50 MG tablet Take 1 tablet (50 mg total) by mouth in the morning and at bedtime. 180 tablet 3   ibuprofen (ADVIL) 200 MG tablet Take 400 mg by mouth every 6 (six) hours as needed (pain/headache).     olmesartan (BENICAR) 20 MG tablet Take 1 tablet (20 mg total) by mouth daily. 90 tablet 1   No current facility-administered medications on file prior to visit.    Allergies  Allergen Reactions   Aspirin Other (See Comments)    Unknown reaction   Novocain [Procaine] Other (See Comments)    Unknown reaction     Assessment/Plan:  1. Hypertension -  Patient BP in room today 142/80 which is above goal of <130/80 but is improving from previous visits.  Patient would likely benefit from higher dose of olmesartan but ill await results of lab work to assess renal  function and electroytes,  Patient voiced understanding.  Continue: Hydralazine 50mg  twice a day Carvedilol 25mg  twice a day Amlodipine 10mg  once a day Olmesartan 20mg  daily Check CMP/A1c Recheck in 1 month  , PharmD, BCACP, CDCES, CPP [address] [place], , [place] Phone: [phone], Fax: [phone]

## 2021-10-09 ENCOUNTER — Encounter (HOSPITAL_COMMUNITY): Payer: Self-pay

## 2021-10-09 ENCOUNTER — Ambulatory Visit (HOSPITAL_COMMUNITY)
Admission: EM | Admit: 2021-10-09 | Discharge: 2021-10-09 | Disposition: A | Discharge status: Home / Self Care | Payer: 59 | Attending: Physician Assistant | Admitting: Physician Assistant

## 2021-10-09 DIAGNOSIS — H109 Unspecified conjunctivitis: Secondary | ICD-10-CM | POA: Diagnosis not present

## 2021-10-09 DIAGNOSIS — B9689 Other specified bacterial agents as the cause of diseases classified elsewhere: Secondary | ICD-10-CM

## 2021-10-09 MED ORDER — TOBRAMYCIN 0.3 % OP SOLN: 2.0000 [drp] | Freq: Four times a day (QID) | OPHTHALMIC | 0 refills | Status: DC

## 2021-10-09 NOTE — ED Triage Notes (Signed)
Pt reports eye irritation x 2 weeks. He reports eye are matted when he wakes in the morning.

## 2021-10-09 NOTE — Discharge Instructions (Signed)
Use eye drops as directed Recommend daily allergy medication like Zyrtec Return if symptoms do not improve

## 2021-10-12 NOTE — ED Provider Notes (Signed)
[facility]    CSN: [id number] Arrival date & time: 10/09/21  1956      History   Chief Complaint Chief Complaint  Patient presents with   Conjunctivitis    HPI John Savage is a 41 y.o. male.   Pt complains of intermittent bilateral eye redness over the last two weeks that became worse over the last few days with drainage, eyes matted shut when upon waking.  He denies contact or use of glasses.  He has taken nothing for the sx.  Denies cough, congestion, fever, chills.  Both of his son's have similar sx.     Past Medical History:  Diagnosis Date   Hypertension     Patient Active Problem List   Diagnosis Date Noted   Hyperlipidemia 06/27/2020   Hypertensive emergency 12/11/2019   Left ventricular hypertrophy by electrocardiogram 07/28/2018   Acute upper respiratory infection 06/07/2018   Headache 06/07/2018   Abnormal brain MRI 06/07/2018   Severe hypertension 06/06/2018    History reviewed. No pertinent surgical history.     Home Medications    Prior to Admission medications   Medication Sig Start Date End Date Taking? Authorizing Provider  tobramycin (TOBREX) 0.3 % ophthalmic solution Place 2 drops into both eyes every 6 (six) hours. 10/09/21  Yes Ward, Tylene Fantasia, PA-C  acetaminophen (TYLENOL) 325 MG tablet Take 650 mg by mouth every 6 (six) hours as needed.    [provider]  amLODipine (NORVASC) 10 MG tablet Take 1 tablet (10 mg total) by mouth daily. 05/11/21 08/09/21  Runell Gess, MD  amoxicillin (AMOXIL) 875 MG tablet Take 1 tablet (875 mg total) by mouth 2 (two) times daily. 06/20/21   Bing Neighbors, FNP  atorvastatin (LIPITOR) 40 MG tablet Take 1 tablet (40 mg total) by mouth daily. 07/06/21 10/04/21  Runell Gess, MD  carvedilol (COREG) 25 MG tablet Take 1 tablet (25 mg total) by mouth 2 (two) times daily with a meal. 07/06/21 10/04/21  Runell Gess, MD  hydrALAZINE (APRESOLINE) 50 MG tablet Take 1 tablet (50 mg  total) by mouth in the morning and at bedtime. 03/09/21 06/20/21  Runell Gess, MD  ibuprofen (ADVIL) 200 MG tablet Take 400 mg by mouth every 6 (six) hours as needed (pain/headache).    [provider]  olmesartan (BENICAR) 20 MG tablet Take 1 tablet (20 mg total) by mouth daily. 07/06/21   Runell Gess, MD    Family History Family History  Problem Relation Age of Onset   Healthy Mother    Healthy Father     Social History Social History   Tobacco Use   Smoking status: Never   Smokeless tobacco: Never  Vaping Use   Vaping Use: Never used  Substance Use Topics   Alcohol use: No   Drug use: No     Allergies   Aspirin and Novocain [procaine]   Review of Systems Review of Systems  Constitutional:  Negative for chills and fever.  HENT:  Negative for ear pain and sore throat.   Eyes:  Positive for discharge and redness. Negative for visual disturbance.  Respiratory:  Negative for cough and shortness of breath.   Cardiovascular:  Negative for chest pain and palpitations.  Gastrointestinal:  Negative for abdominal pain and vomiting.  Genitourinary:  Negative for dysuria and hematuria.  Musculoskeletal:  Negative for arthralgias and back pain.  Skin:  Negative for color change and rash.  Neurological:  Negative for seizures and  syncope.  All other systems reviewed and are negative.    Physical Exam Triage Vital Signs ED Triage Vitals [10/09/21 2012]  Enc Vitals Group     BP (!) 152/95     Pulse Rate (!) 106     Resp 18     Temp (!) 97.1 F (36.2 C)     Temp Source Oral     SpO2 98 %     Weight      Height      Head Circumference      Peak Flow      Pain Score      Pain Loc      Pain Edu?      Excl. in GC?    No data found.  Updated Vital Signs BP (!) 152/95 (BP Location: Left Arm)   Pulse (!) 106   Temp (!) 97.1 F (36.2 C) (Oral)   Resp 18   SpO2 98%   Visual Acuity Right Eye Distance:   Left Eye Distance:   Bilateral Distance:     Right Eye Near:   Left Eye Near:    Bilateral Near:     Physical Exam Vitals and nursing note reviewed.  Constitutional:      General: He is not in acute distress.    Appearance: He is well-developed.  HENT:     Head: Normocephalic and atraumatic.  Eyes:     Conjunctiva/sclera: Conjunctivae normal.     Comments: Bilateral conjunctivitis, no drainage noted.   Cardiovascular:     Rate and Rhythm: Normal rate and regular rhythm.     Heart sounds: No murmur heard. Pulmonary:     Effort: Pulmonary effort is normal. No respiratory distress.     Breath sounds: Normal breath sounds.  Abdominal:     Palpations: Abdomen is soft.     Tenderness: There is no abdominal tenderness.  Musculoskeletal:        General: No swelling.     Cervical back: Neck supple.  Skin:    General: Skin is warm and dry.     Capillary Refill: Capillary refill takes less than 2 seconds.  Neurological:     Mental Status: He is alert.  Psychiatric:        Mood and Affect: Mood normal.      UC Treatments / Results  Labs (all labs ordered are listed, but only abnormal results are displayed) Labs Reviewed - No data to display  EKG   Radiology No results found.  Procedures Procedures (including critical care time)  Medications Ordered in UC Medications - No data to display  Initial Impression / Assessment and Plan / UC Course  I have reviewed the triage vital signs and the nursing notes.  Pertinent labs & imaging results that were available during my care of the patient were reviewed by me and considered in my medical decision making (see chart for details).     Will treat for bacterial conjunctivitis.  Advised supportive care.  Return precautions discussed.  Final Clinical Impressions(s) / UC Diagnoses   Final diagnoses:  Bacterial conjunctivitis of both eyes     Discharge Instructions      Use eye drops as directed Recommend daily allergy medication like Zyrtec Return if symptoms  do not improve   ED Prescriptions     Medication Sig Dispense Auth. Provider   tobramycin (TOBREX) 0.3 % ophthalmic solution Place 2 drops into both eyes every 6 (six) hours. 5 mL Ward, Tylene Fantasia, PA-C  PDMP not reviewed this encounter.   Ward, Tylene Fantasia, PA-C 10/12/21 1631

## 2021-11-20 IMAGING — CT CT HEAD W/O CM
4 series · 16 of 47 positions shown, 18 images · non-contrast
Comparison: CT head and MRI brain 06/06/2018.

CLINICAL DATA: 39-year-old presenting with acute hypertension and 2
day history of headache.

EXAM:
CT HEAD WITHOUT CONTRAST
TECHNIQUE: Contiguous axial images were obtained from the base of the skull
through the vertex without intravenous contrast.

[Series 3: head wo · axial · 0.39mm/px · z∈[-132,-27]mm · 7 of 29 slices shown, 9 images]
[im 4/29  brain]
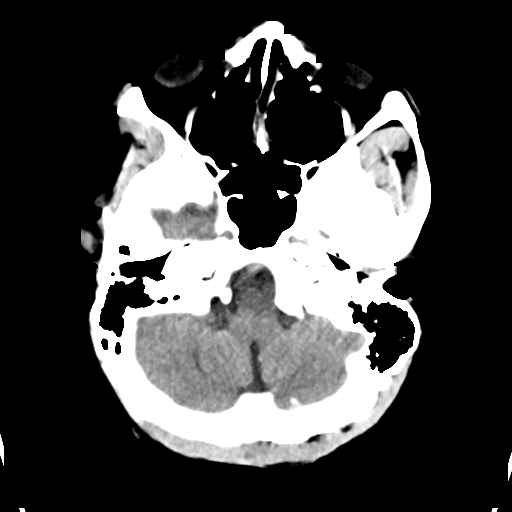
[im 4/29  bone]
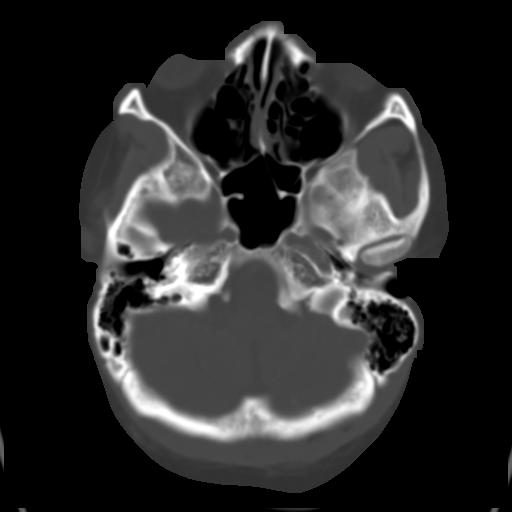
[im 8/29  brain]
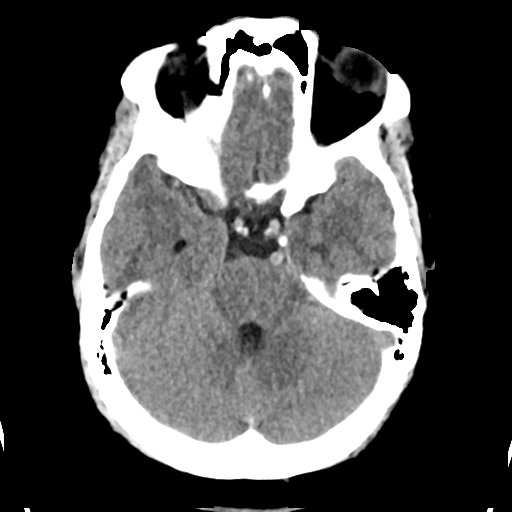
[im 11/29  brain]
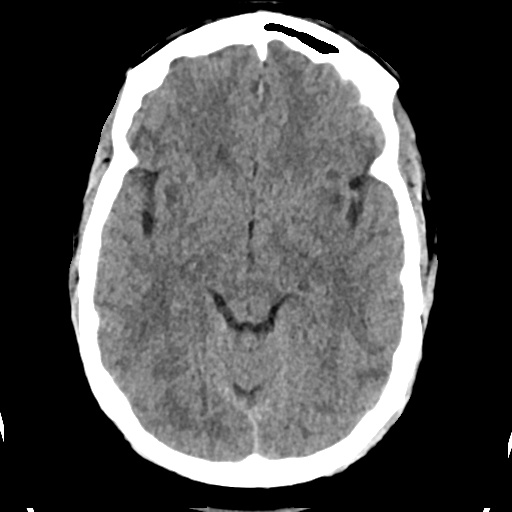
[im 15/29  brain]
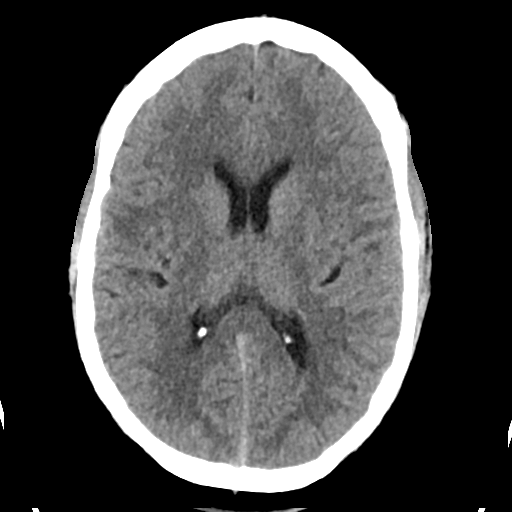
[im 18/29  brain]
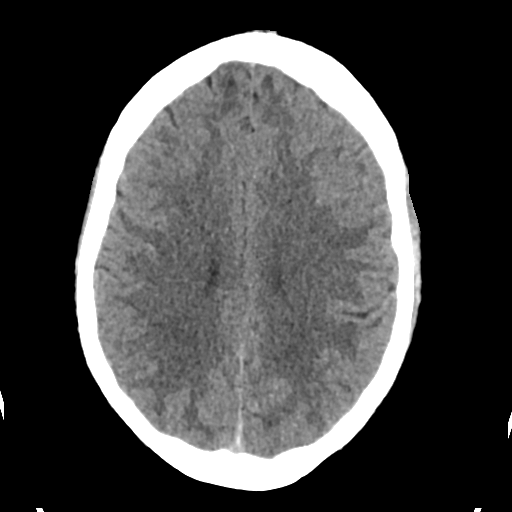
[im 18/29  bone]
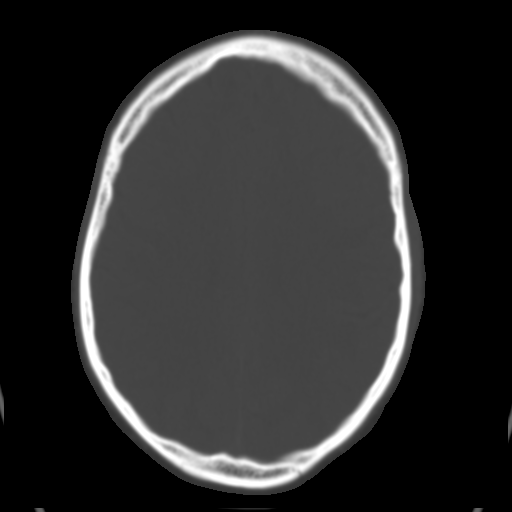
[im 22/29  brain]
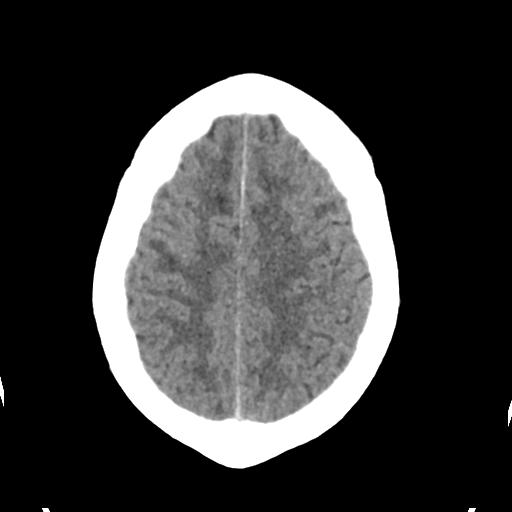
[im 25/29  brain]
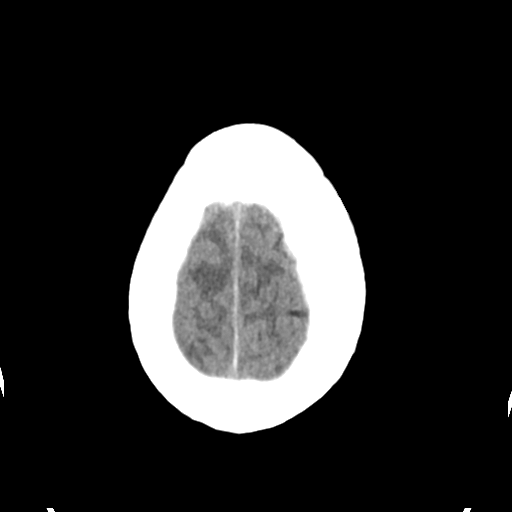

[Series 4: head bone · axial · 0.39mm/px · z∈[-133,-105]mm · 3 of 73 slices shown]
[im 8/73  bone]
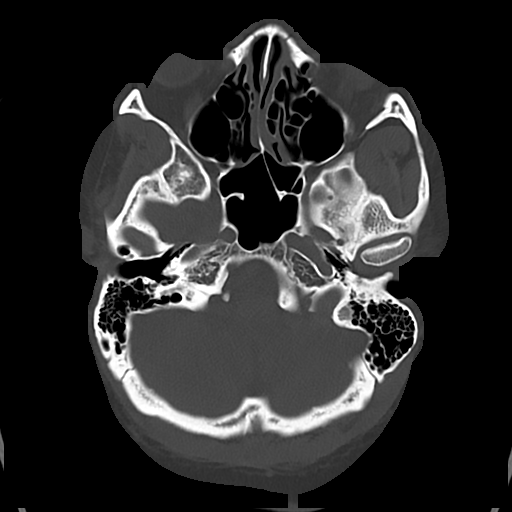
[im 15/73  bone]
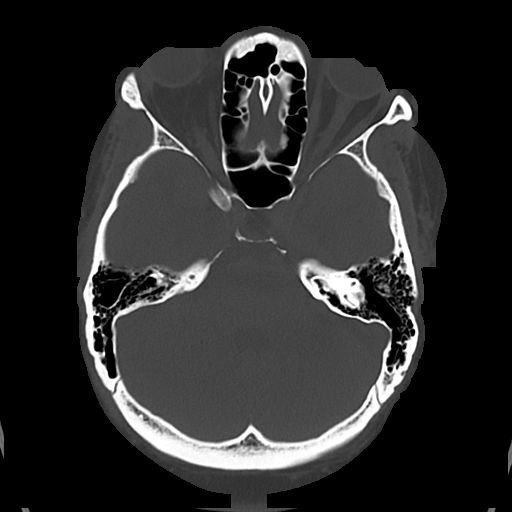
[im 22/73  bone]
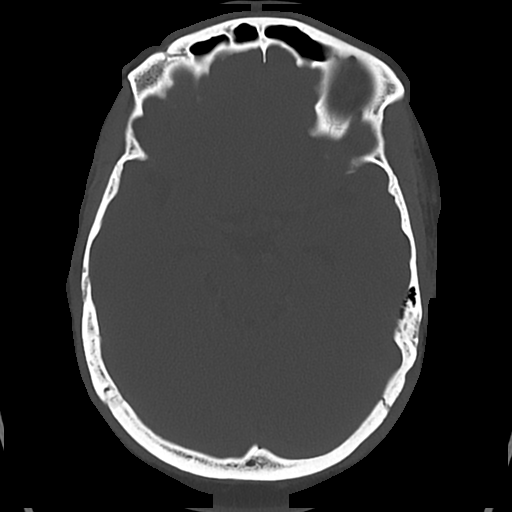

[Series 5: cor soft · coronal · 0.28mm/px · 3 of 71 slices shown]
[im 24/71  brain]
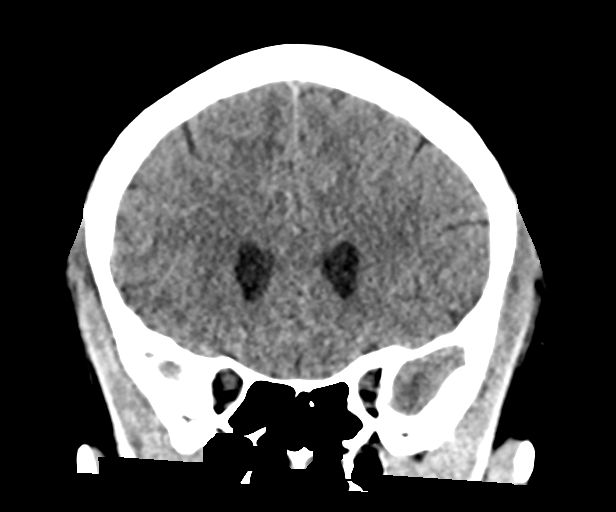
[im 32/71  brain]
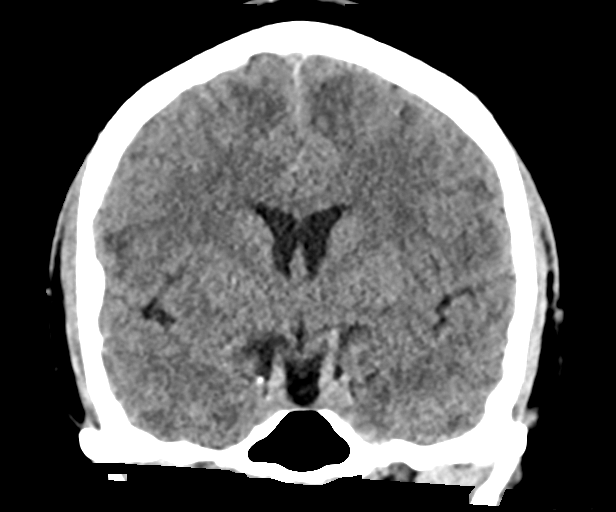
[im 39/71  brain]
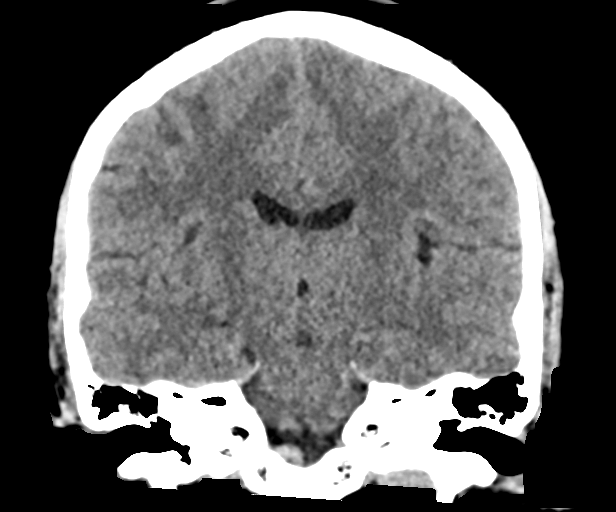

[Series 6: sag soft · sagittal · 0.28mm/px · 3 of 58 slices shown]
[im 20/58  brain]
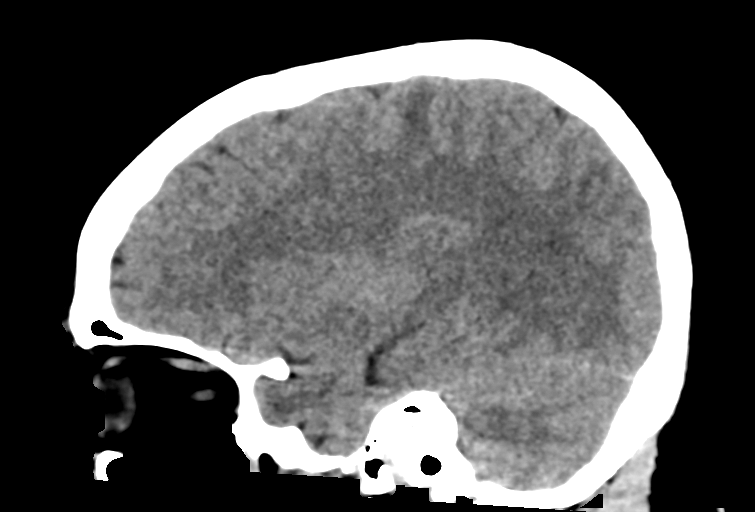
[im 29/58  brain]
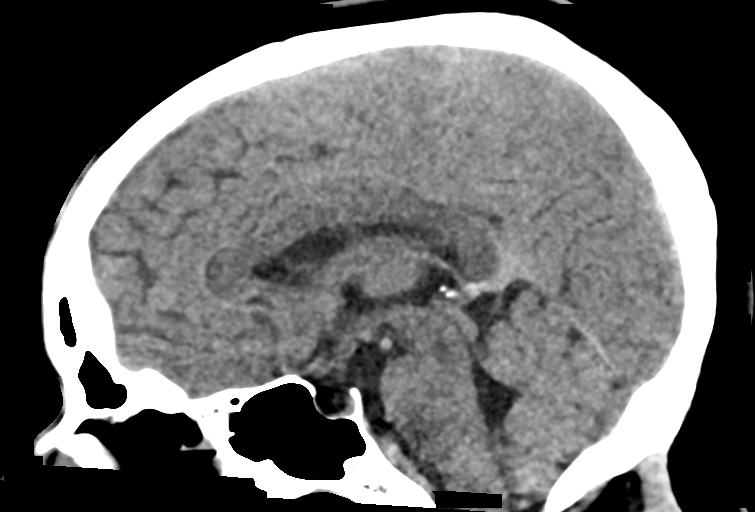
[im 39/58  brain]
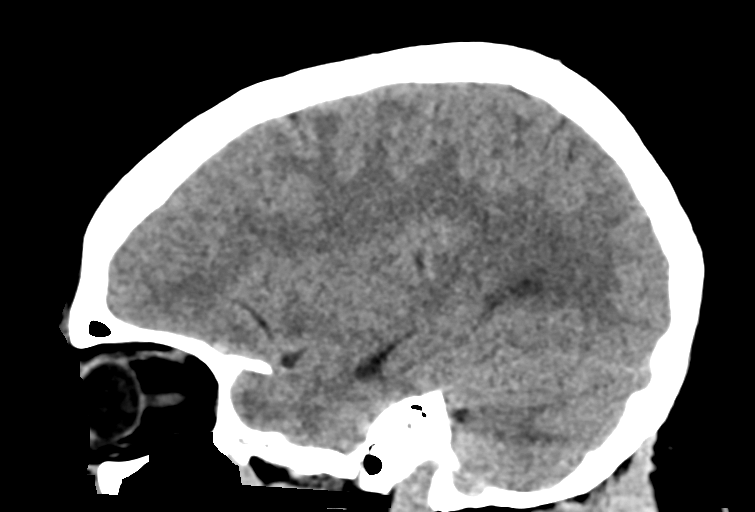

[16 of 47 positions shown; findings below may reference images not displayed]

FINDINGS: Brain: Ventricular system normal in size and appearance for age.
Mild changes of small vessel disease of the white matter diffusely,
unchanged from the prior CT and MRI. No mass lesion. No midline
shift. No acute hemorrhage or hematoma. No extra-axial fluid
collections. No evidence of acute infarction.

Vascular: No hyperdense vessel.  No visible atherosclerosis.

Skull: No skull fracture or other focal osseous abnormality
involving the skull.

Sinuses/Orbits: Visualized paranasal sinuses, bilateral mastoid air
cells and bilateral middle ear cavities well-aerated. Visualized
orbits and globes normal in appearance.

Other: None.
IMPRESSION: 1. No acute intracranial abnormality.
2. Stable mild chronic microvascular ischemic changes of the white
matter.

## 2021-11-20 IMAGING — CR DG CHEST 2V
2 series · 2 of 2 positions shown · non-contrast
Comparison: June 06, 2018

CLINICAL DATA: Elevated blood pressure in EKG changes

EXAM:
CHEST - 2 VIEW

[chest pa]
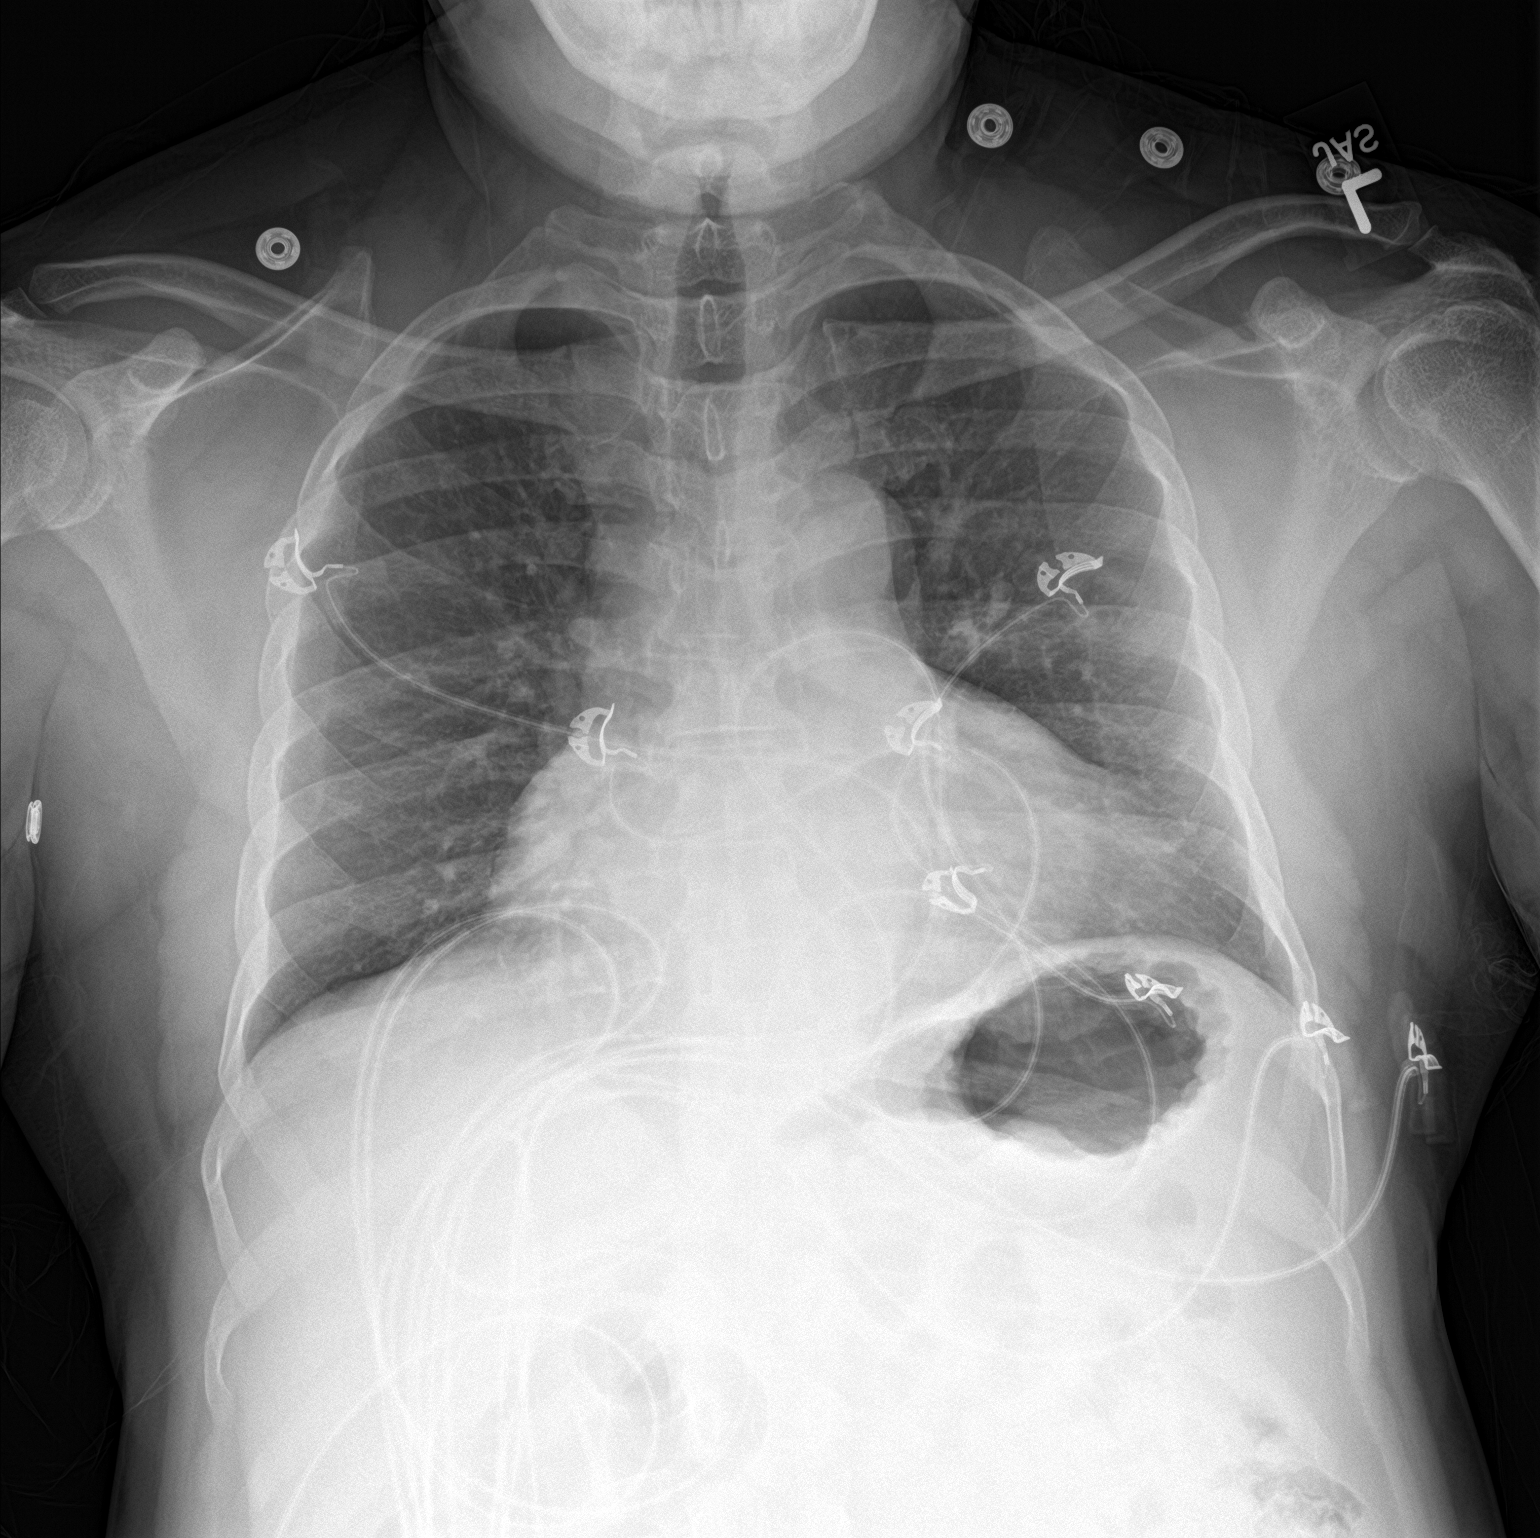

[chest lat]
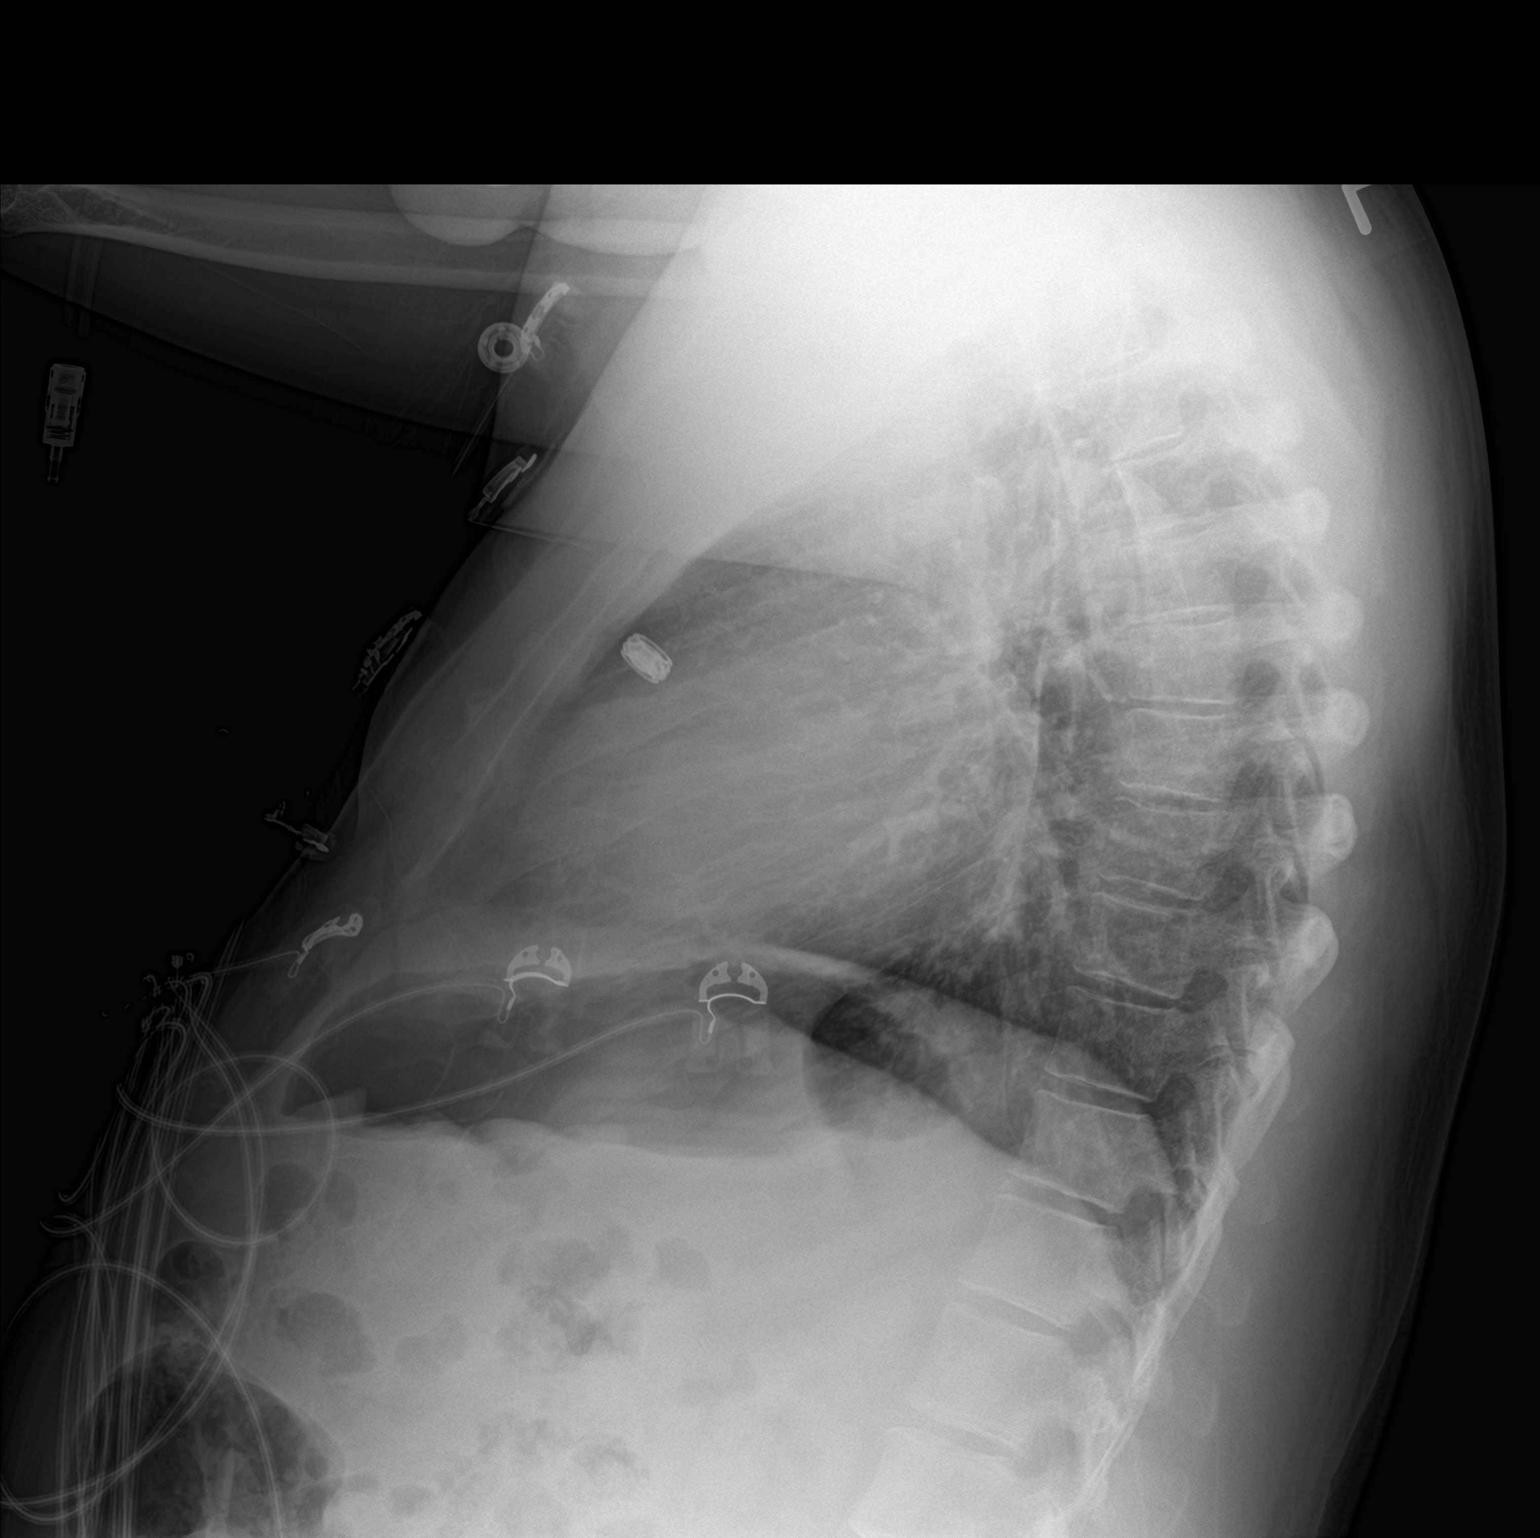

[2 of 2 positions shown; findings below may reference images not displayed]

FINDINGS: Trachea midline. Cardiomediastinal contours and hilar structures are
accentuated by slightly low lung volumes and portable technique.

Retrocardiac opacity behind the RIGHT heart border compatible with
lower lobe process partially obscuring the medial aspect of the
RIGHT hemidiaphragm and also seen on the lateral view.

On limited assessment skeletal structures without acute process.
IMPRESSION: Suspect RIGHT lower lobe airspace disease/pneumonia. Small lesion in
the lung behind the RIGHT heart is also considered. Follow-up is
suggested to ensure resolution.

## 2022-01-08 NOTE — Progress Notes (Unsigned)
Cardiology Clinic Note   Patient Name: John Savage Date of Encounter: 01/09/2022  Primary Care Provider:  Kerin Perna, NP Primary Cardiologist:  Quay Burow, MD  Patient Profile    John Savage 41 year old male presents to clinic today for follow-up evaluation of her hyperlipidemia and hypertension.  Past Medical History    Past Medical History:  Diagnosis Date   Hypertension    No past surgical history on file.  Allergies  Allergies  Allergen Reactions   Aspirin Other (See Comments)    Unknown reaction   Novocain [Procaine] Other (See Comments)    Unknown reaction    History of Present Illness    John Savage has a PMH of LVH, HLD, HTN, abnormal brain MRI, and headache.  He was initially referred by his PCP for evaluation of his hypertension and abnormal EKG.  He is from [place].  He works at the call center.  He has lived in the [place] for 15 years.  He was previously hospitalized for upper respiratory infection.  He was found to be hypertensive.  His medications were adjusted.  He was placed on HCTZ and lisinopril.  He was seen in follow-up by Dr. Gwenlyn Found on 12/29/2020.  During that time he continued to do well.  He was taking his medications as prescribed.  He was checking his blood pressure at home.  He denied chest pain and shortness of breath.  He presents to the clinic today for follow-up evaluation states he feels fairly well today.  He continues to work for [employer] in the call center.  He does report that his blood pressure is somewhat elevated at home.  He has been taking his blood pressure prior to taking his blood pressure medication.  He is noticing changes with his vision.  He also reports sexual dysfunction.  Initially in the office today his blood pressure is 148/79 and on recheck is 126/76.  We reviewed his blood pressure medication and he expressed understanding.  He notices episodes of dizziness in the  evening.  I recommended that he increase his p.o. hydration.  We discussed options for eye exam.  I have instructed him to follow-up with his PCP to evaluate/discuss sexual dysfunction.  We will plan follow-up in 12 months.  I ordered BMP, CBC, lipid and LFTs.  We gave the salty 6 diet information and encouraged to increase physical activity.  He denies chest pain, shortness of breath, lower extremity edema, fatigue, palpitations, melena, hematuria, hemoptysis, diaphoresis, weakness, presyncope, syncope, orthopnea, and PND.     Home Medications    Prior to Admission medications   Medication Sig Start Date End Date Taking? Authorizing Provider  acetaminophen (TYLENOL) 325 MG tablet Take 650 mg by mouth every 6 (six) hours as needed.    [provider]  amLODipine (NORVASC) 10 MG tablet Take 1 tablet (10 mg total) by mouth daily. 05/11/21 08/09/21  Lorretta Harp, MD  amoxicillin (AMOXIL) 875 MG tablet Take 1 tablet (875 mg total) by mouth 2 (two) times daily. 06/20/21   Scot Jun, FNP  atorvastatin (LIPITOR) 40 MG tablet Take 1 tablet (40 mg total) by mouth daily. 07/06/21 10/04/21  Lorretta Harp, MD  carvedilol (COREG) 25 MG tablet Take 1 tablet (25 mg total) by mouth 2 (two) times daily with a meal. 07/06/21 10/04/21  Lorretta Harp, MD  hydrALAZINE (APRESOLINE) 50 MG tablet Take 1 tablet (50 mg total) by mouth in the morning and at bedtime. 03/09/21  06/20/21  Lorretta Harp, MD  ibuprofen (ADVIL) 200 MG tablet Take 400 mg by mouth every 6 (six) hours as needed (pain/headache).    [provider]  olmesartan (BENICAR) 20 MG tablet Take 1 tablet (20 mg total) by mouth daily. 07/06/21   Lorretta Harp, MD  tobramycin (TOBREX) 0.3 % ophthalmic solution Place 2 drops into both eyes every 6 (six) hours. 10/09/21   Ward, Lenise Arena, PA-C    Family History    Family History  Problem Relation Age of Onset   Healthy Mother    Healthy Father    He indicated that his  mother is alive. He indicated that his father is alive.  Social History    Social History   Socioeconomic History   Marital status: Married    Spouse name: Not on file   Number of children: Not on file   Years of education: Not on file   Highest education level: Not on file  Occupational History   Not on file  Tobacco Use   Smoking status: Never   Smokeless tobacco: Never  Vaping Use   Vaping Use: Never used  Substance and Sexual Activity   Alcohol use: No   Drug use: No   Sexual activity: Not on file  Other Topics Concern   Not on file  Social History Narrative   Right handed   Lives at home with wife and kids   Social Determinants of Health   Financial Resource Strain: Not on file  Food Insecurity: Not on file  Transportation Needs: Not on file  Physical Activity: Not on file  Stress: Not on file  Social Connections: Not on file  Intimate Partner Violence: Not on file     Review of Systems    General:  No chills, fever, night sweats or weight changes.  Cardiovascular:  No chest pain, dyspnea on exertion, edema, orthopnea, palpitations, paroxysmal nocturnal dyspnea. Dermatological: No rash, lesions/masses Respiratory: No cough, dyspnea Urologic: No hematuria, dysuria Abdominal:   No nausea, vomiting, diarrhea, bright red blood per rectum, melena, or hematemesis Neurologic:  No visual changes, wkns, changes in mental status. All other systems reviewed and are otherwise negative except as noted above.  Physical Exam    VS:  BP 126/76   Pulse 75   Ht 5' 0.6" (1.539 m)   Wt 176 lb 6.4 oz (80 kg)   SpO2 97%   BMI 33.77 kg/m  , BMI Body mass index is 33.77 kg/m. GEN: Well nourished, well developed, in no acute distress. HEENT: normal. Neck: Supple, no JVD, carotid bruits, or masses. Cardiac: RRR, no murmurs, rubs, or gallops. No clubbing, cyanosis, edema.  Radials/DP/PT 2+ and equal bilaterally.  Respiratory:  Respirations regular and unlabored, clear to  auscultation bilaterally. GI: Soft, nontender, nondistended, BS + x 4. MS: no deformity or atrophy. Skin: warm and dry, no rash. Neuro:  Strength and sensation are intact. Psych: Normal affect.  Accessory Clinical Findings    Recent Labs: 06/05/2021: BUN 12; Creatinine, Ser 1.24; Potassium 3.9; Sodium 141   Recent Lipid Panel    Component Value Date/Time   CHOL 115 08/01/2020 0935   TRIG 102 08/01/2020 0935   HDL 35 (L) 08/01/2020 0935   CHOLHDL 3.3 08/01/2020 0935   CHOLHDL 6.6 12/14/2019 0826   VLDL 50 (H) 12/14/2019 0826   LDLCALC 61 08/01/2020 0935         ECG personally reviewed by me today-normal sinus rhythm T wave abnormality prolonged QT  75 bpm- No acute changes  Echocardiogram 12/13/2019 IMPRESSIONS     1. Limited study for wall motion   2. Left ventricular ejection fraction, by estimation, is 50 to 55%. The  left ventricle has low normal function. There is moderate hypokinesis of  the left ventricular, entire inferior wall and lateral wall. Findings  suggestive of LCx territory  ischemia/infarct.   Comparison(s): Changes from prior study are noted. 12/12/2019: LVEF 55-60%,  possible inferior/inferolateral WMA.   FINDINGS   Left Ventricle: Left ventricular ejection fraction, by estimation, is 50  to 55%. The left ventricle has low normal function. Moderate hypokinesis  of the left ventricular, entire inferior wall and lateral wall. Definity  contrast agent was given IV to  delineate the left ventricular endocardial borders.      LV Wall Scoring:  The anterior wall, entire lateral wall, and basal inferior segment are  hypokinetic. The entire septum, mid and distal inferior wall, apical  anterior  segment, and apex are normal. Findings suggestive of LCx territory  ischemia/infarct.   Lyman Bishop MD  Electronically signed by Lyman Bishop MD  Signature Date/Time: 12/13/2019/11:54:00 AM      Assessment & Plan   1.  Essential hypertension-BP today  126/76.   Continue carvedilol, olmesartan, amlodipine, hydralazine Heart healthy low-sodium diet-salty 6 given Increase physical activity as tolerated  Hyperlipidemia- on atorvastatin Continue current therapy Heart healthy low-sodium diet-salty 6 given Increase physical activity as tolerated Repeat fasting lipids and LFTs  LVH-no increased DOE or activity intolerance.  Echocardiogram 12/13/2019 showed an LVEF of 50-55%, moderate hypokinesis, and findings suggestive of circumflex territory ischemia/infarct.  Details above.  Discussed importance of good blood pressure control Continue amlodipine, carvedilol, hydralazine, olmesartan Continue to monitor   Sexual dysfunction-reports issues with premature ejaculation. Follow-up with PCP  Disposition: Follow up with Dr. Gwenlyn Found or me in 1 year.  Jossie Ng. Lazarius Rivkin NP-C     01/09/2022, 9:48 AM [address] Office [phone] Fax [phone]  Notice: This dictation was prepared with Dragon dictation along with smaller phrase technology. Any transcriptional errors that result from this process are unintentional and may not be corrected upon review.  I spent 14 minutes examining this patient, reviewing medications, and using patient centered shared decision making involving her cardiac care.  Prior to her visit I spent greater than 20 minutes reviewing her past medical history,  medications, and prior cardiac tests.

## 2022-01-09 ENCOUNTER — Ambulatory Visit: Payer: 59 | Attending: General Practice | Admitting: General Practice

## 2022-01-09 ENCOUNTER — Encounter: Payer: Self-pay | Admitting: General Practice

## 2022-01-09 VITALS — BP 126/76 | HR 75 | Ht 60.6 in | Wt 176.4 lb

## 2022-01-09 DIAGNOSIS — I517 Cardiomegaly: Secondary | ICD-10-CM

## 2022-01-09 DIAGNOSIS — E782 Mixed hyperlipidemia: Secondary | ICD-10-CM | POA: Diagnosis not present

## 2022-01-09 DIAGNOSIS — I10 Essential (primary) hypertension: Secondary | ICD-10-CM | POA: Diagnosis not present

## 2022-01-09 LAB — CBC
Hematocrit: 38.4 % (ref 37.5–51.0)
Hemoglobin: 13.4 g/dL (ref 13.0–17.7)
MCH: 30.7 pg (ref 26.6–33.0)
MCHC: 34.9 g/dL (ref 31.5–35.7)
MCV: 88 fL (ref 79–97)
Platelets: 358 10*3/uL (ref 150–450)
RBC: 4.37 x10E6/uL (ref 4.14–5.80)
RDW: 11.7 % (ref 11.6–15.4)
WBC: 5.1 10*3/uL (ref 3.4–10.8)

## 2022-01-09 LAB — LIPID PANEL
Chol/HDL Ratio: 5 ratio (ref 0.0–5.0)
Cholesterol, Total: 231 mg/dL — ABNORMAL HIGH (ref 100–199)
HDL: 46 mg/dL (ref >39)
LDL Chol Calc (NIH): 158 mg/dL — ABNORMAL HIGH (ref 0–99)
Triglycerides: 149 mg/dL (ref 0–149)
VLDL Cholesterol Cal: 27 mg/dL (ref 5–40)

## 2022-01-09 LAB — HEPATIC FUNCTION PANEL
ALT: 35 IU/L (ref 0–44)
AST: 19 IU/L (ref 0–40)
Albumin: 4.4 g/dL (ref 4.1–5.1)
Alkaline Phosphatase: 96 IU/L (ref 44–121)
Bilirubin Total: 0.3 mg/dL (ref 0.0–1.2)
Bilirubin, Direct: <0.1 mg/dL (ref 0.00–0.40)
Total Protein: 7 g/dL (ref 6.0–8.5)

## 2022-01-09 LAB — BASIC METABOLIC PANEL
BUN/Creatinine Ratio: 10 (ref 9–20)
BUN: 12 mg/dL (ref 6–24)
CO2: 23 mmol/L (ref 20–29)
Calcium: 9.4 mg/dL (ref 8.7–10.2)
Chloride: 103 mmol/L (ref 96–106)
Creatinine, Ser: 1.19 mg/dL (ref 0.76–1.27)
Glucose: 162 mg/dL — ABNORMAL HIGH (ref 70–99)
Potassium: 3.3 mmol/L — ABNORMAL LOW (ref 3.5–5.2)
Sodium: 141 mmol/L (ref 134–144)
eGFR: 79 mL/min/{1.73_m2} (ref >59)

## 2022-01-09 MED ORDER — CARVEDILOL 25 MG PO TABS: 25.0000 mg | ORAL_TABLET | Freq: Two times a day (BID) | ORAL | 3 refills | Status: DC

## 2022-01-09 MED ORDER — OLMESARTAN MEDOXOMIL 20 MG PO TABS: 20.0000 mg | ORAL_TABLET | Freq: Every day | ORAL | 3 refills | Status: DC

## 2022-01-09 NOTE — Patient Instructions (Signed)
Medication Instructions:  The current medical regimen is effective;  continue present plan and medications as directed. Please refer to the Current Medication list given to you today.  *If you need a refill on your cardiac medications before your next appointment, please call your pharmacy*  Lab Work: LIPID, LFT, CBC AND BMET TODAY If you have labs (blood work) drawn today and your tests are completely normal, you will receive your results only by:  [place] (if you have MyChart) OR A paper copy in the mail  If you have any lab test that is abnormal or we need to change your treatment, we will call you to review the results.  Other Instructions EYE EXAM WITH LENS CRAFTERS  DISCUSS SEXUAL DYSFUNCTION WITH YOUR PRIMARY MD  PLEASE READ AND FOLLOW ATTACHED  SALTY 6  PLEASE INCREASE PHYSICAL ACTIVITY AS TOLERATED  Follow-Up: At [facility], you and your health needs are our priority.  As part of our continuing mission to provide you with exceptional heart care, we have created designated Provider Care Teams.  These Care Teams include your primary Cardiologist (physician) and Advanced Practice Providers (APPs -  Physician Assistants and Nurse Practitioners) who all work together to provide you with the care you need, when you need it.  Your next appointment:   12 month(s)  The format for your next appointment:   In Person  Provider:   Quay Burow, MD  or Coletta Memos, FNP      Important Information About Sugar

## 2022-01-11 ENCOUNTER — Other Ambulatory Visit: Payer: Self-pay

## 2022-01-11 MED ORDER — POTASSIUM CHLORIDE CRYS ER 20 MEQ PO TBCR: 20.0000 meq | EXTENDED_RELEASE_TABLET | Freq: Every day | ORAL | 0 refills | Status: DC

## 2022-01-18 ENCOUNTER — Other Ambulatory Visit: Payer: Self-pay

## 2022-01-18 DIAGNOSIS — Z79899 Other long term (current) drug therapy: Secondary | ICD-10-CM

## 2022-01-18 DIAGNOSIS — I10 Essential (primary) hypertension: Secondary | ICD-10-CM

## 2022-01-18 DIAGNOSIS — E782 Mixed hyperlipidemia: Secondary | ICD-10-CM

## 2022-01-18 MED ORDER — POTASSIUM CHLORIDE CRYS ER 20 MEQ PO TBCR: 20.0000 meq | EXTENDED_RELEASE_TABLET | Freq: Every day | ORAL | 0 refills | Status: DC

## 2022-01-25 DIAGNOSIS — Z79899 Other long term (current) drug therapy: Secondary | ICD-10-CM

## 2022-01-25 DIAGNOSIS — E782 Mixed hyperlipidemia: Secondary | ICD-10-CM

## 2022-01-25 DIAGNOSIS — I10 Essential (primary) hypertension: Secondary | ICD-10-CM

## 2022-02-19 ENCOUNTER — Other Ambulatory Visit: Payer: Self-pay | Admitting: Cardiovascular Disease

## 2022-03-19 ENCOUNTER — Other Ambulatory Visit: Payer: Self-pay | Admitting: Cardiovascular Disease

## 2022-03-19 DIAGNOSIS — I10 Essential (primary) hypertension: Secondary | ICD-10-CM

## 2022-03-22 ENCOUNTER — Other Ambulatory Visit: Payer: Self-pay

## 2022-03-22 LAB — BASIC METABOLIC PANEL
BUN/Creatinine Ratio: 8 — ABNORMAL LOW (ref 9–20)
BUN: 9 mg/dL (ref 6–24)
CO2: 24 mmol/L (ref 20–29)
Calcium: 9.1 mg/dL (ref 8.7–10.2)
Chloride: 101 mmol/L (ref 96–106)
Creatinine, Ser: 1.15 mg/dL (ref 0.76–1.27)
Glucose: 131 mg/dL — ABNORMAL HIGH (ref 70–99)
Potassium: 3.3 mmol/L — ABNORMAL LOW (ref 3.5–5.2)
Sodium: 141 mmol/L (ref 134–144)
eGFR: 83 mL/min/{1.73_m2} (ref >59)

## 2022-04-04 ENCOUNTER — Other Ambulatory Visit: Payer: Self-pay

## 2022-04-04 ENCOUNTER — Telehealth (INDEPENDENT_AMBULATORY_CARE_PROVIDER_SITE_OTHER): Payer: Self-pay | Admitting: Primary Care

## 2022-04-04 DIAGNOSIS — E782 Mixed hyperlipidemia: Secondary | ICD-10-CM

## 2022-04-04 DIAGNOSIS — I517 Cardiomegaly: Secondary | ICD-10-CM

## 2022-04-04 DIAGNOSIS — Z79899 Other long term (current) drug therapy: Secondary | ICD-10-CM

## 2022-04-04 DIAGNOSIS — I10 Essential (primary) hypertension: Secondary | ICD-10-CM

## 2022-04-04 NOTE — Progress Notes (Unsigned)
Letter mailed

## 2022-04-04 NOTE — Telephone Encounter (Signed)
Attempted to reach patient to schedule an appointment. Was last seen in 2012. Left a voicemail message for him to call and schedule an appointment.

## 2022-06-09 ENCOUNTER — Other Ambulatory Visit: Payer: Self-pay | Admitting: Cardiovascular Disease

## 2022-06-09 DIAGNOSIS — E782 Mixed hyperlipidemia: Secondary | ICD-10-CM

## 2022-09-02 ENCOUNTER — Other Ambulatory Visit: Payer: Self-pay | Admitting: General Practice

## 2022-09-02 DIAGNOSIS — I10 Essential (primary) hypertension: Secondary | ICD-10-CM

## 2022-09-16 ENCOUNTER — Ambulatory Visit (HOSPITAL_COMMUNITY)
Admission: EM | Admit: 2022-09-16 | Discharge: 2022-09-16 | Disposition: A | Discharge status: Home / Self Care | Payer: 59 | Attending: Physician Assistant | Admitting: Physician Assistant

## 2022-09-16 ENCOUNTER — Encounter (HOSPITAL_COMMUNITY): Payer: Self-pay

## 2022-09-16 DIAGNOSIS — H109 Unspecified conjunctivitis: Secondary | ICD-10-CM

## 2022-09-16 MED ORDER — TOBRAMYCIN 0.3 % OP SOLN: 2.0000 [drp] | Freq: Four times a day (QID) | OPHTHALMIC | 0 refills | Status: AC

## 2022-09-16 NOTE — ED Provider Notes (Signed)
[facility]    CSN: [id number] Arrival date & time: 09/16/22  1937      History   Chief Complaint Chief Complaint  Patient presents with   Eye Problem    HPI John Savage is a 42 y.o. male.   Pt presents with left eye redness, drainage, crusting that started about one week ago.  Denies visual disturbances. He has tried nothing for the sx.  Pt reports he did not take his blood pressure medications today.  Elevated blood pressure.  He is asymptomatic at this time.  He follows with cardiology.  He is able to check his blood pressure at home.   173/105  BP Readings from Last 3 Encounters: 09/16/22 : (!) 202/110 01/09/22 : 126/76 10/09/21 : (!) 152/95      Past Medical History:  Diagnosis Date   Hypertension     Patient Active Problem List   Diagnosis Date Noted   Hyperlipidemia 06/27/2020   Hypertensive emergency 12/11/2019   Left ventricular hypertrophy by electrocardiogram 07/28/2018   Acute upper respiratory infection 06/07/2018   Headache 06/07/2018   Abnormal brain MRI 06/07/2018   Severe hypertension 06/06/2018    History reviewed. No pertinent surgical history.     Home Medications    Prior to Admission medications   Medication Sig Start Date End Date Taking? Authorizing Provider  acetaminophen (TYLENOL) 325 MG tablet Take 650 mg by mouth every 6 (six) hours as needed.   Yes [provider]  amLODipine (NORVASC) 10 MG tablet TAKE 1 TABLET BY MOUTH EVERY DAY 03/20/22  Yes Runell Gess, MD  atorvastatin (LIPITOR) 40 MG tablet TAKE 1 TABLET BY MOUTH EVERY DAY 06/10/22  Yes Cleaver, Thomasene Ripple, NP  carvedilol (COREG) 25 MG tablet Take 1 tablet (25 mg total) by mouth 2 (two) times daily with a meal. 01/09/22  Yes Cleaver, Thomasene Ripple, NP  hydrALAZINE (APRESOLINE) 50 MG tablet Take 1 tablet (50 mg total) by mouth in the morning and at bedtime. 02/19/22  Yes Runell Gess, MD  ibuprofen (ADVIL) 200 MG tablet Take 400 mg by mouth  every 6 (six) hours as needed (pain/headache).   Yes [provider]  olmesartan (BENICAR) 20 MG tablet TAKE 1 TABLET BY MOUTH EVERY DAY 09/03/22  Yes Cleaver, Thomasene Ripple, NP  amoxicillin (AMOXIL) 875 MG tablet Take 1 tablet (875 mg total) by mouth 2 (two) times daily. 06/20/21   Bing Neighbors, NP  potassium chloride SA (KLOR-CON M20) 20 MEQ tablet Take 1 tablet (20 mEq total) by mouth daily for 4 days. 01/18/22 01/22/22  Ronney Asters, NP  tobramycin (TOBREX) 0.3 % ophthalmic solution Place 2 drops into both eyes every 6 (six) hours for 5 days. 09/16/22 09/21/22  Ward, Tylene Fantasia, PA-C    Family History Family History  Problem Relation Age of Onset   Healthy Mother    Healthy Father     Social History Social History   Tobacco Use   Smoking status: Never   Smokeless tobacco: Never  Vaping Use   Vaping Use: Never used  Substance Use Topics   Alcohol use: No   Drug use: No     Allergies   Aspirin and Novocain [procaine]   Review of Systems Review of Systems  Constitutional:  Negative for chills and fever.  HENT:  Negative for ear pain and sore throat.   Eyes:  Positive for discharge and redness. Negative for pain and visual disturbance.  Respiratory:  Negative for cough  and shortness of breath.   Cardiovascular:  Negative for chest pain and palpitations.  Gastrointestinal:  Negative for abdominal pain and vomiting.  Genitourinary:  Negative for dysuria and hematuria.  Musculoskeletal:  Negative for arthralgias and back pain.  Skin:  Negative for color change and rash.  Neurological:  Negative for seizures and syncope.  All other systems reviewed and are negative.    Physical Exam Triage Vital Signs ED Triage Vitals [09/16/22 1958]  Enc Vitals Group     BP (!) 202/110     Pulse Rate 89     Resp 18     Temp 98.3 F (36.8 C)     Temp Source Oral     SpO2 96 %     Weight      Height      Head Circumference      Peak Flow      Pain Score      Pain Loc       Pain Edu?      Excl. in GC?    No data found.  Updated Vital Signs BP (!) 202/110 (BP Location: Left Arm) Comment: 189/115  Pulse 89   Temp 98.3 F (36.8 C) (Oral)   Resp 18   SpO2 96%   Visual Acuity Right Eye Distance:   Left Eye Distance:   Bilateral Distance:    Right Eye Near:   Left Eye Near:    Bilateral Near:     Physical Exam Vitals and nursing note reviewed.  Constitutional:      General: He is not in acute distress.    Appearance: He is well-developed.  HENT:     Head: Normocephalic and atraumatic.  Eyes:     General:        Left eye: Discharge present.    Conjunctiva/sclera:     Left eye: Left conjunctiva is injected.  Cardiovascular:     Rate and Rhythm: Normal rate and regular rhythm.     Heart sounds: No murmur heard. Pulmonary:     Effort: Pulmonary effort is normal. No respiratory distress.     Breath sounds: Normal breath sounds.  Abdominal:     Palpations: Abdomen is soft.     Tenderness: There is no abdominal tenderness.  Musculoskeletal:        General: No swelling.     Cervical back: Neck supple.  Skin:    General: Skin is warm and dry.     Capillary Refill: Capillary refill takes less than 2 seconds.  Neurological:     Mental Status: He is alert.  Psychiatric:        Mood and Affect: Mood normal.      UC Treatments / Results  Labs (all labs ordered are listed, but only abnormal results are displayed) Labs Reviewed - No data to display  EKG   Radiology No results found.  Procedures Procedures (including critical care time)  Medications Ordered in UC Medications - No data to display  Initial Impression / Assessment and Plan / UC Course  I have reviewed the triage vital signs and the nursing notes.  Pertinent labs & imaging results that were available during my care of the patient were reviewed by me and considered in my medical decision making (see chart for details).     Left eye bacterial conjunctivitis.   Antibiotic drops prescribed  Elevated BP in clinic 173/105 on recheck.  Pt is asymptomatic at this time.  He can check his bp at  home.  Advised to take his meds and recheck at home.  If it remains elevated advised to contact cardiologist.  ED precautions given.  Final Clinical Impressions(s) / UC Diagnoses   Final diagnoses:  Bacterial conjunctivitis of left eye     Discharge Instructions      Use eye drops as prescribed  Continue to monitor blood pressure, take blood pressure medications as prescribed.  If it remains elevated please follow up with cardiology.  If you develop chest pain, shortness of breath, headache, blurred vision return for evaluation.      ED Prescriptions     Medication Sig Dispense Auth. Provider   tobramycin (TOBREX) 0.3 % ophthalmic solution Place 2 drops into both eyes every 6 (six) hours for 5 days. 5 mL Ward, Tylene Fantasia, PA-C      PDMP not reviewed this encounter.   Ward, Tylene Fantasia, PA-C 09/16/22 2017

## 2022-09-16 NOTE — ED Triage Notes (Signed)
Here for red itchy eyes X 1 week. Pt reports his blood pressure is high due to him not taking all his medication today. Pt stated he had a headache yesterday.

## 2022-09-16 NOTE — Discharge Instructions (Signed)
Use eye drops as prescribed  Continue to monitor blood pressure, take blood pressure medications as prescribed.  If it remains elevated please follow up with cardiology.  If you develop chest pain, shortness of breath, headache, blurred vision return for evaluation.

## 2022-12-11 ENCOUNTER — Other Ambulatory Visit: Payer: Self-pay | Admitting: General Practice

## 2022-12-11 DIAGNOSIS — I10 Essential (primary) hypertension: Secondary | ICD-10-CM

## 2023-02-25 ENCOUNTER — Other Ambulatory Visit: Payer: Self-pay | Admitting: *Deleted

## 2023-03-10 ENCOUNTER — Other Ambulatory Visit: Payer: Self-pay

## 2023-03-10 DIAGNOSIS — I10 Essential (primary) hypertension: Secondary | ICD-10-CM

## 2023-03-10 MED ORDER — AMLODIPINE BESYLATE 10 MG PO TABS: 10.0000 mg | ORAL_TABLET | Freq: Every day | ORAL | 0 refills | Status: DC

## 2023-03-10 MED ORDER — HYDRALAZINE HCL 50 MG PO TABS: 50.0000 mg | ORAL_TABLET | Freq: Two times a day (BID) | ORAL | 0 refills | Status: DC

## 2023-03-10 NOTE — Telephone Encounter (Signed)
The patient was notified that he still has refills on his olmesartan and refills for hydrazaline and amlodopine has been faxed to his pharmacy.  The patient was also reminded about his upcoming appointment.

## 2023-03-11 ENCOUNTER — Other Ambulatory Visit: Payer: Self-pay | Admitting: General Practice

## 2023-03-11 DIAGNOSIS — E782 Mixed hyperlipidemia: Secondary | ICD-10-CM

## 2023-03-18 NOTE — Progress Notes (Signed)
Cardiology Clinic Note   Patient Name: John Savage Date of Encounter: 03/21/2023  Primary Care Provider:  Grayce Sessions, NP Primary Cardiologist:  Nanetta Batty, MD  Patient Profile    John Savage 42 year old male presents to clinic today for follow-up evaluation of her hyperlipidemia and hypertension.  Past Medical History    Past Medical History:  Diagnosis Date   Hypertension    History reviewed. No pertinent surgical history.  Allergies  Allergies  Allergen Reactions   Novocain [Procaine] Other (See Comments)    Unknown reaction    History of Present Illness    John Savage has a PMH of LVH, HLD, HTN, abnormal brain MRI, and headache.  He was initially referred by his PCP for evaluation of his hypertension and abnormal EKG.  He is from [place].  He works at the call center.  He has lived in the [place] for 15 years.  He was previously hospitalized for upper respiratory infection.  He was found to be hypertensive.  His medications were adjusted.  He was placed on HCTZ and lisinopril.  He was seen in follow-up by Dr. Allyson Sabal on 12/29/2020.  During that time he continued to do well.  He was taking his medications as prescribed.  He was checking his blood pressure at home.  He denied chest pain and shortness of breath.  He presented to the clinic 01/09/22 for follow-up evaluation stated he felt fairly well .  He continued to work for [employer]  in the call center.  He did report that his blood pressure was somewhat elevated at home.  He had been taking his blood pressure prior to taking his blood pressure medication.  He was noticing changes with his vision.  He also reported sexual dysfunction.  Initially in the office his blood pressure was 148/79 and on recheck was 126/76.  We reviewed his blood pressure medication and he expressed understanding.  He noticed episodes of dizziness in the evening.  I recommended that he increase his  p.o. hydration.  We discussed options for eye exam.  I  instructed him to follow-up with his PCP to evaluate/discuss sexual dysfunction.  We  planned follow-up in 12 months.  I ordered BMP, CBC, lipid and LFTs.  I gave the salty 6 diet information and encouraged him to increase his physical activity.  He presents to the clinic today for follow-up evaluation and states he continues to work with [employer].  He has been walking for about 30 minutes most days of the week.  He has been trying to follow a heart healthy diet.  We reviewed his medications and his previous head MRI.  He expressed understanding.  He reports that he has not been taking his olmesartan for 1 month.  He reports that his pharmacy did not have the medication.  His blood pressure initially today is 168/130 and on recheck it is 148/98.  Prior expressed importance of medication compliance.  I will order fasting lipids and LFTs today refill his medications and plan follow-up in 1 month.  He denies chest pain, shortness of breath, lower extremity edema, fatigue, palpitations, melena, hematuria, hemoptysis, diaphoresis, weakness, presyncope, syncope, orthopnea, and PND.     Home Medications    Prior to Admission medications   Medication Sig Start Date End Date Taking? Authorizing Provider  acetaminophen (TYLENOL) 325 MG tablet Take 650 mg by mouth every 6 (six) hours as needed.    [provider]  amLODipine (NORVASC) 10 MG tablet Take  1 tablet (10 mg total) by mouth daily. 05/11/21 08/09/21  Runell Gess, MD  amoxicillin (AMOXIL) 875 MG tablet Take 1 tablet (875 mg total) by mouth 2 (two) times daily. 06/20/21   Bing Neighbors, FNP  atorvastatin (LIPITOR) 40 MG tablet Take 1 tablet (40 mg total) by mouth daily. 07/06/21 10/04/21  Runell Gess, MD  carvedilol (COREG) 25 MG tablet Take 1 tablet (25 mg total) by mouth 2 (two) times daily with a meal. 07/06/21 10/04/21  Runell Gess, MD  hydrALAZINE (APRESOLINE) 50 MG tablet  Take 1 tablet (50 mg total) by mouth in the morning and at bedtime. 03/09/21 06/20/21  Runell Gess, MD  ibuprofen (ADVIL) 200 MG tablet Take 400 mg by mouth every 6 (six) hours as needed (pain/headache).    [provider]  olmesartan (BENICAR) 20 MG tablet Take 1 tablet (20 mg total) by mouth daily. 07/06/21   Runell Gess, MD  tobramycin (TOBREX) 0.3 % ophthalmic solution Place 2 drops into both eyes every 6 (six) hours. 10/09/21   Ward, Tylene Fantasia, PA-C    Family History    Family History  Problem Relation Age of Onset   Healthy Mother    Healthy Father    He indicated that his mother is alive. He indicated that his father is alive.  Social History    Social History   Socioeconomic History   Marital status: Married    Spouse name: Not on file   Number of children: Not on file   Years of education: Not on file   Highest education level: Not on file  Occupational History   Not on file  Tobacco Use   Smoking status: Never   Smokeless tobacco: Never  Vaping Use   Vaping status: Never Used  Substance and Sexual Activity   Alcohol use: No   Drug use: No   Sexual activity: Not on file  Other Topics Concern   Not on file  Social History Narrative   Right handed   Lives at home with wife and kids   Social Drivers of Health   Financial Resource Strain: Not on file  Food Insecurity: Not on file  Transportation Needs: Not on file  Physical Activity: Not on file  Stress: Not on file  Social Connections: Not on file  Intimate Partner Violence: Not on file     Review of Systems    General:  No chills, fever, night sweats or weight changes.  Cardiovascular:  No chest pain, dyspnea on exertion, edema, orthopnea, palpitations, paroxysmal nocturnal dyspnea. Dermatological: No rash, lesions/masses Respiratory: No cough, dyspnea Urologic: No hematuria, dysuria Abdominal:   No nausea, vomiting, diarrhea, bright red blood per rectum, melena, or  hematemesis Neurologic:  No visual changes, wkns, changes in mental status. All other systems reviewed and are otherwise negative except as noted above.  Physical Exam    VS:  BP (!) 148/98   Pulse 70   Ht 5\' 5"  (1.651 m)   Wt 173 lb 3.2 oz (78.6 kg)   SpO2 100%   BMI 28.82 kg/m  , BMI Body mass index is 28.82 kg/m. GEN: Well nourished, well developed, in no acute distress. HEENT: normal. Neck: Supple, no JVD, carotid bruits, or masses. Cardiac: RRR, no murmurs, rubs, or gallops. No clubbing, cyanosis, edema.  Radials/DP/PT 2+ and equal bilaterally.  Respiratory:  Respirations regular and unlabored, clear to auscultation bilaterally. GI: Soft, nontender, nondistended, BS + x 4. MS: no deformity or  atrophy. Skin: warm and dry, no rash. Neuro:  Strength and sensation are intact. Psych: Normal affect.  Accessory Clinical Findings    Recent Labs: 03/22/2022: BUN 9; Creatinine, Ser 1.15; Potassium 3.3; Sodium 141   Recent Lipid Panel    Component Value Date/Time   CHOL 231 (H) 01/09/2022 1003   TRIG 149 01/09/2022 1003   HDL 46 01/09/2022 1003   CHOLHDL 5.0 01/09/2022 1003   CHOLHDL 6.6 12/14/2019 0826   VLDL 50 (H) 12/14/2019 0826   LDLCALC 158 (H) 01/09/2022 1003    HYPERTENSION CONTROL Vitals:   03/21/23 0904 03/21/23 0936  BP: (!) 168/130 (!) 148/98    The patient's blood pressure is elevated above target today.  In order to address the patient's elevated BP: Blood pressure will be monitored at home to determine if medication changes need to be made.; A current anti-hypertensive medication was adjusted today.       ECG personally reviewed by me today-EKG Interpretation Date/Time:  Friday March 21 2023 09:10:51 EST Ventricular Rate:  70 PR Interval:  158 QRS Duration:  88 QT Interval:  500 QTC Calculation: 540 R Axis:   -8  Text Interpretation: Normal sinus rhythm Left ventricular hypertrophy ( R in aVL , Sokolow-Lyon , Cornell product ) ST & Marked T  wave abnormality, consider anterolateral ischemia Confirmed by Edd Fabian [phone]) on 03/21/2023 9:16:12 AM   EKG 01/09/2022 normal sinus rhythm T wave abnormality prolonged QT 75 bpm- No acute changes  Echocardiogram 12/13/2019 IMPRESSIONS     1. Limited study for wall motion   2. Left ventricular ejection fraction, by estimation, is 50 to 55%. The  left ventricle has low normal function. There is moderate hypokinesis of  the left ventricular, entire inferior wall and lateral wall. Findings  suggestive of LCx territory  ischemia/infarct.   Comparison(s): Changes from prior study are noted. 12/12/2019: LVEF 55-60%,  possible inferior/inferolateral WMA.   FINDINGS   Left Ventricle: Left ventricular ejection fraction, by estimation, is 50  to 55%. The left ventricle has low normal function. Moderate hypokinesis  of the left ventricular, entire inferior wall and lateral wall. Definity  contrast agent was given IV to  delineate the left ventricular endocardial borders.      LV Wall Scoring:  The anterior wall, entire lateral wall, and basal inferior segment are  hypokinetic. The entire septum, mid and distal inferior wall, apical  anterior  segment, and apex are normal. Findings suggestive of LCx territory  ischemia/infarct.   Zoila Shutter MD  Electronically signed by Zoila Shutter MD  Signature Date/Time: 12/13/2019/11:54:00 AM      Assessment & Plan   1. LVH-elevated blood pressure today.  Reports he has been out of his olmesartan for about a month.  Previous echocardiogram on 12/13/2019 showed an LVEF of 50-55%, moderate hypokinesis, and findings suggestive of circumflex territory ischemia/infarct.  Details above.  Again reviewed importance of good blood pressure control Continue amlodipine, carvedilol, hydralazine, olmesartan Repeat echocardiogram   Essential hypertension-BP today 148/98.  Has not been taking Olmesartan for about a month. I will refill and have him  maintain a bp log. Continue current medical therapy Heart healthy low-sodium diet-salty 6 root viewed Increase physical activity  Hyperlipidemia-LDL 158 on 01/09/22 Continue atorvastatin High-fiber diet Repeat fasting lipids and LFTs   Disposition: Follow up with Dr. Allyson Sabal or me in 1 month.  Thomasene Ripple. Mylee Falin NP-C     03/21/2023, 9:37 AM [facility] [address] Office (  [phone] Fax [phone]  Notice: This dictation was prepared with Dragon dictation along with smaller phrase technology. Any transcriptional errors that result from this process are unintentional and may not be corrected upon review.  I spent 14 minutes examining this patient, reviewing medications, and using patient centered shared decision making involving her cardiac care.  Prior to her visit I spent greater than 20 minutes reviewing her past medical history,  medications, and prior cardiac tests.

## 2023-03-21 ENCOUNTER — Ambulatory Visit: Payer: 59 | Attending: Cardiovascular Disease | Admitting: General Practice

## 2023-03-21 ENCOUNTER — Encounter: Payer: Self-pay | Admitting: General Practice

## 2023-03-21 VITALS — BP 148/98 | HR 70 | Ht 65.0 in | Wt 173.2 lb

## 2023-03-21 DIAGNOSIS — E782 Mixed hyperlipidemia: Secondary | ICD-10-CM | POA: Diagnosis not present

## 2023-03-21 DIAGNOSIS — I10 Essential (primary) hypertension: Secondary | ICD-10-CM

## 2023-03-21 DIAGNOSIS — I517 Cardiomegaly: Secondary | ICD-10-CM

## 2023-03-21 MED ORDER — ATORVASTATIN CALCIUM 40 MG PO TABS: 40.0000 mg | ORAL_TABLET | Freq: Every day | ORAL | 0 refills | Status: DC

## 2023-03-21 MED ORDER — OLMESARTAN MEDOXOMIL 20 MG PO TABS: 20.0000 mg | ORAL_TABLET | Freq: Every day | ORAL | 0 refills | Status: DC

## 2023-03-21 MED ORDER — AMLODIPINE BESYLATE 10 MG PO TABS: 10.0000 mg | ORAL_TABLET | Freq: Every day | ORAL | 0 refills | Status: DC

## 2023-03-21 MED ORDER — HYDRALAZINE HCL 50 MG PO TABS: 50.0000 mg | ORAL_TABLET | Freq: Two times a day (BID) | ORAL | 0 refills | Status: DC

## 2023-03-21 MED ORDER — CARVEDILOL 25 MG PO TABS: 25.0000 mg | ORAL_TABLET | Freq: Two times a day (BID) | ORAL | 0 refills | Status: DC

## 2023-03-21 NOTE — Patient Instructions (Signed)
Medication Instructions:  GO DIRECTLY TO THE PHARMACY AND WAIT FOR THE OLMESARTAN TAKE YOUR OLMESARTAN AS SOON AS YOU HAVE THIS FILLED  The current medical regimen is effective;  continue present plan and medications as directed. Please refer to the Current Medication list given to you today.  *If you need a refill on your cardiac medications before your next appointment, please call your pharmacy*  Lab Work: LIPID AND LFT TODAY If you have labs (blood work) drawn today and your tests are completely normal, you will receive your results only by: MyChart Message (if you have MyChart) OR A paper copy in the mail If you have any lab test that is abnormal or we need to change your treatment, we will call you to review the results.  Other Instructions TAKE AND LOG YOUR BLOOD PRESSURE CALL WITH BLOOD PRESSURE >130 OR IF YOU HAVE AN ISSUE WITH FILLING YOUR CARDIAC MEDICATIONS  Follow-Up: At [facility], you and your health needs are our priority.  As part of our continuing mission to provide you with exceptional heart care, we have created designated Provider Care Teams.  These Care Teams include your primary Cardiologist (physician) and Advanced Practice Providers (APPs -  Physician Assistants and Nurse Practitioners) who all work together to provide you with the care you need, when you need it.  Your next appointment:   1 month(s)  Provider:   Nanetta Batty, MD  or Edd Fabian, FNP

## 2023-03-28 ENCOUNTER — Ambulatory Visit: Payer: 59 | Admitting: Cardiovascular Disease

## 2023-04-05 ENCOUNTER — Other Ambulatory Visit: Payer: Self-pay | Admitting: General Practice

## 2023-04-12 ENCOUNTER — Other Ambulatory Visit: Payer: Self-pay | Admitting: General Practice

## 2023-04-12 DIAGNOSIS — I10 Essential (primary) hypertension: Secondary | ICD-10-CM

## 2023-04-25 ENCOUNTER — Ambulatory Visit: Payer: 59 | Admitting: General Practice

## 2023-05-04 ENCOUNTER — Other Ambulatory Visit: Payer: Self-pay | Admitting: General Practice

## 2023-05-04 DIAGNOSIS — E782 Mixed hyperlipidemia: Secondary | ICD-10-CM

## 2023-10-13 ENCOUNTER — Other Ambulatory Visit: Payer: Self-pay | Admitting: General Practice

## 2024-02-28 ENCOUNTER — Other Ambulatory Visit: Payer: Self-pay | Admitting: General Practice

## 2024-02-28 DIAGNOSIS — I10 Essential (primary) hypertension: Secondary | ICD-10-CM

## 2024-03-21 ENCOUNTER — Encounter (HOSPITAL_COMMUNITY): Payer: Self-pay | Admitting: Emergency Medicine

## 2024-03-21 ENCOUNTER — Emergency Department (HOSPITAL_COMMUNITY)

## 2024-03-21 ENCOUNTER — Other Ambulatory Visit: Payer: Self-pay

## 2024-03-21 ENCOUNTER — Inpatient Hospital Stay (HOSPITAL_COMMUNITY)
Admission: EM | Admit: 2024-03-21 | Discharge: 2024-03-27 | DRG: 280 | Disposition: A | Discharge status: Home / Self Care | Attending: Internal Medicine | Admitting: Internal Medicine

## 2024-03-21 DIAGNOSIS — I480 Paroxysmal atrial fibrillation: Secondary | ICD-10-CM | POA: Diagnosis present

## 2024-03-21 DIAGNOSIS — I43 Cardiomyopathy in diseases classified elsewhere: Secondary | ICD-10-CM | POA: Diagnosis present

## 2024-03-21 DIAGNOSIS — I21A1 Myocardial infarction type 2: Secondary | ICD-10-CM | POA: Diagnosis present

## 2024-03-21 DIAGNOSIS — E876 Hypokalemia: Secondary | ICD-10-CM | POA: Diagnosis present

## 2024-03-21 DIAGNOSIS — I161 Hypertensive emergency: Secondary | ICD-10-CM | POA: Diagnosis present

## 2024-03-21 DIAGNOSIS — I4892 Unspecified atrial flutter: Secondary | ICD-10-CM | POA: Diagnosis present

## 2024-03-21 DIAGNOSIS — R739 Hyperglycemia, unspecified: Secondary | ICD-10-CM | POA: Diagnosis present

## 2024-03-21 DIAGNOSIS — R7303 Prediabetes: Secondary | ICD-10-CM | POA: Diagnosis present

## 2024-03-21 DIAGNOSIS — Z79899 Other long term (current) drug therapy: Secondary | ICD-10-CM

## 2024-03-21 DIAGNOSIS — I513 Intracardiac thrombosis, not elsewhere classified: Secondary | ICD-10-CM | POA: Diagnosis present

## 2024-03-21 DIAGNOSIS — Z7901 Long term (current) use of anticoagulants: Secondary | ICD-10-CM

## 2024-03-21 DIAGNOSIS — R000 Tachycardia, unspecified: Secondary | ICD-10-CM | POA: Diagnosis present

## 2024-03-21 DIAGNOSIS — Z7984 Long term (current) use of oral hypoglycemic drugs: Secondary | ICD-10-CM | POA: Diagnosis not present

## 2024-03-21 DIAGNOSIS — D72829 Elevated white blood cell count, unspecified: Secondary | ICD-10-CM | POA: Diagnosis present

## 2024-03-21 DIAGNOSIS — I509 Heart failure, unspecified: Secondary | ICD-10-CM | POA: Diagnosis not present

## 2024-03-21 DIAGNOSIS — I110 Hypertensive heart disease with heart failure: Secondary | ICD-10-CM | POA: Diagnosis present

## 2024-03-21 DIAGNOSIS — I422 Other hypertrophic cardiomyopathy: Secondary | ICD-10-CM | POA: Diagnosis present

## 2024-03-21 DIAGNOSIS — Z1152 Encounter for screening for COVID-19: Secondary | ICD-10-CM

## 2024-03-21 DIAGNOSIS — I5023 Acute on chronic systolic (congestive) heart failure: Secondary | ICD-10-CM | POA: Diagnosis present

## 2024-03-21 DIAGNOSIS — I10 Essential (primary) hypertension: Secondary | ICD-10-CM | POA: Diagnosis present

## 2024-03-21 DIAGNOSIS — M7989 Other specified soft tissue disorders: Secondary | ICD-10-CM | POA: Diagnosis present

## 2024-03-21 DIAGNOSIS — R509 Fever, unspecified: Secondary | ICD-10-CM | POA: Diagnosis not present

## 2024-03-21 DIAGNOSIS — R609 Edema, unspecified: Secondary | ICD-10-CM | POA: Diagnosis present

## 2024-03-21 DIAGNOSIS — I1A0 Resistant hypertension: Secondary | ICD-10-CM | POA: Diagnosis present

## 2024-03-21 DIAGNOSIS — N179 Acute kidney failure, unspecified: Secondary | ICD-10-CM | POA: Diagnosis present

## 2024-03-21 DIAGNOSIS — K761 Chronic passive congestion of liver: Secondary | ICD-10-CM | POA: Diagnosis present

## 2024-03-21 DIAGNOSIS — I5021 Acute systolic (congestive) heart failure: Secondary | ICD-10-CM | POA: Diagnosis not present

## 2024-03-21 DIAGNOSIS — E785 Hyperlipidemia, unspecified: Secondary | ICD-10-CM | POA: Diagnosis present

## 2024-03-21 DIAGNOSIS — E782 Mixed hyperlipidemia: Secondary | ICD-10-CM | POA: Diagnosis not present

## 2024-03-21 DIAGNOSIS — I5043 Acute on chronic combined systolic (congestive) and diastolic (congestive) heart failure: Secondary | ICD-10-CM | POA: Diagnosis not present

## 2024-03-21 DIAGNOSIS — Z884 Allergy status to anesthetic agent status: Secondary | ICD-10-CM

## 2024-03-21 LAB — CREATININE, SERUM
Creatinine, Ser: 2.28 mg/dL — ABNORMAL HIGH (ref 0.61–1.24)
GFR, Estimated: 36 mL/min — ABNORMAL LOW

## 2024-03-21 LAB — COMPREHENSIVE METABOLIC PANEL WITH GFR
ALT: 90 U/L — ABNORMAL HIGH (ref 0–44)
AST: 65 U/L — ABNORMAL HIGH (ref 15–41)
Albumin: 3.6 g/dL (ref 3.5–5.0)
Alkaline Phosphatase: 117 U/L (ref 38–126)
Anion gap: 16 — ABNORMAL HIGH (ref 5–15)
BUN: 33 mg/dL — ABNORMAL HIGH (ref 6–20)
CO2: 21 mmol/L — ABNORMAL LOW (ref 22–32)
Calcium: 9.1 mg/dL (ref 8.9–10.3)
Chloride: 101 mmol/L (ref 98–111)
Creatinine, Ser: 2.46 mg/dL — ABNORMAL HIGH (ref 0.61–1.24)
GFR, Estimated: 33 mL/min — ABNORMAL LOW
Glucose, Bld: 145 mg/dL — ABNORMAL HIGH (ref 70–99)
Potassium: 3 mmol/L — ABNORMAL LOW (ref 3.5–5.1)
Sodium: 138 mmol/L (ref 135–145)
Total Bilirubin: 0.8 mg/dL (ref 0.0–1.2)
Total Protein: 6.1 g/dL — ABNORMAL LOW (ref 6.5–8.1)

## 2024-03-21 LAB — CBC
HCT: 43.4 % (ref 39.0–52.0)
HCT: 41.4 % (ref 39.0–52.0)
Hemoglobin: 14.1 g/dL (ref 13.0–17.0)
Hemoglobin: 13.7 g/dL (ref 13.0–17.0)
MCH: 29.4 pg (ref 26.0–34.0)
MCH: 29.4 pg (ref 26.0–34.0)
MCHC: 32.5 g/dL (ref 30.0–36.0)
MCHC: 33.1 g/dL (ref 30.0–36.0)
MCV: 90.6 fL (ref 80.0–100.0)
MCV: 88.8 fL (ref 80.0–100.0)
Platelets: 410 K/uL — ABNORMAL HIGH (ref 150–400)
Platelets: 395 K/uL (ref 150–400)
RBC: 4.79 MIL/uL (ref 4.22–5.81)
RBC: 4.66 MIL/uL (ref 4.22–5.81)
RDW: 14 % (ref 11.5–15.5)
RDW: 14 % (ref 11.5–15.5)
WBC: 8.2 K/uL (ref 4.0–10.5)
WBC: 9.6 K/uL (ref 4.0–10.5)
nRBC: 0 % (ref 0.0–0.2)
nRBC: 0 % (ref 0.0–0.2)

## 2024-03-21 LAB — TROPONIN T, HIGH SENSITIVITY
Troponin T High Sensitivity: 1411 ng/L (ref 0–19)
Troponin T High Sensitivity: 1286 ng/L (ref 0–19)

## 2024-03-21 LAB — URINE DRUG SCREEN
Amphetamines: NEGATIVE
Barbiturates: NEGATIVE
Benzodiazepines: NEGATIVE
Cocaine: NEGATIVE
Fentanyl: NEGATIVE
Methadone Scn, Ur: NEGATIVE
Opiates: NEGATIVE
Tetrahydrocannabinol: NEGATIVE

## 2024-03-21 LAB — PRO BRAIN NATRIURETIC PEPTIDE: Pro Brain Natriuretic Peptide: 21140 pg/mL — ABNORMAL HIGH

## 2024-03-21 MED ORDER — ASPIRIN 81 MG PO CHEW
324.0000 mg | CHEWABLE_TABLET | Freq: Once | ORAL | Status: AC
  Administered 2024-03-21: 324 mg via ORAL
  Filled 2024-03-21: qty 4

## 2024-03-21 MED ORDER — HYDRALAZINE HCL 25 MG PO TABS
25.0000 mg | ORAL_TABLET | Freq: Three times a day (TID) | ORAL | Status: DC
  Administered 2024-03-21 – 2024-03-23 (×5): 25 mg via ORAL
  Filled 2024-03-21 (×5): qty 1

## 2024-03-21 MED ORDER — HYDRALAZINE HCL 25 MG PO TABS
50.0000 mg | ORAL_TABLET | Freq: Once | ORAL | Status: AC
  Administered 2024-03-21: 50 mg via ORAL
  Filled 2024-03-21: qty 2

## 2024-03-21 MED ORDER — CARVEDILOL 12.5 MG PO TABS
25.0000 mg | ORAL_TABLET | Freq: Once | ORAL | Status: AC
  Administered 2024-03-21: 25 mg via ORAL
  Filled 2024-03-21: qty 2

## 2024-03-21 MED ORDER — OXYCODONE HCL 5 MG PO TABS
5.0000 mg | ORAL_TABLET | ORAL | Status: DC | PRN
  Administered 2024-03-22 – 2024-03-25 (×5): 5 mg via ORAL
  Filled 2024-03-21 (×5): qty 1

## 2024-03-21 MED ORDER — IRBESARTAN 150 MG PO TABS: 150.0000 mg | ORAL_TABLET | Freq: Every day | ORAL | Status: DC

## 2024-03-21 MED ORDER — HEPARIN (PORCINE) 25000 UT/250ML-% IV SOLN
1100.0000 [IU]/h | INTRAVENOUS | Status: DC
  Administered 2024-03-21: 1200 [IU]/h via INTRAVENOUS
  Administered 2024-03-22 – 2024-03-25 (×4): 1000 [IU]/h via INTRAVENOUS
  Administered 2024-03-26: 1100 [IU]/h via INTRAVENOUS
  Filled 2024-03-21 (×6): qty 250

## 2024-03-21 MED ORDER — ACETAMINOPHEN 650 MG RE SUPP: 650.0000 mg | Freq: Four times a day (QID) | RECTAL | Status: DC | PRN

## 2024-03-21 MED ORDER — ISOSORBIDE DINITRATE 10 MG PO TABS
10.0000 mg | ORAL_TABLET | Freq: Three times a day (TID) | ORAL | Status: DC
  Administered 2024-03-22 (×4): 10 mg via ORAL
  Filled 2024-03-21 (×4): qty 1

## 2024-03-21 MED ORDER — AMLODIPINE BESYLATE 5 MG PO TABS: 10.0000 mg | ORAL_TABLET | Freq: Every day | ORAL | Status: DC

## 2024-03-21 MED ORDER — POTASSIUM CHLORIDE CRYS ER 20 MEQ PO TBCR
60.0000 meq | EXTENDED_RELEASE_TABLET | Freq: Once | ORAL | Status: AC
  Administered 2024-03-21: 60 meq via ORAL
  Filled 2024-03-21: qty 3

## 2024-03-21 MED ORDER — ONDANSETRON HCL 4 MG/2ML IJ SOLN
4.0000 mg | Freq: Four times a day (QID) | INTRAMUSCULAR | Status: DC | PRN
  Administered 2024-03-24: 4 mg via INTRAVENOUS

## 2024-03-21 MED ORDER — CARVEDILOL 12.5 MG PO TABS: 25.0000 mg | ORAL_TABLET | Freq: Two times a day (BID) | ORAL | Status: DC

## 2024-03-21 MED ORDER — FUROSEMIDE 10 MG/ML IJ SOLN
80.0000 mg | Freq: Once | INTRAMUSCULAR | Status: AC
  Administered 2024-03-22: 80 mg via INTRAVENOUS
  Filled 2024-03-21: qty 8

## 2024-03-21 MED ORDER — ACETAMINOPHEN 325 MG PO TABS
650.0000 mg | ORAL_TABLET | Freq: Four times a day (QID) | ORAL | Status: DC | PRN
  Administered 2024-03-22 – 2024-03-25 (×3): 650 mg via ORAL
  Filled 2024-03-21 (×3): qty 2

## 2024-03-21 MED ORDER — POTASSIUM CHLORIDE 10 MEQ/100ML IV SOLN
10.0000 meq | INTRAVENOUS | Status: AC
  Administered 2024-03-21 – 2024-03-22 (×3): 10 meq via INTRAVENOUS
  Filled 2024-03-21 (×3): qty 100

## 2024-03-21 MED ORDER — ONDANSETRON HCL 4 MG PO TABS: 4.0000 mg | ORAL_TABLET | Freq: Four times a day (QID) | ORAL | Status: DC | PRN

## 2024-03-21 MED ORDER — FUROSEMIDE 10 MG/ML IJ SOLN
80.0000 mg | Freq: Once | INTRAMUSCULAR | Status: AC
  Administered 2024-03-21: 80 mg via INTRAVENOUS
  Filled 2024-03-21: qty 8

## 2024-03-21 MED ORDER — HEPARIN BOLUS VIA INFUSION
4500.0000 [IU] | Freq: Once | INTRAVENOUS | Status: AC
  Administered 2024-03-21: 4500 [IU] via INTRAVENOUS
  Filled 2024-03-21: qty 4500

## 2024-03-21 NOTE — ED Provider Notes (Signed)
 "  EMERGENCY DEPARTMENT AT [facility] Provider Note   CSN: [id number] Arrival date & time: 03/21/24  1635     Patient presents with: Hypertension and Leg Swelling   John Savage is a 43 y.o. male.   HPI 43 year old male presents with a chief complaint of ankle/leg swelling and high blood pressure.  He has been dealing with ankle and leg swelling for a couple weeks.  Progressively getting worse.  He is also been having some sharp chest pain intermittently for the past 4 days or so.  Some shortness of breath as well.  No current headache but has been having some blurry vision.  He has chronic hypertension and does not take his medicines every day.  He did take his Norvasc  today but has not yet taken his hydralazine  or Coreg .  He typically takes his olmesartan  at night and took it last night.  He went to urgent care for the leg swelling and due to his very high blood pressure they sent him to the ER.  Prior to Admission medications  Medication Sig Start Date End Date Taking? Authorizing Provider  acetaminophen  (TYLENOL ) 325 MG tablet Take 650 mg by mouth every 6 (six) hours as needed.    [provider]  amLODipine  (NORVASC ) 10 MG tablet Take 1 tablet (10 mg total) by mouth daily. 04/14/23   John Dorn PARAS, MD  amoxicillin  (AMOXIL ) 875 MG tablet Take 1 tablet (875 mg total) by mouth 2 (two) times daily. Patient not taking: Reported on 03/21/2023 06/20/21   Arloa Suzen RAMAN, NP  atorvastatin  (LIPITOR ) 40 MG tablet TAKE 1 TABLET BY MOUTH EVERY DAY 05/06/23   Emelia Josefa HERO, NP  carvedilol  (COREG ) 25 MG tablet TAKE 1 TABLET (25 MG TOTAL) BY MOUTH TWICE A DAY WITH MEALS 04/14/23   John Dorn PARAS, MD  hydrALAZINE  (APRESOLINE ) 50 MG tablet Take 1 tablet (50 mg total) by mouth in the morning and at bedtime. 10/14/23   John Dorn PARAS, MD  ibuprofen (ADVIL) 200 MG tablet Take 400 mg by mouth every 6 (six) hours as needed (pain/headache).    [provider]  olmesartan  (BENICAR ) 20 MG tablet TAKE 1 TABLET BY MOUTH EVERY DAY 03/03/24   John Dorn PARAS, MD  potassium chloride  SA (KLOR-CON  M20) 20 MEQ tablet Take 1 tablet (20 mEq total) by mouth daily for 4 days. 01/18/22 01/22/22  Emelia Josefa HERO, NP    Allergies: Novocain [procaine]    Review of Systems  Respiratory:  Positive for shortness of breath.   Cardiovascular:  Positive for chest pain and leg swelling.    Updated Vital Signs BP (!) 142/103   Pulse 94   Temp 98.1 F (36.7 C)   Resp (!) 21   Ht 5' 5 (1.651 m)   Wt 84.4 kg   SpO2 100%   BMI 30.95 kg/m   Physical Exam Vitals and nursing note reviewed.  Constitutional:      General: He is not in acute distress.    Appearance: He is well-developed. He is not ill-appearing or diaphoretic.  HENT:     Head: Normocephalic and atraumatic.  Cardiovascular:     Rate and Rhythm: Regular rhythm. Tachycardia present.     Heart sounds: Normal heart sounds.  Pulmonary:     Effort: Pulmonary effort is normal.     Breath sounds: Normal breath sounds. No wheezing.  Abdominal:     Palpations: Abdomen is soft.     Tenderness: There is  no abdominal tenderness.  Musculoskeletal:     Right lower leg: Edema present.     Left lower leg: Edema present.  Skin:    General: Skin is warm and dry.  Neurological:     Mental Status: He is alert.     (all labs ordered are listed, but only abnormal results are displayed) Labs Reviewed  CBC - Abnormal; Notable for the following components:      Result Value   Platelets 410 (*)    All other components within normal limits  PRO BRAIN NATRIURETIC PEPTIDE - Abnormal; Notable for the following components:   Pro Brain Natriuretic Peptide 21,140.0 (*)    All other components within normal limits  COMPREHENSIVE METABOLIC PANEL WITH GFR - Abnormal; Notable for the following components:   Potassium 3.0 (*)    CO2 21 (*)    Glucose, Bld 145 (*)    BUN 33 (*)    Creatinine, Ser 2.46 (*)     Total Protein 6.1 (*)    AST 65 (*)    ALT 90 (*)    GFR, Estimated 33 (*)    Anion gap 16 (*)    All other components within normal limits  TROPONIN T, HIGH SENSITIVITY - Abnormal; Notable for the following components:   Troponin T High Sensitivity 1,411 (*)    All other components within normal limits  TROPONIN T, HIGH SENSITIVITY    EKG: EKG Interpretation Date/Time:  Sunday March 21 2024 16:49:55 EST Ventricular Rate:  124 PR Interval:  166 QRS Duration:  82 QT Interval:  304 QTC Calculation: 436 R Axis:   0  Text Interpretation: Sinus tachycardia Minimal voltage criteria for LVH, may be normal variant ( Sokolow-Lyon ) Possible Anterior infarct , age undetermined diffuse nonspecific ST/T changes Confirmed by Freddi Hamilton [phone]) on 03/21/2024 5:42:41 PM  Radiology: DG Chest 2 View Result Date: 03/21/2024 CLINICAL DATA:  Shortness of breath EXAM: CHEST - 2 VIEW COMPARISON:  12/11/2019 FINDINGS: Cardiomegaly with mild central congestion. No focal opacity, pleural effusion or pneumothorax. IMPRESSION: Cardiomegaly with mild central congestion. Electronically Signed   By: Luke Bun M.D.   On: 03/21/2024 17:36     .Critical Care  Performed by: Freddi Hamilton, MD Authorized by: Freddi Hamilton, MD   Critical care provider statement:    Critical care time (minutes):  30   Critical care was necessary to treat or prevent imminent or life-threatening deterioration of the following conditions:  Cardiac failure   Critical care was time spent personally by me on the following activities:  Development of treatment plan with patient or surrogate, discussions with consultants, evaluation of patient's response to treatment, examination of patient, ordering and review of laboratory studies, ordering and review of radiographic studies, ordering and performing treatments and interventions, pulse oximetry, re-evaluation of patient's condition and review of old charts    Medications  Ordered in the ED  hydrALAZINE  (APRESOLINE ) tablet 50 mg (50 mg Oral Given 03/21/24 1820)  carvedilol  (COREG ) tablet 25 mg (25 mg Oral Given 03/21/24 1820)  aspirin  chewable tablet 324 mg (324 mg Oral Given 03/21/24 1842)  furosemide  (LASIX ) injection 80 mg (80 mg Intravenous Given 03/21/24 1950)  potassium chloride  SA (KLOR-CON  M) CR tablet 60 mEq (60 mEq Oral Given 03/21/24 1949)                                    Medical Decision Making  Amount and/or Complexity of Data Reviewed External Data Reviewed: notes. Labs: ordered.    Details: AKI, elevated troponin, very elevated BNP Radiology: ordered and independent interpretation performed.    Details: Cardiomegaly ECG/medicine tests: ordered and independent interpretation performed.    Details: Sinus tachycardia  Risk OTC drugs. Prescription drug management. Decision regarding hospitalization.   Patient presents with what appears to be new onset CHF.  He is not distressed but his vitals show significant hypertension and tachycardia.  However he has not yet taken his carvedilol  or hydralazine  today.  Just by giving him those orally his blood pressure and heart rate have dramatically improved.  I suspect he likely has new onset CHF.  Discussed with cardiology based on his significantly elevated troponin, though his chest pain is quite atypical.  Initially advised no heparin  but he did a quick bedside ultrasound which showed an LV thrombus and so we will start heparin .  Will start diuresis as well.  I think his AKI is from CHF and hypertension.  Discussed with Dr. Dena for admission to the medical service.     Final diagnoses:  Acute congestive heart failure, unspecified heart failure type [facility])    ED Discharge Orders     None          Freddi Hamilton, MD 03/21/24 2023  "

## 2024-03-21 NOTE — ED Triage Notes (Addendum)
 Pt sent by UC for eval of elevated BP (214/187 in triage, checked twice). Pt reports dizziness, blurred vision, and SOB plus swelling to bilateral feet. Foot pain rated 8/10. Pt ambulatory w/o assistance and no obvious neuro deficits noted. PMH HTN and pt states he is compliant with meds, last took BP medication earlier today.

## 2024-03-21 NOTE — ED Triage Notes (Addendum)
 Pt sent from urgent care, hypertensive crisis with bp of 220/170 and HR in the 124.

## 2024-03-21 NOTE — Consult Note (Addendum)
 "  Cardiology Consultation   Patient ID: John Savage MRN: [id number]; DOB: [birth year]  Admit date: 03/21/2024 Date of Consult: 03/21/2024  PCP:  Celestia Rosaline SQUIBB, NP   [facility] Providers Cardiologist:  [name], MD        Patient Profile: John Savage is a 43 y.o. male with a hx of HTN who is being seen 03/21/2024 for the evaluation of concern for Acute Decompensated Heart Failure at the request of Dr. Freddi (ED).  History of Present Illness: John Savage is a 43 year old male with a history notable for hypertension who presented to the emergency department due to progressive lower extremity edema.  Patient reports that over the past 2 weeks-he has had significant lower extremity swelling which is new.  During the same time span, he has also noticed dyspnea on exertion, abdominal bloating, orthopnea and paroxysmal nocturnal dyspnea.  He decided to come to the emergency department today given his lower extremity swelling had seemed to progress and was not improving at home.  Of note, his history is notable for significant poorly controlled hypertension-where he states his home blood pressure has run typically in the 170s to 200s systolic.  When questioned on his home medications-states he is compliant with amlodipine  10 mg daily, hydralazine  25 mg daily, and Coreg  25 mg twice daily.  Not taking any other medications.  Despite this regimen was still having issues with hypertension as noted above.  He states he was diagnosed with hypertension several years ago-but never had any medications until moving to [place]  about 2 years ago.  He has no substance use history, no tobacco use history, no familial history of heart disease or heart failure.  He is originally from [place]-but previously lived in [place] .   In the emergency department, he was noted to be significantly hypertensive with blood pressures in the 200s systolic on arrival, along  with sinus tachycardia on his ECG-heart rates in the 120s-otherwise hemodynamically stable saturating well on room air.  Labs notable for a creatinine of 2.46 potassium of 3, AST and ALT elevated at 65 and 90 respectively proBNP elevated at 21,000, high-sensitivity troponin elevated at 1400.  Chest x-ray with some perihilar congestion and cardiomegaly but no effusions.  He was given 80 of IV Lasix -in addition to his home medications which included Coreg , hydralazine  50 mg and a full dose aspirin  given the troponin elevation.  On my bedside evaluation-surface echocardiogram does demonstrate what appears to be a reduced left ventricular systolic ejection fraction-visually estimated at less than 35%.  Of note, does have a very large apical left ventricular masslike lesion-concerning for a left ventricular thrombus.   Past Medical History:  Diagnosis Date   Hypertension     History reviewed. No pertinent surgical history.   Home Medications:  Prior to Admission medications  Medication Sig Start Date End Date Taking? Authorizing Provider  acetaminophen  (TYLENOL ) 500 MG tablet Take 1,000 mg by mouth every 6 (six) hours as needed for moderate pain (pain score 4-6).   Yes [provider]  amLODipine  (NORVASC ) 10 MG tablet Take 1 tablet (10 mg total) by mouth daily. 04/14/23  Yes Lesches Dorn PARAS, MD  carvedilol  (COREG ) 25 MG tablet TAKE 1 TABLET (25 MG TOTAL) BY MOUTH TWICE A DAY WITH MEALS 04/14/23  Yes Lesches Dorn PARAS, MD  hydrALAZINE  (APRESOLINE ) 50 MG tablet Take 1 tablet (50 mg total) by mouth in the morning and at bedtime. 10/14/23  Yes Lesches Dorn PARAS, MD  olmesartan  (BENICAR )  20 MG tablet TAKE 1 TABLET BY MOUTH EVERY DAY 03/03/24  Yes Court Dorn PARAS, MD  ondansetron  (ZOFRAN -ODT) 4 MG disintegrating tablet Take 4 mg by mouth every 8 (eight) hours as needed. 02/27/24  Yes [provider]  amoxicillin  (AMOXIL ) 875 MG tablet Take 1 tablet (875 mg total) by mouth 2 (two) times  daily. Patient not taking: Reported on 03/21/2023 06/20/21   Arloa Suzen RAMAN, NP  atorvastatin  (LIPITOR ) 40 MG tablet TAKE 1 TABLET BY MOUTH EVERY DAY Patient not taking: Reported on 03/21/2024 05/06/23   Emelia Josefa HERO, NP  potassium chloride  SA (KLOR-CON  M20) 20 MEQ tablet Take 1 tablet (20 mEq total) by mouth daily for 4 days. Patient not taking: Reported on 03/21/2024 01/18/22 01/22/22  Emelia Josefa HERO, NP    Scheduled Meds:  heparin   4,500 Units Intravenous Once   Continuous Infusions:  heparin      PRN Meds:   Allergies:   Allergies[1]  Social History:   Social History   Socioeconomic History   Marital status: Married    Spouse name: Not on file   Number of children: Not on file   Years of education: Not on file   Highest education level: Not on file  Occupational History   Not on file  Tobacco Use   Smoking status: Never   Smokeless tobacco: Never  Vaping Use   Vaping status: Never Used  Substance and Sexual Activity   Alcohol use: No   Drug use: No   Sexual activity: Not on file  Other Topics Concern   Not on file  Social History Narrative   Right handed   Lives at home with wife and kids   Social Drivers of Health   Tobacco Use: Low Risk (03/21/2024)   Patient History    Smoking Tobacco Use: Never    Smokeless Tobacco Use: Never    Passive Exposure: Not on file  Financial Resource Strain: Not on file  Food Insecurity: Not on file  Transportation Needs: Not on file  Physical Activity: Not on file  Stress: Not on file  Social Connections: Not on file  Intimate Partner Violence: Not on file  Depression (EYV7-0): Not on file  Alcohol Screen: Not on file  Housing: Not on file  Utilities: Not on file  Health Literacy: Not on file    Family History:   Family History  Problem Relation Age of Onset   Healthy Mother    Healthy Father      ROS:  Please see the history of present illness.   All other ROS reviewed and negative.     Physical  Exam/Data: Vitals:   03/21/24 1645 03/21/24 1745 03/21/24 1820 03/21/24 1900  BP:  (!) 207/160 (!) 222/163 (!) 142/103  Pulse:  (!) 125 (!) 117 94  Resp:  (!) 35  (!) 21  Temp:      SpO2:  100%  100%  Weight: 84.4 kg     Height: 5' 5 (1.651 m)      No intake or output data in the 24 hours ending 03/21/24 2058    03/21/2024    4:45 PM 03/21/2023    9:04 AM 01/09/2022    8:49 AM  Last 3 Weights  Weight (lbs) 186 lb 173 lb 3.2 oz 176 lb 6.4 oz  Weight (kg) 84.369 kg 78.563 kg 80.015 kg     Body mass index is 30.95 kg/m.  General:  Well nourished, well developed, in no acute distress HEENT: normal  Neck: Jugular venous distention to the angle of the mandible Cardiac: Regular rate and rhythm no appreciable murmurs rubs or gallops, normal S1 and S2, no appreciable S3 Lungs:  clear to auscultation bilaterally, no wheezing, rhonchi or rales  Abd: Abdomen is soft though distended Ext: 2+ bilateral lower extremity edema Skin: Lateral lower extremities are lukewarm to touch bilaterally Neuro: Alert and conversant with spontaneous movement of all 4 extremities Psych:  Normal affect   EKG:  The EKG was personally reviewed and demonstrates: Sinus tachycardia, LVH, nonspecific inferior T wave abnormalities   Relevant CV Studies: Bedside POCUS revealing reduced EF (visually less than 35%) with a large LV mass/thrombus  Laboratory Data: High Sensitivity Troponin:  No results for input(s): TROPONINIHS in the last 720 hours.   Chemistry Recent Labs  Lab 03/21/24 1655  NA 138  K 3.0*  CL 101  CO2 21*  GLUCOSE 145*  BUN 33*  CREATININE 2.46*  CALCIUM  9.1  GFRNONAA 33*  ANIONGAP 16*    Recent Labs  Lab 03/21/24 1655  PROT 6.1*  ALBUMIN 3.6  AST 65*  ALT 90*  ALKPHOS 117  BILITOT 0.8   Lipids No results for input(s): CHOL, TRIG, HDL, LABVLDL, LDLCALC, CHOLHDL in the last 168 hours.  Hematology Recent Labs  Lab 03/21/24 1647  WBC 8.2  RBC 4.79  HGB 14.1   HCT 43.4  MCV 90.6  MCH 29.4  MCHC 32.5  RDW 14.0  PLT 410*   Thyroid No results for input(s): TSH, FREET4 in the last 168 hours.  BNP Recent Labs  Lab 03/21/24 1647  PROBNP 21,140.0*    DDimer No results for input(s): DDIMER in the last 168 hours.  Radiology/Studies:  DG Chest 2 View Result Date: 03/21/2024 CLINICAL DATA:  Shortness of breath EXAM: CHEST - 2 VIEW COMPARISON:  12/11/2019 FINDINGS: Cardiomegaly with mild central congestion. No focal opacity, pleural effusion or pneumothorax. IMPRESSION: Cardiomegaly with mild central congestion. Electronically Signed   By: Luke Bun M.D.   On: 03/21/2024 17:36     Assessment and Plan: Acute decompensated heart failure Etiology of his acute presentation is most certainly related to decompensated heart failure-that may be secondary to extremely poorly controlled hypertension-as his presenting blood pressures were greater than 200 and perhaps have been for quite some time.  Blood pressure has improved-though will need to be effectively managed with guideline directed medical therapy throughout his hospitalization.  No prior history of heart failure-no risk factors for ischemia.  No drug use history.  Plan: -Diuretic: Agree with Lasix  80 IV x 1, may need to be repeated in approximately 6 hours-though would hold for now in the setting of significant hypokalemia  -Guideline directed medical therapy: *BB: Please hold Coreg  for now in the setting of borderline low output *MRA: Can consider starting spironolactone  25 mg in the morning *SGLT2: Hold for now *ACE/ARB/ARNI: Hold home olmesartan  for now, acute kidney injury  -Afterload reduction: *Please start hydralazine  25 mg every 8 hours, this can be increased to 37.5 if needed *Please start Isordil  10 mg 3 times daily  Additional diagnostic workup: -May need ischemic workup, timing pending renal function -Formal surface echocardiogram in the morning -Please send urine  drug screen -Respiratory viral panel recommended  Additional advice: -Maintain potassium greater than 4, magnesium  greater than 2 -Would restrict free water to less than 2 L/day  Concern for LV thrombus As noted above, my bedside POCUS did reveal a fairly large apical LV thrombus.  He is alert and  conversant with spontaneous movement of all 4 extremities on my baseline evaluation.  Would start anticoagulation given the extent of this abnormality. -Please start heparin   Non-ACS associated troponin elevation, type II MI Suspect demand ischemia in the setting of acute decompensated heart failure. -Trend troponin to peak  Hypertension As above, would opt for afterload reduction with hydralazine  and Isordil  rather than continuation of his home medications.  His regimen will need to be optimized per guideline directed medical therapy.  See acute decompensated heart failure above. -Hold Coreg  -Hold olmesartan  -Hold amlodipine  10 mg daily -Start hydralazine  25 mg every 8 hours -Start Isordil  10 mg 3 times daily  Cardiorenal syndrome Diuresis per CHF above  Congestive hepatopathy Management per heart failure above  Hypokalemia Aggressive repletion, maintain K greater than 4  Risk Assessment/Risk Scores:       [place]  Heart Association (NYHA) Functional Class NYHA Class III     For questions or updates, please contact [facility] Please consult [email or web address] for contact info under    Signed, Dorn Kapur, MD  03/21/2024 8:58 PM     [1]  Allergies Allergen Reactions   Novocain [Procaine] Other (See Comments)    Unknown reaction   "

## 2024-03-21 NOTE — Progress Notes (Signed)
 ANTICOAGULATION CONSULT NOTE  Pharmacy Consult for Heparin  Indication: LV Thrombus  Allergies[1]  Patient Measurements: Height: 5' 5 (165.1 cm) Weight: 84.4 kg (186 lb) IBW/kg (Calculated) : 61.5 Heparin  Dosing Weight: 79.1 kg  Vital Signs: Temp: 98.1 F (36.7 C) (12/21 1641) BP: 142/103 (12/21 1900) Pulse Rate: 94 (12/21 1900)  Labs: Recent Labs    03/21/24 1647 03/21/24 1655  HGB 14.1  --   HCT 43.4  --   PLT 410*  --   CREATININE  --  2.46*    Estimated Creatinine Clearance: 39.1 mL/min (A) (by C-G formula based on SCr of 2.46 mg/dL (H)).   Medical History: Past Medical History:  Diagnosis Date   Hypertension     Medications:  (Not in a hospital admission)  Scheduled:  Infusions:  PRN:   Assessment: 32 yom presenting with ankle/leg swelling and high blood pressure. Bedside US  by cardiology w/ LV Thrombus. Heparin  per pharmacy consult placed for LV thrombus.  Patient is not on anticoagulation prior to arrival.  Hgb 14.1; plt 410 hsTrop 1411  Goal of Therapy:  Heparin  level 0.3-0.7 units/ml Monitor platelets by anticoagulation protocol: Yes   Plan:  Give IV heparin  4500 units bolus x 1 Start heparin  infusion at 1200 units/hr Check anti-Xa level in 8 hours and daily while on heparin  Continue to monitor H&H and platelets  Dorn Buttner, PharmD, BCPS 03/21/2024 8:31 PM ED Clinical Pharmacist -  [phone]       [1]  Allergies Allergen Reactions   Novocain [Procaine] Other (See Comments)    Unknown reaction

## 2024-03-21 NOTE — H&P (Incomplete)
 " History and Physical    John Savage FMW:[id number] DOB: 10-06-1980 DOA: 03/21/2024  PCP: John Rosaline SQUIBB, NP   Chief Complaint: Volume overload  HPI: John Savage is a 43 y.o. male with medical history significant of hypertension who presented to the emergency department due to concern for decompensated heart failure.  Patient's been having progressively worsening swelling over the last several weeks.  He has a history of poorly controlled hypertension with blood pressures typically up to 200.  He endorses compliance with his blood pressure medications and is still having issues.  He presented to the ER where he was found to be hypertensive and hemodynamically stable.  He was having intermittent chest discomfort and shortness of breath.  Labs were obtained on presentation which showed WBC 8.2, hemoglobin 14.1, troponin 1411, BNP 21,000, potassium 3.0, creatinine 2.4, AST 65, ALT 90.  Chest x-ray showed cardiomegaly with congestion.  Cardiology was consulted patient was admitted for further workup.  Echocardiogram was obtained which showed concern for LV thrombus.   Review of Systems: Review of Systems  Constitutional:  Negative for chills and fever.  HENT: Negative.    Eyes: Negative.   Respiratory: Negative.    Cardiovascular:  Positive for orthopnea.  Gastrointestinal: Negative.   Genitourinary: Negative.   Musculoskeletal: Negative.   Skin: Negative.   Neurological: Negative.   Endo/Heme/Allergies: Negative.   All other systems reviewed and are negative.    As per HPI otherwise 10 point review of systems negative.   Allergies[1]  Past Medical History:  Diagnosis Date   Hypertension     History reviewed. No pertinent surgical history.   reports that he has never smoked. He has never used smokeless tobacco. He reports that he does not drink alcohol and does not use drugs.  Family History  Problem Relation Age of Onset   Healthy Mother    Healthy  Father     Prior to Admission medications  Medication Sig Start Date End Date Taking? Authorizing Provider  acetaminophen  (TYLENOL ) 500 MG tablet Take 1,000 mg by mouth every 6 (six) hours as needed for moderate pain (pain score 4-6).   Yes [provider]  amLODipine  (NORVASC ) 10 MG tablet Take 1 tablet (10 mg total) by mouth daily. 04/14/23  Yes Court Dorn PARAS, MD  carvedilol  (COREG ) 25 MG tablet TAKE 1 TABLET (25 MG TOTAL) BY MOUTH TWICE A DAY WITH MEALS 04/14/23  Yes Court Dorn PARAS, MD  hydrALAZINE  (APRESOLINE ) 50 MG tablet Take 1 tablet (50 mg total) by mouth in the morning and at bedtime. 10/14/23  Yes Court Dorn PARAS, MD  olmesartan  (BENICAR ) 20 MG tablet TAKE 1 TABLET BY MOUTH EVERY DAY 03/03/24  Yes Court Dorn PARAS, MD  ondansetron  (ZOFRAN -ODT) 4 MG disintegrating tablet Take 4 mg by mouth every 8 (eight) hours as needed. 02/27/24  Yes [provider]  amoxicillin  (AMOXIL ) 875 MG tablet Take 1 tablet (875 mg total) by mouth 2 (two) times daily. Patient not taking: Reported on 03/21/2023 06/20/21   John Suzen RAMAN, NP  atorvastatin  (LIPITOR ) 40 MG tablet TAKE 1 TABLET BY MOUTH EVERY DAY Patient not taking: Reported on 03/21/2024 05/06/23   John Josefa HERO, NP  potassium chloride  SA (KLOR-CON  M20) 20 MEQ tablet Take 1 tablet (20 mEq total) by mouth daily for 4 days. Patient not taking: Reported on 03/21/2024 01/18/22 01/22/22  John Josefa HERO, NP    Physical Exam: Vitals:   03/21/24 2030 03/21/24 2100 03/21/24 2130 03/21/24 2200  BP: ROLLEN)  148/117  (!) 159/125 (!) 151/136  Pulse: 89  94 (!) 102  Resp: (!) 25  (!) 23 16  Temp:  97.8 F (36.6 C)    TempSrc:  Oral    SpO2: 100%  100% 100%  Weight:      Height:       Physical Exam Constitutional:      Appearance: He is normal weight.  HENT:     Head: Normocephalic.     Nose: Nose normal.     Mouth/Throat:     Mouth: Mucous membranes are moist.     Pharynx: Oropharynx is clear.  Eyes:      Extraocular Movements: Extraocular movements intact.     Conjunctiva/sclera: Conjunctivae normal.     Pupils: Pupils are equal, round, and reactive to light.  Cardiovascular:     Rate and Rhythm: Normal rate and regular rhythm.     Pulses: Normal pulses.     Heart sounds: Normal heart sounds.  Pulmonary:     Effort: Pulmonary effort is normal.     Breath sounds: Normal breath sounds.  Abdominal:     General: Abdomen is flat. Bowel sounds are normal.     Palpations: Abdomen is soft.  Musculoskeletal:        General: Normal range of motion.     Cervical back: Normal range of motion.  Skin:    General: Skin is warm.     Capillary Refill: Capillary refill takes less than 2 seconds.  Neurological:     General: No focal deficit present.     Mental Status: He is alert.  Psychiatric:        Mood and Affect: Mood normal.        Labs on Admission: I have personally reviewed the patients's labs and imaging studies.  Assessment/Plan Principal Problem:   CHF (congestive heart failure) (HCC)   # Acute CHF exacerbation #LV thrombus -last echocardiogram 2021 showed EF 50-55% - Patient's been having progressively worsening shortness of breath -Cardiology consulted  Plan: Continue IV diuresis Hold carvedilol  Start hydralazine , Isordil  Obtain echocardiogram, UDS, respiratory viral panel Continue hep gtt  #HTN- hold coreg , olmesartan , amlodipine , start hydral, isordil  per card recs  #Hypokalemia- replete K  #Aki- likely cardiorenal, continue diuresis  Admission status: Inpatient Telemetry  Certification: The appropriate patient status for this patient is INPATIENT. Inpatient status is judged to be reasonable and necessary in order to provide the required intensity of service to ensure the patient's safety. The patient's presenting symptoms, physical exam findings, and initial radiographic and laboratory data in the context of their chronic comorbidities is felt to place them at  high risk for further clinical deterioration. Furthermore, it is not anticipated that the patient will be medically stable for discharge from the hospital within 2 midnights of admission.   * I certify that at the point of admission it is my clinical judgment that the patient will require inpatient hospital care spanning beyond 2 midnights from the point of admission due to high intensity of service, high risk for further deterioration and high frequency of surveillance required.DEWAINE Lamar Dess MD [facility] If 7PM-7AM, please contact night-coverage [email or web address]  03/21/2024, 10:20 PM        [1]  Allergies Allergen Reactions   Novocain [Procaine] Other (See Comments)    Unknown reaction   Porcine (Pork) Protein-Containing Drug Products Other (See Comments)    Religious reason   "

## 2024-03-21 NOTE — Progress Notes (Signed)
 "  [facility]  Urgent Care Provider Note   Provider at bedside: 3:07 PM  History obtained from the: Patient  HISTORY   PATIENT ID: John Savage is a 43 y.o. male.  CHIEF COMPLAINT: Chief Complaint  Patient presents with   Foot Swelling    Swelling on both feet and no appetite.     ALLERGIES: Allergies[1]   PAST MEDICAL HISTORY: PMH - Hypertension  CURRENT MEDICATIONS: Current Medications[2]  ROS  All other symptoms are reviewed and are negative except those listed in HPI   HPI   John Savage is a 43 y.o. male  presents to Urgent care   History of Present Illness The patient presents for evaluation of ankle swelling bilateral.  Ankle Swelling - Experiencing ankle swelling for 2 weeks. - No fevers.  Hypertension - History of hypertension managed with amlodipine  and Cardizem. - Blood pressure remains uncontrolled, systolic 174-200. - Regular follow-ups.  Shortness of Breath - Reports shortness of breath starting last week.  Lack of Appetite and Nausea - Lack of appetite. - Nausea.  Chest Pain - Experienced chest pain Thursday night after eating yam. - No current chest pain.  PHYSICAL EXAM   Vitals:   03/21/24 1503  BP: (!) 221/172  BP Location: Left arm  Patient Position: Sitting  Pulse: (!) 124  Resp: 17  Temp: 98.4 F (36.9 C)  TempSrc: Oral  SpO2: 100%  Weight: 84.4 kg (186 lb)  Height: 1.651 m (5' 5)     Physical Exam Vitals and nursing note reviewed.  Constitutional:      General: He is not in acute distress.    Appearance: Normal appearance. He is not ill-appearing, toxic-appearing or diaphoretic.  HENT:     Head: Normocephalic and atraumatic.     Right Ear: External ear normal.     Left Ear: External ear normal.     Nose: Nose normal.     Mouth/Throat:     Mouth: Mucous membranes are moist.  Cardiovascular:     Rate and Rhythm: Normal rate.  Pulmonary:     Effort: Pulmonary effort  is normal. No respiratory distress.     Breath sounds: No wheezing or rhonchi.  Musculoskeletal:     Right lower leg: Edema present.     Left lower leg: Edema present.  Skin:    General: Skin is warm and dry.     Capillary Refill: Capillary refill takes less than 2 seconds.     Coloration: Skin is not pale.  Neurological:     General: No focal deficit present.     Mental Status: He is alert and oriented to person, place, and time.     Motor: No weakness.        RESULTS  No results found for this visit on 03/21/24.   ASSESSMENT/PLAN/MDM   1. Bilateral lower extremity edema   2. Tachycardia   3. Hypertensive crisis      John Savage is a 43 y.o. male  presents to Urgent care for shortness of breath, uncontrolled blood pressure and swelling bilateral lower extremities despite being on 4 blood pressure medications.  Exam patient is tachycardic at 124 blood pressure is elevated at 221/172.  He is afebrile.  Bilateral lower extremity swelling is noted 1+ pitting equal bilateral.  No respiratory distress is noted.  Lungs are clear bilateral.  Oxygen saturation is at 100% on room air.  No neurologic deficits are identified.  Patient is encouraged to go to ER for higher level of care and further diagnostics as I could not rule out exacerbation of CHF, PE or better control his blood pressure without further diagnostics.  Patient states understanding and states he will go to [facility], ER now.  He is agreeable with this plan      UC DISPOSITION   Follow up with PCP  Patient Instructions  You will need further testing in the emergency room given how high your blood pressure is and how high your heart rate is with your heart rate which is also very fast.  I could not rule out a saddle emboli or blood clot or exacerbation of congestive heart failure given your shortness of breath.  Go to the emergency room now for further evaluation higher level of care.   Hand out provided, I  discussed the findings today, diagnosis/differential diagnosis, plan and red flags that require return for reevaluation with PCP,  Urgent care or EMERGENCY. Patient/representitive was agreeable to outlined plan. Questions were answered and patient is stable for discharge.  Provider time spent in patient care today, inclusive of but not limited to clinical reassessment, review of diagnostic studies, and discharge preparation, was less than 30 minutes.  This document was created using the aid of voice recognition Scientist, clinical (histocompatibility and immunogenetics).  Electrically signed by Channing Sero ENP-C MSN at 3:39 PM        [1] Allergies Allergen Reactions   Procaine Other (See Comments)    Unknown reaction  [2]   amLODIPine  (NORVASC ) 10 mg tablet   atorvastatin  (LIPITOR ) 40 mg tablet   carvediloL  (COREG ) 25 mg tablet   hydrALAZINE  (APRESOLINE ) 50 mg tablet   olmesartan  (BENICAR ) 20 mg tablet "

## 2024-03-21 NOTE — ED Provider Triage Note (Signed)
 Emergency Medicine Provider Triage Evaluation Note  John Savage , a 43 y.o. male  was evaluated in triage.  Pt complains of leg swelling. Pt sts for the past 2 weeks he has had increased sob as well as leg swelling.  Endorse occasional headache and blurry vision but not currently.  No other changes.  No cp, n/v/d, focal numbness or focal weakness  Review of Systems  Positive: As above Negative: As above  Physical Exam  BP (!) 214/187 (BP Location: Right Arm)   Pulse 65   Temp 98.1 F (36.7 C)   Resp 17   Ht 5' 5 (1.651 m)   Wt 84.4 kg   SpO2 100%   BMI 30.95 kg/m  Gen:   Awake, no distress   Resp:  Normal effort  MSK:   Moves extremities without difficulty  Other:  Peripheral edema bilaterally  Medical Decision Making  Medically screening exam initiated at 4:55 PM.  Appropriate orders placed.  [name] was informed that the remainder of the evaluation will be completed by another provider, this initial triage assessment does not replace that evaluation, and the importance of remaining in the ED until their evaluation is complete.     Nivia Colon, PA-C 03/21/24 [phone]

## 2024-03-22 ENCOUNTER — Inpatient Hospital Stay (HOSPITAL_COMMUNITY)

## 2024-03-22 ENCOUNTER — Telehealth (HOSPITAL_COMMUNITY): Payer: Self-pay | Admitting: Pharmacy Technician

## 2024-03-22 ENCOUNTER — Other Ambulatory Visit (HOSPITAL_COMMUNITY): Payer: Self-pay

## 2024-03-22 DIAGNOSIS — I5021 Acute systolic (congestive) heart failure: Secondary | ICD-10-CM

## 2024-03-22 DIAGNOSIS — I5043 Acute on chronic combined systolic (congestive) and diastolic (congestive) heart failure: Secondary | ICD-10-CM

## 2024-03-22 LAB — BASIC METABOLIC PANEL WITH GFR
Anion gap: 15 (ref 5–15)
BUN: 34 mg/dL — ABNORMAL HIGH (ref 6–20)
CO2: 25 mmol/L (ref 22–32)
Calcium: 9 mg/dL (ref 8.9–10.3)
Chloride: 103 mmol/L (ref 98–111)
Creatinine, Ser: 2.25 mg/dL — ABNORMAL HIGH (ref 0.61–1.24)
GFR, Estimated: 36 mL/min — ABNORMAL LOW
Glucose, Bld: 170 mg/dL — ABNORMAL HIGH (ref 70–99)
Potassium: 3.6 mmol/L (ref 3.5–5.1)
Sodium: 143 mmol/L (ref 135–145)

## 2024-03-22 LAB — ECHOCARDIOGRAM COMPLETE
AR max vel: 1.93 cm2
AV Area VTI: 1.87 cm2
AV Area mean vel: 1.98 cm2
AV Mean grad: 2 mmHg
AV Peak grad: 3.9 mmHg
Ao pk vel: 0.98 m/s
Area-P 1/2: 9.6 cm2
Calc EF: 18.2 %
Est EF: <20
Height: 65 in
S' Lateral: 3.7 cm
Single Plane A2C EF: 16.7 %
Single Plane A4C EF: 17.7 %
Weight: 2578.5 [oz_av]

## 2024-03-22 LAB — CBC
HCT: 41.4 % (ref 39.0–52.0)
Hemoglobin: 13.7 g/dL (ref 13.0–17.0)
MCH: 29.6 pg (ref 26.0–34.0)
MCHC: 33.1 g/dL (ref 30.0–36.0)
MCV: 89.4 fL (ref 80.0–100.0)
Platelets: 379 K/uL (ref 150–400)
RBC: 4.63 MIL/uL (ref 4.22–5.81)
RDW: 14.3 % (ref 11.5–15.5)
WBC: 9.1 K/uL (ref 4.0–10.5)
nRBC: 0 % (ref 0.0–0.2)

## 2024-03-22 LAB — RESPIRATORY PANEL BY PCR

## 2024-03-22 LAB — PROTIME-INR
INR: 1.2 (ref 0.8–1.2)
Prothrombin Time: 15.6 s — ABNORMAL HIGH (ref 11.4–15.2)

## 2024-03-22 LAB — MAGNESIUM: Magnesium: 1.7 mg/dL (ref 1.7–2.4)

## 2024-03-22 LAB — I-STAT CG4 LACTIC ACID, ED: Lactic Acid, Venous: 1.7 mmol/L (ref 0.5–1.9)

## 2024-03-22 LAB — HEPARIN LEVEL (UNFRACTIONATED)
Heparin Unfractionated: 0.8 [IU]/mL — ABNORMAL HIGH (ref 0.30–0.70)
Heparin Unfractionated: 0.57 [IU]/mL (ref 0.30–0.70)

## 2024-03-22 MED ORDER — FUROSEMIDE 10 MG/ML IJ SOLN
80.0000 mg | Freq: Once | INTRAMUSCULAR | Status: AC
  Administered 2024-03-22: 80 mg via INTRAVENOUS
  Filled 2024-03-22: qty 8

## 2024-03-22 MED ORDER — MAGNESIUM SULFATE 4 GM/100ML IV SOLN
4.0000 g | Freq: Once | INTRAVENOUS | Status: AC
  Administered 2024-03-22: 4 g via INTRAVENOUS
  Filled 2024-03-22: qty 100

## 2024-03-22 MED ORDER — POTASSIUM CHLORIDE CRYS ER 20 MEQ PO TBCR
40.0000 meq | EXTENDED_RELEASE_TABLET | Freq: Once | ORAL | Status: AC
  Administered 2024-03-22: 40 meq via ORAL
  Filled 2024-03-22: qty 2

## 2024-03-22 MED ORDER — HYDRALAZINE HCL 20 MG/ML IJ SOLN
10.0000 mg | INTRAMUSCULAR | Status: DC | PRN
  Administered 2024-03-22 – 2024-03-23 (×3): 10 mg via INTRAVENOUS
  Filled 2024-03-22 (×3): qty 1

## 2024-03-22 MED ORDER — FUROSEMIDE 10 MG/ML IJ SOLN
80.0000 mg | Freq: Two times a day (BID) | INTRAMUSCULAR | Status: DC
  Administered 2024-03-22 – 2024-03-23 (×2): 80 mg via INTRAVENOUS
  Filled 2024-03-22 (×2): qty 8

## 2024-03-22 NOTE — Progress Notes (Signed)
 ANTICOAGULATION CONSULT NOTE  Pharmacy Consult for Heparin  Indication: LV Thrombus  Allergies[1]  Patient Measurements: Height: 5' 5 (165.1 cm) Weight: 73.1 kg (161 lb 2.5 oz) IBW/kg (Calculated) : 61.5 Heparin  Dosing Weight: 79.1 kg  Vital Signs: Temp: 98.1 F (36.7 C) (12/22 1210) Temp Source: Oral (12/22 1210) BP: 136/105 (12/22 1210) Pulse Rate: 98 (12/22 1210)  Labs: Recent Labs    03/21/24 1647 03/21/24 1655 03/21/24 2228 03/22/24 0500 03/22/24 1232  HGB 14.1  --  13.7 13.7  --   HCT 43.4  --  41.4 41.4  --   PLT 410*  --  395 379  --   LABPROT  --   --   --  15.6*  --   INR  --   --   --  1.2  --   HEPARINUNFRC  --   --   --  0.80* 0.57  CREATININE  --  2.46* 2.28* 2.25*  --     Estimated Creatinine Clearance: 37.2 mL/min (A) (by C-G formula based on SCr of 2.25 mg/dL (H)).   Medical History: Past Medical History:  Diagnosis Date   Hypertension    Assessment: 2 yom presenting with ankle/leg swelling and high blood pressure. Bedside US  by cardiology w/ LV Thrombus. Heparin  per pharmacy consult placed for LV thrombus. Patient is not on anticoagulation prior to arrival.  Heparin  level 0.57 is therapeutic with heparin  running at 1000 units/hr. Hgb (13.7) and PLTs (379) are stable. Per RN, no report of pauses, issues with the line, or signs of bleeding.   Goal of Therapy:  Heparin  level 0.3-0.7 units/ml Monitor platelets by anticoagulation protocol: Yes   Plan:  Continue heparin  infusion at 1000 units/hr Check anti-Xa level in tomorrow with AM labs and daily while on heparin  Continue to monitor H&H and platelets F/u plans for transitioning to oral anticoagulation  Thank you for allowing pharmacy to be a part of this patients care.   Nidia Schaffer, PharmD PGY2 Cardiology Pharmacy Resident  Please check AMION for all [facility] Pharmacy phone numbers After 10:00 PM, call Main Pharmacy [phone] 03/22/2024 1:15 PM    [1]  Allergies Allergen Reactions    Novocain [Procaine] Other (See Comments)    Unknown reaction   Porcine (Pork) Protein-Containing Drug Products Other (See Comments)    Religious reason

## 2024-03-22 NOTE — Progress Notes (Signed)
 ANTICOAGULATION CONSULT NOTE  Pharmacy Consult for Heparin  Indication: LV Thrombus  Allergies[1]  Patient Measurements: Height: 5' 5 (165.1 cm) Weight: 84.4 kg (186 lb) IBW/kg (Calculated) : 61.5 Heparin  Dosing Weight: 79.1 kg  Vital Signs: Temp: 97.8 F (36.6 C) (12/22 0459) Temp Source: Oral (12/22 0459) BP: 172/124 (12/22 0506) Pulse Rate: 79 (12/22 0459)  Labs: Recent Labs    03/21/24 1647 03/21/24 1655 03/21/24 2228 03/22/24 0500  HGB 14.1  --  13.7 13.7  HCT 43.4  --  41.4 41.4  PLT 410*  --  395 379  LABPROT  --   --   --  15.6*  INR  --   --   --  1.2  HEPARINUNFRC  --   --   --  0.80*  CREATININE  --  2.46* 2.28* 2.25*    Estimated Creatinine Clearance: 42.8 mL/min (A) (by C-G formula based on SCr of 2.25 mg/dL (H)).   Medical History: Past Medical History:  Diagnosis Date   Hypertension    Assessment: 43 yom presenting with ankle/leg swelling and high blood pressure. Bedside US  by cardiology w/ LV Thrombus. Heparin  per pharmacy consult placed for LV thrombus. Patient is not on anticoagulation prior to arrival. Hgb 14.1; plt 41  AM: heparin  level supra-therapeutic. Per RN, no signs/symptoms of bleeding. CBC stable  Goal of Therapy:  Heparin  level 0.3-0.7 units/ml Monitor platelets by anticoagulation protocol: Yes   Plan:  Decrease heparin  infusion to 1000 units/hr Check anti-Xa level in 6 hours and daily while on heparin  Continue to monitor H&H and platelets  Lynwood Poplar, PharmD, BCPS Clinical Pharmacist 03/22/2024 6:03 AM      [1]  Allergies Allergen Reactions   Novocain [Procaine] Other (See Comments)    Unknown reaction   Porcine (Pork) Protein-Containing Drug Products Other (See Comments)    Religious reason

## 2024-03-22 NOTE — TOC CM/SW Note (Addendum)
 Transition of Care [facility]) - Inpatient Brief Assessment   Patient Details  Name: John Savage MRN: [id number] Date of Birth: Aug 27, 1980  Transition of Care [facility]) CM/SW Contact:    Waddell Barnie Rama, RN Phone Number: 03/22/2024, 3:03 PM   Clinical Narrative: From home with spouse, has no PCP and insurance on file, states has no HH services in place at this time or DME at home.  States he will  transport himself home at [employer] and family is support system, states gets medications from [facility] on [address].  Pta self ambulatory.   There are no ICM needs identified  at this time.  Please place consult for ICM needs.     Transition of Care Asessment: Insurance and Status: Insurance coverage has been reviewed Patient has primary care physician: Yes Home environment has been reviewed: home with family Prior level of function:: indep Prior/Current Home Services: No current home services Social Drivers of Health Review: SDOH reviewed no interventions necessary Readmission risk has been reviewed: Yes Transition of care needs: no transition of care needs at this time

## 2024-03-22 NOTE — Progress Notes (Addendum)
 "  Progress Note  Patient Name: John Savage Date of Encounter: 03/22/2024 [facility] Cardiologist: John Lesches, MD   Interval Summary    Reports his breathing has improved, no chest pain.   Vital Signs Vitals:   03/22/24 0330 03/22/24 0459 03/22/24 0506 03/22/24 0600  BP: (!) 171/138  (!) 172/124 (!) 157/114  Pulse: 87 79  86  Resp: 18 13  (!) 22  Temp:  97.8 F (36.6 C)    TempSrc:  Oral    SpO2: 100% 100%  97%  Weight:      Height:        Intake/Output Summary (Last 24 hours) at 03/22/2024 0756 Last data filed at 03/22/2024 9388 Gross per 24 hour  Intake 432.34 ml  Output 3225 ml  Net -2792.66 ml      03/21/2024    4:45 PM 03/21/2023    9:04 AM 01/09/2022    8:49 AM  Last 3 Weights  Weight (lbs) 186 lb 173 lb 3.2 oz 176 lb 6.4 oz  Weight (kg) 84.369 kg 78.563 kg 80.015 kg      Telemetry/ECG   Sinus Rhythm, tachycardic at times - Personally Reviewed  Physical Exam  GEN: No acute distress.   Neck: + JVD Cardiac: RRR, no murmurs, rubs, or gallops.  Respiratory: Clear to auscultation bilaterally. GI: Soft, nontender, ++ distension  MS: 2+ Bilateral LE edema to knees   Assessment & Plan   43 yo male with PMH of HTN who presented to the ED with shortness of breath over the past 3 weeks. Found to be in CHF with LV thrombus.  Acute decompensated HFrEF -- Presented to the ED with complaints of worsening shortness of breath, lower extremity edema, orthopnea and PND for the past 3 weeks.  Etiology unclear but clearly has poorly controlled hypertension with a systolic pressure in the 200s on admission.  Does report 1 episode of right sided chest pain last week after eating a meal -- proBNP 21,140.  Chest x-ray with cardiomegaly and central congestion -- Markedly volume overloaded on exam with distended abdomen, 2-3+ lower extremity edema and JVD. -- Echocardiogram pending -- Warm and dry on exam, but will check lactic  -- Avoid beta-blocker  with acutely decompensated heart failure -- ideally, will need ischemic evaluation with R/LHC but would need to see his renal function improve -- Continue IV Lasix  80mg  BID, hydralazine  25 mg 3 times daily, Isordil  10 mg 3 times daily  LV thrombus -- Per overnight MD bedside POCUS with large apical LV thrombus noted -- Formal echo pending -- Currently on IV heparin   NSTEMI -- possible type II in the setting of acutely decompensated heart failure as well as hypertension -- Currently on IV heparin , as above ideally undergo ischemic evaluation hopefully once renal function improves -- Echo pending  AKI -- baseline Cr around 1.1, elevated at 2.46 on admission -- follow   Hypertension -- Systolic in the 200s on admission -- Improved with addition of hydralazine  and Isordil   Elevated LFTs -- Suspect secondary to CHF/congestion  Hypokalemia -- Supplement, check mag  For questions or updates, please contact [facility] Please consult [email or web address] for contact info under   Signed, John Rummer, NP    Agree with note by John Savage  Patient admitted with increasing shortness of breath which she has had for the last 2 weeks.  Also lower extremity edema.  His only past medical history is notable for hypertension.  He was hypertensive when  he arrived.  His BNP is elevated to 21,000 and his troponins are elevated as well probably related to type II MI.  He does have CKD with a serum creatinine is increased from 1.1 at baseline to 2.46.  On exam his lungs are clear.  He does have a summation gallop.  He has 2-3+ pitting edema peripherally.  Apparently a point of care bedside ultrasound showed LV thrombus, 2D echo is pending.  He is on IV heparin  and BiDil.  Blood pressures are somewhat improved.  He is put out 2.7 L to IV diuresis.  Agree that once he becomes euvolemic, depending on his renal function, he will need right left heart cath to define the etiology although I  suspect this is hypertensive.  Hold on beta-blockers for now until he is more compensated/further diuresed.  John Savage, M.D., FACP, [facility], [name] [address] [place], [place]  [address]  [phone] 03/22/2024 9:30 AM  "

## 2024-03-22 NOTE — Progress Notes (Addendum)
 " PROGRESS NOTE    John Savage  FMW:[id number] DOB: 21-Sep-1980 DOA: 03/21/2024 PCP: Celestia Rosaline SQUIBB, NP  43/M with history of hypertension presented to the ED with progressive swelling, shortness of breath and elevated BP.  In the ER he was hypertensive, hemoglobin 14, troponin 1411, BNP 21K, creatinine 2.4, chest x-ray with cardiomegaly and pulmonary vascular congestion. - Bedside POCUS concerning for low EF and LV thrombus   Subjective: Feels better, breathing is improving  Assessment and Plan:  Acute CHF, new diagnosis -Likely hypertensive cardiomyopathy - Bedside surface echo with concern for reduced EF, around 35% with large LV thrombus - Await formal echo - BP improving, continue IV Lasix  today, Imdur  and hydralazine  - Cards following - GDMT limited by AKI at this time  Concern for LV thrombus - Continue IV heparin , follow-up formal echo  Elevated troponin - Likely due to demand ischemia, follow-up echo findings, wall motion etc. - IV heparin  as above for LV thrombus  Uncontrolled hypertension - Now improving with diuresis, Coreg  and olmesartan  on hold - Diuretics, Imdur -hydralazine   Acute kidney injury -Creatinine 2.5 on admission, baseline 1.1 from 2 years ago - Likely cardiorenal - Holding ARB, diuretics as above  Congestive hepatopathy  Hyperglycemia - No history of diabetes, check HbA1c   DVT prophylaxis: IV heparin  Code Status: Full code Family Communication: None present Disposition Plan: Home pending above workup  Consultants: Cards   Procedures:   Antimicrobials:    Objective: Vitals:   03/22/24 0506 03/22/24 0600 03/22/24 0807 03/22/24 0926  BP: (!) 172/124 (!) 157/114  (!) 151/105  Pulse:  86  (!) 103  Resp:  (!) 22  (!) 22  Temp:      TempSrc:      SpO2:  97% 100% 100%  Weight:      Height:        Intake/Output Summary (Last 24 hours) at 03/22/2024 0928 Last data filed at 03/22/2024 9388 Gross per 24 hour  Intake  432.34 ml  Output 3225 ml  Net -2792.66 ml   Filed Weights   03/21/24 1645  Weight: 84.4 kg    Examination:  General exam: Appears calm and comfortable, AO x 3 HEENT: JVD + Respiratory system: Few basilar rales Cardiovascular system: S1 & S2 heard, RRR.  Abd: nondistended, soft and nontender.Normal bowel sounds heard. Central nervous system: Alert and oriented. No focal neurological deficits. Extremities: 2+ edema Skin: No rashes Psychiatry:  Mood & affect appropriate.     Data Reviewed:   CBC: Recent Labs  Lab 03/21/24 1647 03/21/24 2228 03/22/24 0500  WBC 8.2 9.6 9.1  HGB 14.1 13.7 13.7  HCT 43.4 41.4 41.4  MCV 90.6 88.8 89.4  PLT 410* 395 379   Basic Metabolic Panel: Recent Labs  Lab 03/21/24 1655 03/21/24 2228 03/22/24 0500  NA 138  --  143  K 3.0*  --  3.6  CL 101  --  103  CO2 21*  --  25  GLUCOSE 145*  --  170*  BUN 33*  --  34*  CREATININE 2.46* 2.28* 2.25*  CALCIUM  9.1  --  9.0   GFR: Estimated Creatinine Clearance: 42.8 mL/min (A) (by C-G formula based on SCr of 2.25 mg/dL (H)). Liver Function Tests: Recent Labs  Lab 03/21/24 1655  AST 65*  ALT 90*  ALKPHOS 117  BILITOT 0.8  PROT 6.1*  ALBUMIN 3.6   No results for input(s): LIPASE, AMYLASE in the last 168 hours. No results for input(s): AMMONIA in the  last 168 hours. Coagulation Profile: Recent Labs  Lab 03/22/24 0500  INR 1.2   Cardiac Enzymes: No results for input(s): CKTOTAL, CKMB, CKMBINDEX, TROPONINI in the last 168 hours. BNP (last 3 results) Recent Labs    03/21/24 1647  PROBNP 21,140.0*   HbA1C: No results for input(s): HGBA1C in the last 72 hours. CBG: No results for input(s): GLUCAP in the last 168 hours. Lipid Profile: No results for input(s): CHOL, HDL, LDLCALC, TRIG, CHOLHDL, LDLDIRECT in the last 72 hours. Thyroid Function Tests: No results for input(s): TSH, T4TOTAL, FREET4, T3FREE, THYROIDAB in the last 72  hours. Anemia Panel: No results for input(s): VITAMINB12, FOLATE, FERRITIN, TIBC, IRON, RETICCTPCT in the last 72 hours. Urine analysis:    Component Value Date/Time   COLORURINE YELLOW 05/22/2015 1125   APPEARANCEUR CLEAR 05/22/2015 1125   LABSPEC 1.013 05/22/2015 1125   PHURINE 6.0 05/22/2015 1125   GLUCOSEU NEGATIVE 05/22/2015 1125   HGBUR NEGATIVE 05/22/2015 1125   BILIRUBINUR negative 06/23/2018 1347   KETONESUR negative 06/23/2018 1347   KETONESUR NEGATIVE 05/22/2015 1125   PROTEINUR NEGATIVE 05/22/2015 1125   UROBILINOGEN 0.2 06/23/2018 1347   UROBILINOGEN 0.2 03/21/2010 1728   NITRITE Negative 06/23/2018 1347   NITRITE NEGATIVE 05/22/2015 1125   LEUKOCYTESUR Negative 06/23/2018 1347   Sepsis Labs: @LABRCNTIP (procalcitonin:4,lacticidven:4)  ) Recent Results (from the past 240 hours)  Respiratory (~20 pathogens) panel by PCR     Status: None   Collection Time: 03/21/24 10:34 PM   Specimen: Nasopharyngeal Swab; Respiratory  Result Value Ref Range Status   Adenovirus NOT DETECTED NOT DETECTED Final   Coronavirus 229E NOT DETECTED NOT DETECTED Final    Comment: (NOTE) The Coronavirus on the Respiratory Panel, DOES NOT test for the novel  Coronavirus (2019 nCoV)    Coronavirus HKU1 NOT DETECTED NOT DETECTED Final   Coronavirus NL63 NOT DETECTED NOT DETECTED Final   Coronavirus OC43 NOT DETECTED NOT DETECTED Final   Metapneumovirus NOT DETECTED NOT DETECTED Final   Rhinovirus / Enterovirus NOT DETECTED NOT DETECTED Final   Influenza A NOT DETECTED NOT DETECTED Final   Influenza B NOT DETECTED NOT DETECTED Final   Parainfluenza Virus 1 NOT DETECTED NOT DETECTED Final   Parainfluenza Virus 2 NOT DETECTED NOT DETECTED Final   Parainfluenza Virus 3 NOT DETECTED NOT DETECTED Final   Parainfluenza Virus 4 NOT DETECTED NOT DETECTED Final   Respiratory Syncytial Virus NOT DETECTED NOT DETECTED Final   Bordetella pertussis NOT DETECTED NOT DETECTED Final    Bordetella Parapertussis NOT DETECTED NOT DETECTED Final   Chlamydophila pneumoniae NOT DETECTED NOT DETECTED Final   Mycoplasma pneumoniae NOT DETECTED NOT DETECTED Final    Comment: Performed at [facility], [address] [place], [place] [address]     Radiology Studies: DG Chest 2 View Result Date: 03/21/2024 CLINICAL DATA:  Shortness of breath EXAM: CHEST - 2 VIEW COMPARISON:  12/11/2019 FINDINGS: Cardiomegaly with mild central congestion. No focal opacity, pleural effusion or pneumothorax. IMPRESSION: Cardiomegaly with mild central congestion. Electronically Signed   By: Luke Bun M.D.   On: 03/21/2024 17:36     Scheduled Meds:  furosemide   80 mg Intravenous BID   hydrALAZINE   25 mg Oral Q8H   isosorbide  dinitrate  10 mg Oral TID   potassium chloride   40 mEq Oral Once   Continuous Infusions:  heparin  1,000 Units/hr (03/22/24 9391)     LOS: 1 day    Time spent:    Sigurd Pac, MD [facility]   03/22/2024, 9:28 AM    "

## 2024-03-22 NOTE — ED Notes (Signed)
 Gave PRN Hydralazine  for High BP

## 2024-03-22 NOTE — Telephone Encounter (Signed)
 Patient Product/process Development Scientist completed.    The patient is insured through [employer]. Patient has [employer], may use a copay card, and/or apply for patient assistance if available.    Ran test claim for Eliquis 5 mg and the current 30 day co-pay is $75.00.  Ran test claim for Jardiance  10 mg and the current 30 day co-pay is $75.00.  Ran test claim for sacubitril -valsartan  24-26 mg and the current 30 day co-pay is $164.22.  Ran test claim for isosorbide -valsartan  24-26 mg and the current 30 day co-pay is $164.22.  This test claim was processed through [facility]- copay amounts may vary at other pharmacies due to pharmacy/plan contracts, or as the patient moves through the different stages of their insurance plan.     Reyes Sharps, CPHT Pharmacy Technician Patient Advocate Specialist Lead [facility] Patient Advocate Team Direct Number: [phone]  Fax: [phone]

## 2024-03-23 ENCOUNTER — Other Ambulatory Visit: Payer: Self-pay

## 2024-03-23 ENCOUNTER — Other Ambulatory Visit (HOSPITAL_COMMUNITY): Payer: Self-pay

## 2024-03-23 DIAGNOSIS — I5021 Acute systolic (congestive) heart failure: Secondary | ICD-10-CM | POA: Diagnosis not present

## 2024-03-23 DIAGNOSIS — I5023 Acute on chronic systolic (congestive) heart failure: Secondary | ICD-10-CM | POA: Diagnosis not present

## 2024-03-23 DIAGNOSIS — I5043 Acute on chronic combined systolic (congestive) and diastolic (congestive) heart failure: Secondary | ICD-10-CM | POA: Diagnosis not present

## 2024-03-23 LAB — CBC
HCT: 40.4 % (ref 39.0–52.0)
Hemoglobin: 13.3 g/dL (ref 13.0–17.0)
MCH: 29.2 pg (ref 26.0–34.0)
MCHC: 32.9 g/dL (ref 30.0–36.0)
MCV: 88.6 fL (ref 80.0–100.0)
Platelets: 388 K/uL (ref 150–400)
RBC: 4.56 MIL/uL (ref 4.22–5.81)
RDW: 14 % (ref 11.5–15.5)
WBC: 11.3 K/uL — ABNORMAL HIGH (ref 4.0–10.5)
nRBC: 0 % (ref 0.0–0.2)

## 2024-03-23 LAB — BASIC METABOLIC PANEL WITH GFR
Anion gap: 13 (ref 5–15)
Anion gap: 14 (ref 5–15)
BUN: 36 mg/dL — ABNORMAL HIGH (ref 6–20)
BUN: 29 mg/dL — ABNORMAL HIGH (ref 6–20)
CO2: 29 mmol/L (ref 22–32)
CO2: 28 mmol/L (ref 22–32)
Calcium: 8.6 mg/dL — ABNORMAL LOW (ref 8.9–10.3)
Calcium: 9.1 mg/dL (ref 8.9–10.3)
Chloride: 98 mmol/L (ref 98–111)
Chloride: 96 mmol/L — ABNORMAL LOW (ref 98–111)
Creatinine, Ser: 2.38 mg/dL — ABNORMAL HIGH (ref 0.61–1.24)
Creatinine, Ser: 2.28 mg/dL — ABNORMAL HIGH (ref 0.61–1.24)
GFR, Estimated: 34 mL/min — ABNORMAL LOW
GFR, Estimated: 36 mL/min — ABNORMAL LOW
Glucose, Bld: 149 mg/dL — ABNORMAL HIGH (ref 70–99)
Glucose, Bld: 184 mg/dL — ABNORMAL HIGH (ref 70–99)
Potassium: 2.6 mmol/L — CL (ref 3.5–5.1)
Potassium: 3.8 mmol/L (ref 3.5–5.1)
Sodium: 139 mmol/L (ref 135–145)
Sodium: 138 mmol/L (ref 135–145)

## 2024-03-23 LAB — HEMOGLOBIN A1C
Hgb A1c MFr Bld: 6.3 % — ABNORMAL HIGH (ref 4.8–5.6)
Mean Plasma Glucose: 134.11 mg/dL

## 2024-03-23 LAB — IRON AND TIBC
Iron: 46 ug/dL (ref 45–182)
Saturation Ratios: 12 % — ABNORMAL LOW (ref 17.9–39.5)
TIBC: 371 ug/dL (ref 250–450)
UIBC: 326 ug/dL

## 2024-03-23 LAB — RETICULOCYTES
Immature Retic Fract: 24.8 % — ABNORMAL HIGH (ref 2.3–15.9)
RBC.: 4.72 MIL/uL (ref 4.22–5.81)
Retic Count, Absolute: 167.1 K/uL (ref 19.0–186.0)
Retic Ct Pct: 3.5 % — ABNORMAL HIGH (ref 0.4–3.1)

## 2024-03-23 LAB — MAGNESIUM: Magnesium: 2 mg/dL (ref 1.7–2.4)

## 2024-03-23 LAB — LIPID PANEL
Cholesterol: 192 mg/dL (ref 0–200)
HDL: 43 mg/dL
LDL Cholesterol: 131 mg/dL — ABNORMAL HIGH (ref 0–99)
Total CHOL/HDL Ratio: 4.5 ratio
Triglycerides: 93 mg/dL
VLDL: 19 mg/dL (ref 0–40)

## 2024-03-23 LAB — VITAMIN B12: Vitamin B-12: 1225 pg/mL — ABNORMAL HIGH (ref 180–914)

## 2024-03-23 LAB — HEPARIN LEVEL (UNFRACTIONATED): Heparin Unfractionated: 0.42 [IU]/mL (ref 0.30–0.70)

## 2024-03-23 LAB — FOLATE: Folate: 7.4 ng/mL

## 2024-03-23 LAB — FERRITIN: Ferritin: 64 ng/mL (ref 24–336)

## 2024-03-23 LAB — TSH: TSH: 0.919 u[IU]/mL (ref 0.350–4.500)

## 2024-03-23 MED ORDER — ONDANSETRON HCL 4 MG/2ML IJ SOLN
4.0000 mg | Freq: Once | INTRAMUSCULAR | Status: DC
  Filled 2024-03-23: qty 2

## 2024-03-23 MED ORDER — FUROSEMIDE 10 MG/ML IJ SOLN
40.0000 mg | Freq: Once | INTRAMUSCULAR | Status: AC
  Administered 2024-03-23: 40 mg via INTRAVENOUS
  Filled 2024-03-23: qty 4

## 2024-03-23 MED ORDER — ATORVASTATIN CALCIUM 80 MG PO TABS
80.0000 mg | ORAL_TABLET | Freq: Every day | ORAL | Status: DC
  Administered 2024-03-24 – 2024-03-27 (×4): 80 mg via ORAL
  Filled 2024-03-23 (×5): qty 1

## 2024-03-23 MED ORDER — HYDRALAZINE HCL 50 MG PO TABS: 50.0000 mg | ORAL_TABLET | Freq: Three times a day (TID) | ORAL | Status: DC

## 2024-03-23 MED ORDER — CHLORHEXIDINE GLUCONATE CLOTH 2 % EX PADS
6.0000 | MEDICATED_PAD | Freq: Every day | CUTANEOUS | Status: DC
  Administered 2024-03-24 – 2024-03-26 (×3): 6 via TOPICAL

## 2024-03-23 MED ORDER — HYDRALAZINE HCL 50 MG PO TABS
75.0000 mg | ORAL_TABLET | Freq: Three times a day (TID) | ORAL | Status: DC
  Administered 2024-03-23 – 2024-03-24 (×3): 75 mg via ORAL
  Filled 2024-03-23 (×3): qty 1

## 2024-03-23 MED ORDER — SODIUM CHLORIDE 0.9% FLUSH
10.0000 mL | Freq: Two times a day (BID) | INTRAVENOUS | Status: DC
  Administered 2024-03-23 – 2024-03-27 (×9): 10 mL

## 2024-03-23 MED ORDER — HYDRALAZINE HCL 50 MG PO TABS
50.0000 mg | ORAL_TABLET | Freq: Once | ORAL | Status: AC
  Administered 2024-03-23: 50 mg via ORAL
  Filled 2024-03-23: qty 1

## 2024-03-23 MED ORDER — HYDRALAZINE HCL 25 MG PO TABS: 25.0000 mg | ORAL_TABLET | Freq: Once | ORAL | Status: DC

## 2024-03-23 MED ORDER — POTASSIUM CHLORIDE 20 MEQ PO PACK
60.0000 meq | PACK | Freq: Once | ORAL | Status: AC
  Administered 2024-03-23: 60 meq via ORAL
  Filled 2024-03-23: qty 3

## 2024-03-23 MED ORDER — SODIUM CHLORIDE 0.9% FLUSH: 10.0000 mL | INTRAVENOUS | Status: DC | PRN

## 2024-03-23 MED ORDER — POTASSIUM CHLORIDE 10 MEQ/100ML IV SOLN
10.0000 meq | INTRAVENOUS | Status: AC
  Administered 2024-03-23 (×3): 10 meq via INTRAVENOUS
  Filled 2024-03-23 (×3): qty 100

## 2024-03-23 MED ORDER — MAGNESIUM SULFATE 2 GM/50ML IV SOLN
2.0000 g | Freq: Once | INTRAVENOUS | Status: AC
  Administered 2024-03-23: 2 g via INTRAVENOUS
  Filled 2024-03-23: qty 50

## 2024-03-23 MED ORDER — ISOSORBIDE MONONITRATE ER 60 MG PO TB24
60.0000 mg | ORAL_TABLET | Freq: Every day | ORAL | Status: DC
  Administered 2024-03-23: 60 mg via ORAL
  Filled 2024-03-23: qty 1

## 2024-03-23 MED ORDER — POTASSIUM CHLORIDE CRYS ER 20 MEQ PO TBCR
40.0000 meq | EXTENDED_RELEASE_TABLET | Freq: Once | ORAL | Status: AC
  Administered 2024-03-23: 40 meq via ORAL
  Filled 2024-03-23: qty 2

## 2024-03-23 MED ORDER — FUROSEMIDE 10 MG/ML IJ SOLN
120.0000 mg | Freq: Two times a day (BID) | INTRAVENOUS | Status: DC
  Administered 2024-03-23: 120 mg via INTRAVENOUS
  Filled 2024-03-23 (×2): qty 12
  Filled 2024-03-23: qty 10

## 2024-03-23 NOTE — Progress Notes (Signed)
 Orthopedic Tech Progress Note Patient Details:  John Savage 17-Mar-1981 [id number]  Ortho Devices Type of Ortho Device: Ace wrap, Unna boot Ortho Device/Splint Location: BLE Ortho Device/Splint Interventions: Ordered, Application, Adjustment   Post Interventions Patient Tolerated: Well Instructions Provided: Care of device  [name] Pac 03/23/2024, 4:14 PM

## 2024-03-23 NOTE — Progress Notes (Signed)
 Peripherally Inserted Central Catheter Placement  The IV Nurse has discussed with the patient and/or persons authorized to consent for the patient, the purpose of this procedure and the potential benefits and risks involved with this procedure.  The benefits include less needle sticks, lab draws from the catheter, and the patient may be discharged home with the catheter. Risks include, but not limited to, infection, bleeding, blood clot (thrombus formation), and puncture of an artery; nerve damage and irregular heartbeat and possibility to perform a PICC exchange if needed/ordered by physician.  Alternatives to this procedure were also discussed.  Bard Power PICC patient education guide, fact sheet on infection prevention and patient information card has been provided to patient /or left at bedside.    PICC Placement Documentation  PICC Double Lumen 03/23/24 Right Brachial 38 cm 0 cm (Active)  Indication for Insertion or Continuance of Line Chronic illness with exacerbations (CF, Sickle Cell, etc.) 03/23/24 1000  Exposed Catheter (cm) 0 cm 03/23/24 1000  Site Assessment Clean, Dry, Intact 03/23/24 1000  Lumen #1 Status Flushed;Saline locked;Blood return noted 03/23/24 1000  Lumen #2 Status Flushed;Saline locked;Blood return noted 03/23/24 1000  Dressing Type Transparent;Securing device 03/23/24 1000  Dressing Status Antimicrobial disc/dressing in place;Clean, Dry, Intact 03/23/24 1000  Line Care Connections checked and tightened 03/23/24 1000  Line Adjustment (NICU/IV Team Only) No 03/23/24 1000  Dressing Intervention New dressing;Adhesive placed at insertion site (IV team only) 03/23/24 1000  Dressing Change Due 03/30/24 03/23/24 1000       John Savage 03/23/2024, 10:49 AM

## 2024-03-23 NOTE — Progress Notes (Signed)
 " PROGRESS NOTE    John Savage  FMW:[id number] DOB: 10/06/1980 DOA: 03/21/2024 PCP: No primary care provider on file.  43/M with history of hypertension presented to the ED with progressive swelling, shortness of breath and elevated BP.  In the ER he was hypertensive, hemoglobin 14, troponin 1411, BNP 21K, creatinine 2.4, chest x-ray with cardiomegaly and pulmonary vascular congestion. - Bedside POCUS concerning for low EF and LV thrombus   Subjective: Feels better, breathing is improving  Assessment and Plan:  Acute systolic CHF, new diagnosis -Likely hypertensive cardiomyopathy versus A-fib mediated - Bedside echo with EF less than 20%, large mobile LV thrombus, severely reduced RV, grade 3 DD -Heart failure team consulting, Lasix  increased to 120 mg twice daily -PICC line ordered -Continue hydralazine  and Imdur , dose increased  Large LV thrombus - Continue IV heparin ,   Paroxysmal atrial fibrillation, flutter - Currently appears to be in sinus tach, anticoagulation as above  Elevated troponin, NSTEMI -Troponin 1.4 K - Plan for ischemic eval if kidney function improves  Uncontrolled hypertension - Now improving with diuresis, Coreg  and olmesartan  on hold - Diuretics, Imdur -hydralazine   Acute kidney injury -Creatinine 2.5 on admission, baseline 1.1 from 2 years ago - Likely cardiorenal - Holding ARB, diuretics as above  Congestive hepatopathy  Hyperglycemia, borderline diabetes - Hemoglobin A1C6.3   DVT prophylaxis: IV heparin  Code Status: Full code Family Communication: None present Disposition Plan: Home pending above workup  Consultants: Cards   Procedures:   Antimicrobials:    Objective: Vitals:   03/22/24 2300 03/22/24 2351 03/23/24 0300 03/23/24 0745  BP: (!) 143/99 (!) 143/99 (!) 179/128 (!) 176/119  Pulse: (!) 112  (!) 110 (!) 114  Resp: 19  20 17   Temp: 97.6 F (36.4 C)  98.5 F (36.9 C) 97.8 F (36.6 C)  TempSrc: Oral  Oral Oral   SpO2: 95%  95% 96%  Weight:      Height:        Intake/Output Summary (Last 24 hours) at 03/23/2024 1158 Last data filed at 03/23/2024 1110 Gross per 24 hour  Intake 1509.64 ml  Output 2000 ml  Net -490.36 ml   Filed Weights   03/21/24 1645 03/22/24 1210  Weight: 84.4 kg 73.1 kg    Examination:  General exam: Appears calm and comfortable, AO x 3 HEENT: JVD + Respiratory system: Few basilar rales Cardiovascular system: S1 & S2 heard, RRR.  Abd: nondistended, soft and nontender.Normal bowel sounds heard. Central nervous system: Alert and oriented. No focal neurological deficits. Extremities: 2+ edema Skin: No rashes Psychiatry:  Mood & affect appropriate.     Data Reviewed:   CBC: Recent Labs  Lab 03/21/24 1647 03/21/24 2228 03/22/24 0500 03/23/24 0357  WBC 8.2 9.6 9.1 11.3*  HGB 14.1 13.7 13.7 13.3  HCT 43.4 41.4 41.4 40.4  MCV 90.6 88.8 89.4 88.6  PLT 410* 395 379 388   Basic Metabolic Panel: Recent Labs  Lab 03/21/24 1655 03/21/24 2228 03/22/24 0500 03/22/24 1033 03/23/24 0357  NA 138  --  143  --  139  K 3.0*  --  3.6  --  2.6*  CL 101  --  103  --  98  CO2 21*  --  25  --  29  GLUCOSE 145*  --  170*  --  149*  BUN 33*  --  34*  --  36*  CREATININE 2.46* 2.28* 2.25*  --  2.38*  CALCIUM  9.1  --  9.0  --  8.6*  MG  --   --   --  1.7 2.0   GFR: Estimated Creatinine Clearance: 35.2 mL/min (A) (by C-G formula based on SCr of 2.38 mg/dL (H)). Liver Function Tests: Recent Labs  Lab 03/21/24 1655  AST 65*  ALT 90*  ALKPHOS 117  BILITOT 0.8  PROT 6.1*  ALBUMIN 3.6   No results for input(s): LIPASE, AMYLASE in the last 168 hours. No results for input(s): AMMONIA in the last 168 hours. Coagulation Profile: Recent Labs  Lab 03/22/24 0500  INR 1.2   Cardiac Enzymes: No results for input(s): CKTOTAL, CKMB, CKMBINDEX, TROPONINI in the last 168 hours. BNP (last 3 results) Recent Labs    03/21/24 1647  PROBNP 21,140.0*    HbA1C: Recent Labs    03/23/24 0357  HGBA1C 6.3*   CBG: No results for input(s): GLUCAP in the last 168 hours. Lipid Profile: Recent Labs    03/23/24 0826  CHOL 192  HDL 43  LDLCALC 131*  TRIG 93  CHOLHDL 4.5   Thyroid Function Tests: Recent Labs    03/23/24 0826  TSH 0.919   Anemia Panel: Recent Labs    03/23/24 0826  VITAMINB12 1,225*  FOLATE 7.4  FERRITIN 64  TIBC 371  IRON 46  RETICCTPCT 3.5*   Urine analysis:    Component Value Date/Time   COLORURINE YELLOW 05/22/2015 1125   APPEARANCEUR CLEAR 05/22/2015 1125   LABSPEC 1.013 05/22/2015 1125   PHURINE 6.0 05/22/2015 1125   GLUCOSEU NEGATIVE 05/22/2015 1125   HGBUR NEGATIVE 05/22/2015 1125   BILIRUBINUR negative 06/23/2018 1347   KETONESUR negative 06/23/2018 1347   KETONESUR NEGATIVE 05/22/2015 1125   PROTEINUR NEGATIVE 05/22/2015 1125   UROBILINOGEN 0.2 06/23/2018 1347   UROBILINOGEN 0.2 03/21/2010 1728   NITRITE Negative 06/23/2018 1347   NITRITE NEGATIVE 05/22/2015 1125   LEUKOCYTESUR Negative 06/23/2018 1347   Sepsis Labs: @LABRCNTIP (procalcitonin:4,lacticidven:4)  ) Recent Results (from the past 240 hours)  Respiratory (~20 pathogens) panel by PCR     Status: None   Collection Time: 03/21/24 10:34 PM   Specimen: Nasopharyngeal Swab; Respiratory  Result Value Ref Range Status   Adenovirus NOT DETECTED NOT DETECTED Final   Coronavirus 229E NOT DETECTED NOT DETECTED Final    Comment: (NOTE) The Coronavirus on the Respiratory Panel, DOES NOT test for the novel  Coronavirus (2019 nCoV)    Coronavirus HKU1 NOT DETECTED NOT DETECTED Final   Coronavirus NL63 NOT DETECTED NOT DETECTED Final   Coronavirus OC43 NOT DETECTED NOT DETECTED Final   Metapneumovirus NOT DETECTED NOT DETECTED Final   Rhinovirus / Enterovirus NOT DETECTED NOT DETECTED Final   Influenza A NOT DETECTED NOT DETECTED Final   Influenza B NOT DETECTED NOT DETECTED Final   Parainfluenza Virus 1 NOT DETECTED NOT  DETECTED Final   Parainfluenza Virus 2 NOT DETECTED NOT DETECTED Final   Parainfluenza Virus 3 NOT DETECTED NOT DETECTED Final   Parainfluenza Virus 4 NOT DETECTED NOT DETECTED Final   Respiratory Syncytial Virus NOT DETECTED NOT DETECTED Final   Bordetella pertussis NOT DETECTED NOT DETECTED Final   Bordetella Parapertussis NOT DETECTED NOT DETECTED Final   Chlamydophila pneumoniae NOT DETECTED NOT DETECTED Final   Mycoplasma pneumoniae NOT DETECTED NOT DETECTED Final    Comment: Performed at [facility], [address], [place], [place] [address]     Radiology Studies: US  EKG SITE RITE Result Date: 03/23/2024 If Site Rite image not attached, placement could not be confirmed due to current cardiac rhythm.  ECHOCARDIOGRAM COMPLETE Result Date: 03/22/2024    ECHOCARDIOGRAM REPORT   Patient Name:   KINGSTEN ENFIELD Date of Exam: 03/22/2024 Medical Rec #:  [id number]             Height:       65.0 in Accession #:    [id number]            Weight:       161.2 lb Date of Birth:  1981/03/23             BSA:          1.805 m Patient Age:    43 years              BP:           167/127 mmHg Patient Gender: M                     HR:           92 bpm. Exam Location:  Inpatient Procedure: 2D Echo, Color Doppler and Cardiac Doppler (Both Spectral and Color            Flow Doppler were utilized during procedure). Indications:    CHF I50.21  History:        Patient has prior history of Echocardiogram examinations, most                 recent 12/12/2019. Risk Factors:Hypertension.  Sonographer:    Nathanel Devonshire Referring Phys: [id number] ROBERT DORRELL IMPRESSIONS  1. Large mobile LV apical thrombus noted 1.5 x 2.2 cm. Left ventricular ejection fraction, by estimation, is <20%. The left ventricle has severely decreased function. The left ventricle demonstrates global hypokinesis. There is mild concentric left ventricular hypertrophy. Left ventricular diastolic parameters are consistent with Grade III diastolic  dysfunction (restrictive).  2. Right ventricular systolic function is severely reduced. The right ventricular size is normal. There is mildly elevated pulmonary artery systolic pressure. The estimated right ventricular systolic pressure is 42.9 mmHg.  3. Left atrial size was moderately dilated.  4. Right atrial size was mildly dilated.  5. The mitral valve is normal in structure. Mild mitral valve regurgitation. No evidence of mitral stenosis.  6. The aortic valve is tricuspid. Aortic valve regurgitation is mild. Aortic valve sclerosis/calcification is present, without any evidence of aortic stenosis.  7. The inferior vena cava is dilated in size with <50% respiratory variability, suggesting right atrial pressure of 15 mmHg. Conclusion(s)/Recommendation(s): Chart reviewed. Rounding team aware of LV thrombus. Patient on heparin . FINDINGS  Left Ventricle: Large mobile LV apical thrombus noted 1.5 x 2.2 cm. Left ventricular ejection fraction, by estimation, is <20%. The left ventricle has severely decreased function. The left ventricle demonstrates global hypokinesis. The left ventricular internal cavity size was normal in size. There is mild concentric left ventricular hypertrophy. Left ventricular diastolic parameters are consistent with Grade III diastolic dysfunction (restrictive). Right Ventricle: The right ventricular size is normal. No increase in right ventricular wall thickness. Right ventricular systolic function is severely reduced. There is mildly elevated pulmonary artery systolic pressure. The tricuspid regurgitant velocity is 2.64 m/s, and with an assumed right atrial pressure of 15 mmHg, the estimated right ventricular systolic pressure is 42.9 mmHg. Left Atrium: Left atrial size was moderately dilated. Right Atrium: Right atrial size was mildly dilated. Pericardium: There is no evidence of pericardial effusion. Mitral Valve: The mitral valve is normal in structure. Mild mitral valve regurgitation. No  evidence of mitral valve stenosis.  Tricuspid Valve: The tricuspid valve is normal in structure. Tricuspid valve regurgitation is mild . No evidence of tricuspid stenosis. Aortic Valve: The aortic valve is tricuspid. Aortic valve regurgitation is mild. Aortic valve sclerosis/calcification is present, without any evidence of aortic stenosis. Aortic valve mean gradient measures 2.0 mmHg. Aortic valve peak gradient measures 3.9 mmHg. Aortic valve area, by VTI measures 1.87 cm. Pulmonic Valve: The pulmonic valve was normal in structure. Pulmonic valve regurgitation is not visualized. No evidence of pulmonic stenosis. Aorta: The aortic root is normal in size and structure. Venous: The inferior vena cava is dilated in size with less than 50% respiratory variability, suggesting right atrial pressure of 15 mmHg. IAS/Shunts: No atrial level shunt detected by color flow Doppler.  LEFT VENTRICLE PLAX 2D LVIDd:         4.20 cm     Diastology LVIDs:         3.70 cm     LV e' medial:    4.57 cm/s LV PW:         1.20 cm     LV E/e' medial:  22.5 LV IVS:        1.30 cm     LV e' lateral:   6.85 cm/s LVOT diam:     1.80 cm     LV E/e' lateral: 15.0 LV SV:         27 LV SV Index:   15 LVOT Area:     2.54 cm LV IVRT:       90 msec  LV Volumes (MOD) LV vol d, MOD A2C: 74.4 ml LV vol d, MOD A4C: 80.2 ml LV vol s, MOD A2C: 62.0 ml LV vol s, MOD A4C: 66.0 ml LV SV MOD A2C:     12.4 ml LV SV MOD A4C:     80.2 ml LV SV MOD BP:      14.4 ml RIGHT VENTRICLE            IVC RV Basal diam:  3.30 cm    IVC diam: 1.90 cm RV S prime:     5.98 cm/s TAPSE (M-mode): 0.9 cm     PULMONARY VEINS                            Diastolic Velocity: 36.70 cm/s                            S/D Velocity:       0.70                            Systolic Velocity:  26.30 cm/s LEFT ATRIUM             Index        RIGHT ATRIUM           Index LA diam:        4.00 cm 2.22 cm/m   RA Area:     14.70 cm LA Vol (A2C):   55.5 ml 30.75 ml/m  RA Volume:   38.70 ml  21.44  ml/m LA Vol (A4C):   51.4 ml 28.48 ml/m LA Biplane Vol: 55.6 ml 30.81 ml/m  AORTIC VALVE AV Area (Vmax):    1.93 cm AV Area (Vmean):   1.98 cm AV Area (VTI):     1.87 cm AV Vmax:  98.30 cm/s AV Vmean:          69.800 cm/s AV VTI:            0.143 m AV Peak Grad:      3.9 mmHg AV Mean Grad:      2.0 mmHg LVOT Vmax:         74.60 cm/s LVOT Vmean:        54.400 cm/s LVOT VTI:          0.105 m LVOT/AV VTI ratio: 0.73  AORTA Ao Root diam: 3.50 cm Ao Asc diam:  3.50 cm MITRAL VALVE                TRICUSPID VALVE MV Area (PHT): 9.60 cm     TR Peak grad:   27.9 mmHg MV Decel Time: 79 msec      TR Vmax:        264.00 cm/s MV E velocity: 103.00 cm/s MV A velocity: 33.00 cm/s   SHUNTS MV E/A ratio:  3.12         Systemic VTI:  0.10 m                             Systemic Diam: 1.80 cm Toribio Fuel MD Electronically signed by Toribio Fuel MD Signature Date/Time: 03/22/2024/6:58:00 PM    Final    DG Chest 2 View Result Date: 03/21/2024 CLINICAL DATA:  Shortness of breath EXAM: CHEST - 2 VIEW COMPARISON:  12/11/2019 FINDINGS: Cardiomegaly with mild central congestion. No focal opacity, pleural effusion or pneumothorax. IMPRESSION: Cardiomegaly with mild central congestion. Electronically Signed   By: Luke Bun M.D.   On: 03/21/2024 17:36     Scheduled Meds:  Chlorhexidine  Gluconate Cloth  6 each Topical Daily   hydrALAZINE   75 mg Oral Q8H   isosorbide  mononitrate  60 mg Oral Daily   sodium chloride  flush  10-40 mL Intracatheter Q12H   Continuous Infusions:  furosemide      heparin  1,000 Units/hr (03/22/24 1641)   magnesium  sulfate bolus IVPB     potassium chloride  10 mEq (03/23/24 1105)     LOS: 2 days    Time spent:    Sigurd Pac, MD [facility]   03/23/2024, 11:58 AM    "

## 2024-03-23 NOTE — Progress Notes (Signed)
 Orthopedic Tech Progress Note Patient Details:  John Savage 10/10/1980 [id number]  Went to apply UNNA BOOTS to patient. There is a note on the door stating  DO NOT ENTER, STERILE PROCEDURE: will return later today    Patient ID: John Savage, male   DOB: 1981/01/17, 43 y.o.   MRN: [id number]  [name] Pac 03/23/2024, 10:47 AM

## 2024-03-23 NOTE — Progress Notes (Signed)
 ANTICOAGULATION CONSULT NOTE  Pharmacy Consult for Heparin  Indication: LV Thrombus  Allergies[1]  Patient Measurements: Height: 5' 5 (165.1 cm) Weight: 73.1 kg (161 lb 2.5 oz) IBW/kg (Calculated) : 61.5 Heparin  Dosing Weight: 79.1 kg  Vital Signs: Temp: 98.5 F (36.9 C) (12/23 0300) Temp Source: Oral (12/23 0300) BP: 179/128 (12/23 0300) Pulse Rate: 110 (12/23 0300)  Labs: Recent Labs    03/21/24 2228 03/22/24 0500 03/22/24 1232 03/23/24 0357  HGB 13.7 13.7  --  13.3  HCT 41.4 41.4  --  40.4  PLT 395 379  --  388  LABPROT  --  15.6*  --   --   INR  --  1.2  --   --   HEPARINUNFRC  --  0.80* 0.57 0.42  CREATININE 2.28* 2.25*  --  2.38*    Estimated Creatinine Clearance: 35.2 mL/min (A) (by C-G formula based on SCr of 2.38 mg/dL (H)).   Medical History: Past Medical History:  Diagnosis Date   Hypertension    Assessment: 36 yom presenting with ankle/leg swelling and high blood pressure. Bedside US  by cardiology w/ LV Thrombus. Heparin  per pharmacy consult placed for LV thrombus. Patient is not on anticoagulation prior to arrival.  Heparin  level remains therapeutic at 0.42, CBC stable.  Goal of Therapy:  Heparin  level 0.3-0.7 units/ml Monitor platelets by anticoagulation protocol: Yes   Plan:  Continue heparin  infusion at 1000 units/hr Daily heparin  level, CBC  Ozell Jamaica, PharmD, BCPS, [facility] Clinical Pharmacist [phone] Please check AMION for all [facility] numbers 03/23/2024      [1]  Allergies Allergen Reactions   Novocain [Procaine] Other (See Comments)    Unknown reaction   Porcine (Pork) Protein-Containing Drug Products Other (See Comments)    Religious reason

## 2024-03-23 NOTE — TOC Initial Note (Signed)
 Transition of Care [facility]) - Initial/Assessment Note    Patient Details  Name: John Savage MRN: [id number] Date of Birth: December 16, 1980  [facility] CM/SW Contact:    Arlana JINNY Nicholaus ISRAEL Phone Number: [phone] 03/23/2024, 12:43 PM  Clinical Narrative:  HF CSW met with patient at bedside. Patient stated that he lives with wife and children. Patient stated that he works full-time and will need a note for work. Patient stated that he has no history of HH services. Patient stated that he does not use any equipment. Patient stated that he has a scale at home. Patient stated that he does not have a PCP. CSW explained that hospital follow up appointment is typically scheduled closer towards dc. Patient is agreeable.   HF CSW/CM will continue to follow and monitor for dc readiness.                        Patient Goals and CMS Choice            Expected Discharge Plan and Services                                              Prior Living Arrangements/Services                       Activities of Daily Living   ADL Screening (condition at time of admission) Independently performs ADLs?: Yes (appropriate for developmental age) Is the patient deaf or have difficulty hearing?: No Does the patient have difficulty seeing, even when wearing glasses/contacts?: No Does the patient have difficulty concentrating, remembering, or making decisions?: No  Permission Sought/Granted                  Emotional Assessment              Admission diagnosis:  CHF (congestive heart failure) (HCC) [I50.9] Acute congestive heart failure, unspecified heart failure type [facility]) [I50.9] Patient Active Problem List   Diagnosis Date Noted   CHF (congestive heart failure) (HCC) 03/21/2024   Hyperlipidemia 06/27/2020   Hypertensive emergency 12/11/2019   Left ventricular hypertrophy by electrocardiogram 07/28/2018   Acute upper respiratory infection  06/07/2018   Headache 06/07/2018   Abnormal brain MRI 06/07/2018   Severe hypertension 06/06/2018   PCP:  No primary care provider on file. Pharmacy:   [id number] [address], [place] - [address] AT [facility] [address] [place] [address] Phone: [phone] Fax: [phone]  [facility] [address] [place] [address] Phone: [phone] Fax: [phone]     Social Drivers of Health (SDOH) Social History: SDOH Screenings   Food Insecurity: No Food Insecurity (03/22/2024)  Housing: Low Risk (03/22/2024)  Transportation Needs: No Transportation Needs (03/22/2024)  Utilities: Not At Risk (03/22/2024)  Tobacco Use: Low Risk (03/21/2024)   SDOH Interventions:     Readmission Risk Interventions    03/22/2024    3:02 PM  Readmission Risk Prevention Plan  Medication Screening Complete  Transportation Screening Complete

## 2024-03-23 NOTE — Consult Note (Addendum)
 "   Advanced Heart Failure Team Consult Note   Primary Physician: Celestia Rosaline SQUIBB, NP Cardiologist:  [name], MD HPI:    John Savage is seen today for evaluation of acute systolic heart failure at the request of Dr. Fairy.   John Savage is a 43 y.o. male with uncontrolled HTN.   Admitted yesterday with uncontrolled HTN, SOB, BLE edema and chest pressure. He reports not seeing a PCP in a while. He would get his BP meds by just requesting refills through the portal. Reports compliance with all medications. Lives with his wife and children. Only cardiac family history is in his dad but he is unsure of exactly what he had. Denies ETOH, tobacco or illicit drug use. 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 He only has to go into the office 3x/month. Reports symptoms started around 2 weeks ago (SOB and BLE edema). Reported chest pressure in his R upper chest while he was eating dinner over the weekend.   BP 214/187 on admission. Admission labs reviewed: K 3, SCr 2.46, AST/ALT 65/90, Pro-BNP 21K, HsTrop 1.4K>1.2K. CXR with central congestion. EKG with ST 120s. Lactic acid 1.7. EKG yesterday with a fib/flutter RVR. Hgb A1c 6.3. Echo this admission with EF <20%, large mobile LV apical thrombus noted, LV with GHK, mild concentric LVH, GIIIDD, RV severely reduced, LA mod dilated, RA mildly dilated, mild MR. Started on IV heparin  for NSTEMI and LV thrombus.   Resting comfortably in bed. Diuresis has been started with IV lasix  and his breathing feels better. -2.2L UOP.   Home Medications Prior to Admission medications  Medication Sig Start Date End Date Taking? Authorizing Provider  acetaminophen  (TYLENOL ) 500 MG tablet Take 1,000 mg by mouth every 6 (six) hours as needed for moderate pain (pain score 4-6).   Yes [provider]  amLODipine  (NORVASC ) 10 MG tablet Take 1 tablet (10 mg total) by mouth daily. 04/14/23  Yes Lesches Dorn PARAS, MD   carvedilol  (COREG ) 25 MG tablet TAKE 1 TABLET (25 MG TOTAL) BY MOUTH TWICE A DAY WITH MEALS 04/14/23  Yes Lesches Dorn PARAS, MD  hydrALAZINE  (APRESOLINE ) 50 MG tablet Take 1 tablet (50 mg total) by mouth in the morning and at bedtime. 10/14/23  Yes Lesches Dorn PARAS, MD  olmesartan  (BENICAR ) 20 MG tablet TAKE 1 TABLET BY MOUTH EVERY DAY 03/03/24  Yes Lesches Dorn PARAS, MD  ondansetron  (ZOFRAN -ODT) 4 MG disintegrating tablet Take 4 mg by mouth every 8 (eight) hours as needed. 02/27/24  Yes [provider]  amoxicillin  (AMOXIL ) 875 MG tablet Take 1 tablet (875 mg total) by mouth 2 (two) times daily. Patient not taking: Reported on 03/21/2023 06/20/21   Arloa Suzen RAMAN, NP  atorvastatin  (LIPITOR ) 40 MG tablet TAKE 1 TABLET BY MOUTH EVERY DAY Patient not taking: Reported on 03/21/2024 05/06/23   Emelia Josefa HERO, NP  potassium chloride  SA (KLOR-CON  M20) 20 MEQ tablet Take 1 tablet (20 mEq total) by mouth daily for 4 days. Patient not taking: Reported on 03/21/2024 01/18/22 01/22/22  Emelia Josefa HERO, NP    Past Medical History: Past Medical History:  Diagnosis Date   Hypertension     Past Surgical History: History reviewed. No pertinent surgical history.  Family History: Family History  Problem Relation Age of Onset   Healthy Mother    Healthy Father     Social History: Social History   Socioeconomic History   Marital status: Married    Spouse name: Not on  file   Number of children: Not on file   Years of education: Not on file   Highest education level: Not on file  Occupational History   Not on file  Tobacco Use   Smoking status: Never   Smokeless tobacco: Never  Vaping Use   Vaping status: Never Used  Substance and Sexual Activity   Alcohol use: No   Drug use: No   Sexual activity: Not on file  Other Topics Concern   Not on file  Social History Narrative   Right handed   Lives at home with wife and kids   Social Drivers of Health   Tobacco Use: Low Risk  (03/21/2024)   Patient History    Smoking Tobacco Use: Never    Smokeless Tobacco Use: Never    Passive Exposure: Not on file  Financial Resource Strain: Not on file  Food Insecurity: No Food Insecurity (03/22/2024)   Epic    Worried About Programme Researcher, Broadcasting/film/video in the Last Year: Never true    Ran Out of Food in the Last Year: Never true  Transportation Needs: No Transportation Needs (03/22/2024)   Epic    Lack of Transportation (Medical): No    Lack of Transportation (Non-Medical): No  Physical Activity: Not on file  Stress: Not on file  Social Connections: Not on file  Depression (EYV7-0): Not on file  Alcohol Screen: Not on file  Housing: Low Risk (03/22/2024)   Epic    Unable to Pay for Housing in the Last Year: No    Number of Times Moved in the Last Year: 0    Homeless in the Last Year: No  Utilities: Not At Risk (03/22/2024)   Epic    Threatened with loss of utilities: No  Health Literacy: Not on file    Allergies:  Allergies[1]  Objective:   Vital Signs:   Temp:  [97.6 F (36.4 C)-98.5 F (36.9 C)] 98.5 F (36.9 C) (12/23 0300) Pulse Rate:  [94-112] 110 (12/23 0300) Resp:  [19-22] 20 (12/23 0300) BP: (136-179)/(99-136) 179/128 (12/23 0300) SpO2:  [94 %-100 %] 95 % (12/23 0300) Weight:  [73.1 kg] 73.1 kg (12/22 1210) Last BM Date : 03/22/24  Weight change: Filed Weights   03/21/24 1645 03/22/24 1210  Weight: 84.4 kg 73.1 kg    Intake/Output:   Intake/Output Summary (Last 24 hours) at 03/23/2024 0715 Last data filed at 03/22/2024 2300 Gross per 24 hour  Intake 1269.64 ml  Output 2250 ml  Net -980.36 ml    Physical Exam  General:  well appearing.   Cor: Irregular rate & reg? rhythm. No murmurs. JVD to jaw.  Lungs: clear, diminished bases Extremities: +2 BLE edema. +1 edema to thigh. Warm to touch.   Telemetry   ST 120s (Personally reviewed)    EKG    ST 120s on admission  Labs   Basic Metabolic Panel: Recent Labs  Lab 03/21/24 1655  03/21/24 2228 03/22/24 0500 03/22/24 1033 03/23/24 0357  NA 138  --  143  --  139  K 3.0*  --  3.6  --  2.6*  CL 101  --  103  --  98  CO2 21*  --  25  --  29  GLUCOSE 145*  --  170*  --  149*  BUN 33*  --  34*  --  36*  CREATININE 2.46* 2.28* 2.25*  --  2.38*  CALCIUM  9.1  --  9.0  --  8.6*  MG  --   --   --  1.7  --     Liver Function Tests: Recent Labs  Lab 03/21/24 1655  AST 65*  ALT 90*  ALKPHOS 117  BILITOT 0.8  PROT 6.1*  ALBUMIN 3.6   No results for input(s): LIPASE, AMYLASE in the last 168 hours. No results for input(s): AMMONIA in the last 168 hours.  CBC: Recent Labs  Lab 03/21/24 1647 03/21/24 2228 03/22/24 0500 03/23/24 0357  WBC 8.2 9.6 9.1 11.3*  HGB 14.1 13.7 13.7 13.3  HCT 43.4 41.4 41.4 40.4  MCV 90.6 88.8 89.4 88.6  PLT 410* 395 379 388    Cardiac Enzymes: No results for input(s): CKTOTAL, CKMB, CKMBINDEX, TROPONINI in the last 168 hours.  BNP: BNP (last 3 results) No results for input(s): BNP in the last 8760 hours.  ProBNP (last 3 results) Recent Labs    03/21/24 1647  PROBNP 21,140.0*     CBG: No results for input(s): GLUCAP in the last 168 hours.  Coagulation Studies: Recent Labs    03/22/24 0500  LABPROT 15.6*  INR 1.2     Imaging   ECHOCARDIOGRAM COMPLETE Result Date: 03/22/2024    ECHOCARDIOGRAM REPORT   Patient Name:   John Savage Date of Exam: 03/22/2024 Medical Rec #:  [id number]             Height:       65.0 in Accession #:    [id number]            Weight:       161.2 lb Date of Birth:  1981/03/15             BSA:          1.805 m Patient Age:    43 years              BP:           167/127 mmHg Patient Gender: M                     HR:           92 bpm. Exam Location:  Inpatient Procedure: 2D Echo, Color Doppler and Cardiac Doppler (Both Spectral and Color            Flow Doppler were utilized during procedure). Indications:    CHF I50.21  History:        Patient has prior history of  Echocardiogram examinations, most                 recent 12/12/2019. Risk Factors:Hypertension.  Sonographer:    Nathanel Devonshire Referring Phys: [id number] ROBERT DORRELL IMPRESSIONS  1. Large mobile LV apical thrombus noted 1.5 x 2.2 cm. Left ventricular ejection fraction, by estimation, is <20%. The left ventricle has severely decreased function. The left ventricle demonstrates global hypokinesis. There is mild concentric left ventricular hypertrophy. Left ventricular diastolic parameters are consistent with Grade III diastolic dysfunction (restrictive).  2. Right ventricular systolic function is severely reduced. The right ventricular size is normal. There is mildly elevated pulmonary artery systolic pressure. The estimated right ventricular systolic pressure is 42.9 mmHg.  3. Left atrial size was moderately dilated.  4. Right atrial size was mildly dilated.  5. The mitral valve is normal in structure. Mild mitral valve regurgitation. No evidence of mitral stenosis.  6. The aortic valve is tricuspid. Aortic valve regurgitation is mild. Aortic valve sclerosis/calcification is present, without any evidence of aortic stenosis.  7. The inferior vena cava is dilated in  size with <50% respiratory variability, suggesting right atrial pressure of 15 mmHg. Conclusion(s)/Recommendation(s): Chart reviewed. Rounding team aware of LV thrombus. Patient on heparin . FINDINGS  Left Ventricle: Large mobile LV apical thrombus noted 1.5 x 2.2 cm. Left ventricular ejection fraction, by estimation, is <20%. The left ventricle has severely decreased function. The left ventricle demonstrates global hypokinesis. The left ventricular internal cavity size was normal in size. There is mild concentric left ventricular hypertrophy. Left ventricular diastolic parameters are consistent with Grade III diastolic dysfunction (restrictive). Right Ventricle: The right ventricular size is normal. No increase in right ventricular wall thickness. Right  ventricular systolic function is severely reduced. There is mildly elevated pulmonary artery systolic pressure. The tricuspid regurgitant velocity is 2.64 m/s, and with an assumed right atrial pressure of 15 mmHg, the estimated right ventricular systolic pressure is 42.9 mmHg. Left Atrium: Left atrial size was moderately dilated. Right Atrium: Right atrial size was mildly dilated. Pericardium: There is no evidence of pericardial effusion. Mitral Valve: The mitral valve is normal in structure. Mild mitral valve regurgitation. No evidence of mitral valve stenosis. Tricuspid Valve: The tricuspid valve is normal in structure. Tricuspid valve regurgitation is mild . No evidence of tricuspid stenosis. Aortic Valve: The aortic valve is tricuspid. Aortic valve regurgitation is mild. Aortic valve sclerosis/calcification is present, without any evidence of aortic stenosis. Aortic valve mean gradient measures 2.0 mmHg. Aortic valve peak gradient measures 3.9 mmHg. Aortic valve area, by VTI measures 1.87 cm. Pulmonic Valve: The pulmonic valve was normal in structure. Pulmonic valve regurgitation is not visualized. No evidence of pulmonic stenosis. Aorta: The aortic root is normal in size and structure. Venous: The inferior vena cava is dilated in size with less than 50% respiratory variability, suggesting right atrial pressure of 15 mmHg. IAS/Shunts: No atrial level shunt detected by color flow Doppler.  LEFT VENTRICLE PLAX 2D LVIDd:         4.20 cm     Diastology LVIDs:         3.70 cm     LV e' medial:    4.57 cm/s LV PW:         1.20 cm     LV E/e' medial:  22.5 LV IVS:        1.30 cm     LV e' lateral:   6.85 cm/s LVOT diam:     1.80 cm     LV E/e' lateral: 15.0 LV SV:         27 LV SV Index:   15 LVOT Area:     2.54 cm LV IVRT:       90 msec  LV Volumes (MOD) LV vol d, MOD A2C: 74.4 ml LV vol d, MOD A4C: 80.2 ml LV vol s, MOD A2C: 62.0 ml LV vol s, MOD A4C: 66.0 ml LV SV MOD A2C:     12.4 ml LV SV MOD A4C:     80.2 ml LV  SV MOD BP:      14.4 ml RIGHT VENTRICLE            IVC RV Basal diam:  3.30 cm    IVC diam: 1.90 cm RV S prime:     5.98 cm/s TAPSE (M-mode): 0.9 cm     PULMONARY VEINS                            Diastolic Velocity: 36.70 cm/s  S/D Velocity:       0.70                            Systolic Velocity:  26.30 cm/s LEFT ATRIUM             Index        RIGHT ATRIUM           Index LA diam:        4.00 cm 2.22 cm/m   RA Area:     14.70 cm LA Vol (A2C):   55.5 ml 30.75 ml/m  RA Volume:   38.70 ml  21.44 ml/m LA Vol (A4C):   51.4 ml 28.48 ml/m LA Biplane Vol: 55.6 ml 30.81 ml/m  AORTIC VALVE AV Area (Vmax):    1.93 cm AV Area (Vmean):   1.98 cm AV Area (VTI):     1.87 cm AV Vmax:           98.30 cm/s AV Vmean:          69.800 cm/s AV VTI:            0.143 m AV Peak Grad:      3.9 mmHg AV Mean Grad:      2.0 mmHg LVOT Vmax:         74.60 cm/s LVOT Vmean:        54.400 cm/s LVOT VTI:          0.105 m LVOT/AV VTI ratio: 0.73  AORTA Ao Root diam: 3.50 cm Ao Asc diam:  3.50 cm MITRAL VALVE                TRICUSPID VALVE MV Area (PHT): 9.60 cm     TR Peak grad:   27.9 mmHg MV Decel Time: 79 msec      TR Vmax:        264.00 cm/s MV E velocity: 103.00 cm/s MV A velocity: 33.00 cm/s   SHUNTS MV E/A ratio:  3.12         Systemic VTI:  0.10 m                             Systemic Diam: 1.80 cm Toribio Fuel MD Electronically signed by Toribio Fuel MD Signature Date/Time: 03/22/2024/6:58:00 PM    Final     Medications:   Current Medications:  furosemide   80 mg Intravenous BID   hydrALAZINE   25 mg Oral Q8H   isosorbide  dinitrate  10 mg Oral TID    Infusions:  heparin  1,000 Units/hr (03/22/24 1641)   Patient Profile   John Savage is a 43 y.o. male with history of uncontrolled HTN. Now admitted with hypertensive urgency and acute systolic heart failure.   Assessment/Plan  Acute systolic heart failure - Echo 0/78 EF 50-55% - Echo 03/22/24 EF <20%, large mobile LV apical  thrombus noted, LV with GHK, mild concentric LVH, GIIIDD, RV severely reduced, LA mod dilated, RA mildly dilated, mild MR - Need to r/o iCM. Otherwise suspect hypertensive CM vs tachy-mediated CM.  - NYHA IV on admission - GDMT limited with renal function - Volume overloaded on exam. Will increase lasix  to 120 IV BID. Depending on BMET this afternoon will order 2.5 metolazone if K stable.  - Place PICC to better guide diuresis and follow co-ox/CVP - Avoid BB at this time with acute exacerbation - Increase hydral to 75 mg  TID and add Imdur  60 mg daily.  - Will need L/RHC (vs coronary CT) once diuresed. +/- cMRI - Strict I&O, daily weights  Hypertensive Urgency - SBP 200s on admission - Med changes at above - May need repeat renal dopplers. 2020 they were (-) - Will need referral to HTN clinic at discharge  Atrial fibrillation/flutter - New this admission - Continue hep gtt - Looks like ST this morning, still with rates in 120s. - Check EKG  -Check TSH  NSTEMI - suspect type II in the setting of acute HFrEF - Continue hep gtt - Plan for ischemic eval once renal function improves - Check lipid panel, start statin - Denies current chest pain  LV Thrombus - Continue heparin  gtt - Will need eliquis  at discharge  AKI  - SCr up to 2.5 on admission. Last baseline was around 1.1 2 years ago - Follow with diuresis - Avoid hypotension  Elevated LFTs - Suspect 2/2 congestion - Follow  Hypokalemia - K 2.8 with diuresis - Aggressively repleting - BMET this afternoon.     Length of Stay: 2  Beckey LITTIE Coe, NP  03/23/2024, 7:15 AM  Advanced Heart Failure Team Pager [phone] (M-F; 7a - 5p)   Please visit [email or web address]: For overnight coverage please call cardiology fellow first. If fellow not available call Shock/ECMO MD on call.  For ECMO / Mechanical Support (Impella, IABP, LVAD) issues call Shock / ECMO MD on call.   Patient seen and examined with the above-signed Advanced  Practice Provider and/or Housestaff. I personally reviewed laboratory data, imaging studies and relevant notes. I independently examined the patient and formulated the important aspects of the plan. I have edited the note to reflect any of my changes or salient points. I have personally discussed the plan with the patient and/or family.   43 y/o male as above with poorly controlled HTN admited with new onset systolic HF in setting of atrial tach/atypical flutter with RVR of unknown duration. Course c/b AKI and large mobile LV thrombus  Starting to feel better.   Echo EF < 20% severe RV HK + apical clot  Now in SR  General:  Sitting up in bed. No resp difficulty HEENT: normal Neck: supple. JVP to ear Cor: tachy regualr. No rubs, gallops or murmurs. Lungs: clear Abdomen: soft, nontender, nondistended.Good bowel sounds. Extremities: no cyanosis, clubbing, rash, 2+ edema Neuro: alert & orientedx3, cranial nerves grossly intact. moves all 4 extremities w/o difficulty. Affect pleasant  Suspect NICM due to HTN vs tachy-mediated. That said has multiple CRFs.   Place PICC to follow CVP and co-ox. Increase IV diuretics. If AFL recurs start amio. Continue heparin . Control BP.   Eventual R/L cath if Scr improves sufficiently. WiIl also need cMRI. Low threshold for milrinone.   Toribio Fuel, MD  12:04 AM     [1]  Allergies Allergen Reactions   Novocain [Procaine] Other (See Comments)    Unknown reaction   Porcine (Pork) Protein-Containing Drug Products Other (See Comments)    Religious reason   "

## 2024-03-23 NOTE — Progress Notes (Signed)
 Heart Failure Navigator Progress Note  Assessed for Heart & Vascular TOC clinic readiness.  Patient does not meet criteria due to Advanced Heart Failure Team consulted..   Navigator will sign off at this time.    Stephane Haddock, BSN, Scientist, clinical (histocompatibility and immunogenetics) Only

## 2024-03-24 DIAGNOSIS — R7303 Prediabetes: Secondary | ICD-10-CM

## 2024-03-24 DIAGNOSIS — N179 Acute kidney failure, unspecified: Secondary | ICD-10-CM

## 2024-03-24 DIAGNOSIS — I5023 Acute on chronic systolic (congestive) heart failure: Secondary | ICD-10-CM

## 2024-03-24 DIAGNOSIS — I10 Essential (primary) hypertension: Secondary | ICD-10-CM

## 2024-03-24 DIAGNOSIS — D72829 Elevated white blood cell count, unspecified: Secondary | ICD-10-CM | POA: Diagnosis not present

## 2024-03-24 DIAGNOSIS — E782 Mixed hyperlipidemia: Secondary | ICD-10-CM | POA: Diagnosis not present

## 2024-03-24 DIAGNOSIS — I480 Paroxysmal atrial fibrillation: Secondary | ICD-10-CM | POA: Diagnosis not present

## 2024-03-24 LAB — BASIC METABOLIC PANEL WITH GFR
Anion gap: 13 (ref 5–15)
Anion gap: 2 — ABNORMAL LOW (ref 5–15)
Anion gap: 12 (ref 5–15)
BUN: 24 mg/dL — ABNORMAL HIGH (ref 6–20)
BUN: 16 mg/dL (ref 6–20)
BUN: 25 mg/dL — ABNORMAL HIGH (ref 6–20)
CO2: 33 mmol/L — ABNORMAL HIGH (ref 22–32)
CO2: 19 mmol/L — ABNORMAL LOW (ref 22–32)
CO2: 31 mmol/L (ref 22–32)
Calcium: 9.2 mg/dL (ref 8.9–10.3)
Calcium: 4.4 mg/dL — CL (ref 8.9–10.3)
Calcium: 9.1 mg/dL (ref 8.9–10.3)
Chloride: 94 mmol/L — ABNORMAL LOW (ref 98–111)
Chloride: 116 mmol/L — ABNORMAL HIGH (ref 98–111)
Chloride: 98 mmol/L (ref 98–111)
Creatinine, Ser: 2.29 mg/dL — ABNORMAL HIGH (ref 0.61–1.24)
Creatinine, Ser: 1.2 mg/dL (ref 0.61–1.24)
Creatinine, Ser: 2.38 mg/dL — ABNORMAL HIGH (ref 0.61–1.24)
GFR, Estimated: 36 mL/min — ABNORMAL LOW
GFR, Estimated: >60 mL/min
GFR, Estimated: 34 mL/min — ABNORMAL LOW
Glucose, Bld: 181 mg/dL — ABNORMAL HIGH (ref 70–99)
Glucose, Bld: 76 mg/dL (ref 70–99)
Glucose, Bld: 145 mg/dL — ABNORMAL HIGH (ref 70–99)
Potassium: 2.8 mmol/L — ABNORMAL LOW (ref 3.5–5.1)
Potassium: >7.5 mmol/L (ref 3.5–5.1)
Potassium: 3.5 mmol/L (ref 3.5–5.1)
Sodium: 139 mmol/L (ref 135–145)
Sodium: 137 mmol/L (ref 135–145)
Sodium: 140 mmol/L (ref 135–145)

## 2024-03-24 LAB — HEPARIN LEVEL (UNFRACTIONATED): Heparin Unfractionated: 0.58 [IU]/mL (ref 0.30–0.70)

## 2024-03-24 LAB — CBC
HCT: 42.9 % (ref 39.0–52.0)
Hemoglobin: 13.8 g/dL (ref 13.0–17.0)
MCH: 29.2 pg (ref 26.0–34.0)
MCHC: 32.2 g/dL (ref 30.0–36.0)
MCV: 90.7 fL (ref 80.0–100.0)
Platelets: 384 K/uL (ref 150–400)
RBC: 4.73 MIL/uL (ref 4.22–5.81)
RDW: 14.6 % (ref 11.5–15.5)
WBC: 13 K/uL — ABNORMAL HIGH (ref 4.0–10.5)
nRBC: 0 % (ref 0.0–0.2)

## 2024-03-24 LAB — COOXEMETRY PANEL
Carboxyhemoglobin: 2.2 % — ABNORMAL HIGH (ref 0.5–1.5)
Methemoglobin: <0.7 % (ref 0.0–1.5)
O2 Saturation: 70.6 %
Total hemoglobin: 14 g/dL (ref 12.0–16.0)

## 2024-03-24 LAB — MAGNESIUM: Magnesium: 2.2 mg/dL (ref 1.7–2.4)

## 2024-03-24 MED ORDER — ISOSORBIDE MONONITRATE ER 60 MG PO TB24
90.0000 mg | ORAL_TABLET | Freq: Every day | ORAL | Status: DC
  Administered 2024-03-24 – 2024-03-25 (×2): 90 mg via ORAL
  Filled 2024-03-24 (×2): qty 1

## 2024-03-24 MED ORDER — HYDRALAZINE HCL 50 MG PO TABS
100.0000 mg | ORAL_TABLET | Freq: Three times a day (TID) | ORAL | Status: DC
  Administered 2024-03-24 (×2): 100 mg via ORAL
  Filled 2024-03-24 (×2): qty 2

## 2024-03-24 MED ORDER — POTASSIUM CHLORIDE 10 MEQ/100ML IV SOLN
10.0000 meq | INTRAVENOUS | Status: AC
  Filled 2024-03-24 (×2): qty 100

## 2024-03-24 MED ORDER — POTASSIUM CHLORIDE CRYS ER 20 MEQ PO TBCR
40.0000 meq | EXTENDED_RELEASE_TABLET | Freq: Once | ORAL | Status: AC
  Administered 2024-03-24: 40 meq via ORAL
  Filled 2024-03-24: qty 2

## 2024-03-24 MED ORDER — POTASSIUM CHLORIDE CRYS ER 20 MEQ PO TBCR: 40.0000 meq | EXTENDED_RELEASE_TABLET | ORAL | Status: DC

## 2024-03-24 MED ORDER — EMPAGLIFLOZIN 10 MG PO TABS
10.0000 mg | ORAL_TABLET | Freq: Every day | ORAL | Status: DC
  Administered 2024-03-24 – 2024-03-27 (×4): 10 mg via ORAL
  Filled 2024-03-24 (×4): qty 1

## 2024-03-24 MED ORDER — FUROSEMIDE 40 MG PO TABS
40.0000 mg | ORAL_TABLET | Freq: Every day | ORAL | Status: DC
  Administered 2024-03-24 – 2024-03-27 (×4): 40 mg via ORAL
  Filled 2024-03-24 (×4): qty 1

## 2024-03-24 MED ORDER — POTASSIUM CHLORIDE 10 MEQ/100ML IV SOLN
10.0000 meq | INTRAVENOUS | Status: AC
  Administered 2024-03-24 (×3): 10 meq via INTRAVENOUS
  Filled 2024-03-24 (×2): qty 100

## 2024-03-24 NOTE — Assessment & Plan Note (Addendum)
 Currently sinus rhythm, further anticoagulation with apixaban 

## 2024-03-24 NOTE — Progress Notes (Signed)
" °  Progress Note   Patient: John Savage FMW:[id number] DOB: July 07, 1980 DOA: 03/21/2024     3 DOS: the patient was seen and examined on 03/24/2024   Brief hospital course: Mr. Larkin was admitted to the hospital with the working diagnosis of heart failure exacerbation.   43/M with history of hypertension presented to the ED with progressive swelling, shortness of breath and elevated BP.  In the ER he was hypertensive, hemoglobin 14, troponin 1411, BNP 21K, creatinine 2.4, chest x-ray with cardiomegaly and pulmonary vascular congestion.  Echocardiogram with reduced LV systolic function, with large LV thrombus.   Assessment and Plan: * Acute on chronic systolic CHF (congestive heart failure) (HCC) Echocardiogram with reduced LV systolic function with EF < 20%, global hypokinesis, mid LVH, grade III diastolic dysfunction with (restrictive pattern), RV systolic function with severe reduction, RVSP 41.9 mmHg, LA with moderate dilatation, RA with mild dilatation, mild mitral valve regurgitation, large mobile LV apical thrombus 1.5 x 2.2 cm.   Urine output 3,525 ml Systolic blood pressure 150 to 160 and diastolic blood pressure 90 to 899 mmHg.   Plan to continue afterload reduction with hydralazine  and isosorbide .  SGLT 2 inh  Loop diuretic with furosemide  40 mg po daily.  LV thrombus, continue anticoagulation with IV heparin , pending completion of cardiac evaluation, with cardiac catheterization    Essential hypertension Hypertensive emergency.   Continue to have elevated diastolic blood pressure Continue with hydralazine  and isosorbide .   Hyperlipidemia Continue with atorvastatin    Paroxysmal atrial fibrillation (HCC) Currently sinus rhythm, continue anticoagulation with IV heparin    AKI (acute kidney injury) Hypokalemia.   Renal function with serum cr at 2,29 with K at 2,8 and serum bicarbonate at 33 Na 139 and Mg 2.2   Continue K correction with Kcl  Close follow  up renal function and electrolytes.   Pre-diabetes Hgb A1c at 6,3         Subjective: Patient with no chest pain, dyspnea has been improving, no palpitations   Physical Exam: Vitals:   03/23/24 2325 03/24/24 0358 03/24/24 0819 03/24/24 1202  BP: (!) 155/113 (!) 163/113 (!) 162/116 (!) 149/104  Pulse: (!) 114 (!) 119 (!) 122 (!) 114  Resp: 17 19 20 17   Temp: 98.9 F (37.2 C) 99.3 F (37.4 C) 98.8 F (37.1 C) 98.8 F (37.1 C)  TempSrc: Oral Oral Oral Oral  SpO2: 96% 95% 97% 97%  Weight:  67.8 kg    Height:       Neurology awake and alert ENT with mild pallor with no icterus Cardiovascular with S1 and S2 present, regular and tachycardic with no gallops or rubs, positive systolic murmur at the left lower sternal border No JVD Respiratory with no rales or wheezing, no rhonchi\ Abdomen with no distention, soft and non tender No lower extremity edema   Data Reviewed:    Family Communication: no family at the bedside   Disposition: Status is: Inpatient Remains inpatient appropriate because: heart failure   Planned Discharge Destination: Home    Author: Elidia Toribio Furnace, MD 03/24/2024 3:53 PM  For on call review [email or web address].  "

## 2024-03-24 NOTE — Assessment & Plan Note (Signed)
 Hgb A1c at 6,3

## 2024-03-24 NOTE — Assessment & Plan Note (Addendum)
 Hypokalemia.   Today with renal function improving with serum cr at 1,98 with K at 3,6 and serum bicarbonate at 23 Na 135 and Mg 2.3   Continue diuresis with furosemide , SGLT 2 inh and spironolactone  Patient will have 20 meq kcl today prior to discharge Follow up renal function and electrolytes as outpatient

## 2024-03-24 NOTE — Hospital Course (Addendum)
 Mr. John Savage was admitted to the hospital with the working diagnosis of heart failure exacerbation.   81 male with past medical history of hypertension who presented with dyspnea.  Reported several weeks of worsening lower extremity edema, along with dyspnea and intermittent chest pain. On the day of admission he was evaluated at urgent care, he was found with severe hypertension, 220/170 with HR 124 and was referred to the ED.  On his initial physical examination his blood pressure was 148/117, HR 102, RR 25 and 02 saturation 100%  Lungs with no wheezing or rhonchi, heart with S1 and S2 present and tachycardic, with no rubs or gallops, abdomen soft and not distended, positive lower extremity edema.    Na 130, K 3.0 Cl 101 bicarbonate 21 glucose 145 bun 33 cr 2,46  AST 65 ALT 90  High sensitive troponin 1,286  Wbc 9,6 hgb 13.7 plt 395  Respiratory viral panel negative   Chest radiograph with cardiomegaly, bilateral hilar vascular congestion and bilateral cephalization of the vasculature. No effusions or infiltrates.   EKG 124 bpm, normal axis, normal intervals, qtc 436, sinus rhythm with poor RR wave progression, no significant ST segment or T wave changes, positive LVH.   Patient was placed  on IV furosemide  for diuresis and antihypertensive agents for blood pressure control.   Echocardiogram with reduced LV systolic function, with large LV thrombus.   12/25 improved volume status, but continue uncontrolled hypertension.  12/26 placed on entresto  and spironolactone   12/27 blood pressure and renal function more stable

## 2024-03-24 NOTE — Plan of Care (Signed)

## 2024-03-24 NOTE — Assessment & Plan Note (Signed)
 Hypertensive emergency.  Resistant hypertension.   Continue to have elevated diastolic blood pressure Continue with hydralazine  and isosorbide , continue to titrate dose Considering hypokalemia, may have hyperaldosteronism Will check renin aldo levels.

## 2024-03-24 NOTE — Assessment & Plan Note (Signed)
 Continue with atorvastatin

## 2024-03-24 NOTE — Progress Notes (Signed)
 ANTICOAGULATION CONSULT NOTE  Pharmacy Consult for Heparin  Indication: LV Thrombus  Allergies[1]  Patient Measurements: Height: 5' 5 (165.1 cm) Weight: 67.8 kg (149 lb 8 oz) IBW/kg (Calculated) : 61.5 Heparin  Dosing Weight: 79.1 kg  Vital Signs: Temp: 98.8 F (37.1 C) (12/24 0819) Temp Source: Oral (12/24 0819) BP: 162/116 (12/24 0819) Pulse Rate: 122 (12/24 0819)  Labs: Recent Labs    03/22/24 0500 03/22/24 1232 03/23/24 0357 03/23/24 1535 03/24/24 0536  HGB 13.7  --  13.3  --  13.8  HCT 41.4  --  40.4  --  42.9  PLT 379  --  388  --  384  LABPROT 15.6*  --   --   --   --   INR 1.2  --   --   --   --   HEPARINUNFRC 0.80* 0.57 0.42  --  0.58  CREATININE 2.25*  --  2.38* 2.28* 2.29*    Estimated Creatinine Clearance: 36.6 mL/min (A) (by C-G formula based on SCr of 2.29 mg/dL (H)).   Medical History: Past Medical History:  Diagnosis Date   Hypertension    Assessment: 83 yom presenting with ankle/leg swelling and high blood pressure. Bedside US  by cardiology w/ LV Thrombus. Heparin  per pharmacy consult placed for LV thrombus. Patient is not on anticoagulation prior to arrival.  Heparin  level remains therapeutic at 0.58 on 1000 units/hr. Hgb (13.8), PLT (384) WNL. No bleeding observed, heparin  running with no issues.  Goal of Therapy:  Heparin  level 0.3-0.7 units/ml Monitor platelets by anticoagulation protocol: Yes   Plan:  Continue heparin  infusion at 1000 units/hr Monitor daily heparin  level Monitor Hgb, PLT, s/sx of bleeding  Izetta Carl, PharmD PGY1 Pharmacy Resident        [1]  Allergies Allergen Reactions   Novocain [Procaine] Other (See Comments)    Unknown reaction   Porcine (Pork) Protein-Containing Drug Products Other (See Comments)    Religious reason

## 2024-03-24 NOTE — Progress Notes (Addendum)
 "    Advanced Heart Failure Rounding Note  Cardiologist: [name], MD  [facility] Cardiologist: Dr. Cherrie Chief Complaint: HTN Patient Profile   John Savage is a 43 y.o. male with history of uncontrolled HTN. Now admitted with hypertensive urgency and acute systolic heart failure.   Subjective:    Coox 71%. CVP 7-8. SBP 140-160s. 3.5L UOP. Weight down 10 lbs. sCr 2.38>2.29. K 2.8  WBC 9>11>13. Tm 99.24F.   Ambulating in the room. No SOB, CP or dizziness.   Objective:    Weight Range: 67.8 kg Body mass index is 24.88 kg/m.   Vital Signs:   Temp:  [97.7 F (36.5 C)-99.3 F (37.4 C)] 99.3 F (37.4 C) (12/24 0358) Pulse Rate:  [100-119] 119 (12/24 0358) Resp:  [16-19] 19 (12/24 0358) BP: (123-163)/(98-113) 163/113 (12/24 0358) SpO2:  [94 %-96 %] 95 % (12/24 0358) Weight:  [67.8 kg] 67.8 kg (12/24 0358) Last BM Date : 03/22/24  Weight change: Filed Weights   03/21/24 1645 03/22/24 1210 03/24/24 0358  Weight: 84.4 kg 73.1 kg 67.8 kg   Intake/Output:  Intake/Output Summary (Last 24 hours) at 03/24/2024 0749 Last data filed at 03/24/2024 0501 Gross per 24 hour  Intake 782.88 ml  Output 3525 ml  Net -2742.12 ml    Physical Exam   General:  Well appearing.   Cor: No murmurs. JVD flat.  Lungs: clear Extremities: no edema   Telemetry   ST 110-120s (personally reviewed)  Labs    CBC Recent Labs    03/23/24 0357 03/24/24 0536  WBC 11.3* 13.0*  HGB 13.3 13.8  HCT 40.4 42.9  MCV 88.6 90.7  PLT 388 384   Basic Metabolic Panel Recent Labs    87/76/74 0357 03/23/24 1535 03/24/24 0536  NA 139 138 139  K 2.6* 3.8 2.8*  CL 98 96* 94*  CO2 29 28 33*  GLUCOSE 149* 184* 181*  BUN 36* 29* 24*  CREATININE 2.38* 2.28* 2.29*  CALCIUM  8.6* 9.1 9.2  MG 2.0  --  2.2   Liver Function Tests Recent Labs    03/21/24 1655  AST 65*  ALT 90*  ALKPHOS 117  BILITOT 0.8  PROT 6.1*  ALBUMIN 3.6   ProBNP (last 3 results) Recent Labs     03/21/24 1647  PROBNP 21,140.0*   Hemoglobin A1C Recent Labs    03/23/24 0357  HGBA1C 6.3*   Fasting Lipid Panel Recent Labs    03/23/24 0826  CHOL 192  HDL 43  LDLCALC 131*  TRIG 93  CHOLHDL 4.5   Medications:    Scheduled Medications:  atorvastatin   80 mg Oral Daily   Chlorhexidine  Gluconate Cloth  6 each Topical Daily   hydrALAZINE   75 mg Oral Q8H   isosorbide  mononitrate  60 mg Oral Daily   ondansetron  (ZOFRAN ) IV  4 mg Intravenous Once   sodium chloride  flush  10-40 mL Intracatheter Q12H    Infusions:  furosemide  Stopped (03/23/24 1854)   heparin  1,000 Units/hr (03/24/24 0501)    PRN Medications: acetaminophen  **OR** acetaminophen , ondansetron  **OR** ondansetron  (ZOFRAN ) IV, oxyCODONE , sodium chloride  flush  Assessment/Plan   Acute systolic heart failure - Echo 0/78 EF 50-55% - Echo 03/22/24 EF <20%, large mobile LV apical thrombus noted, LV with GHK, mild concentric LVH, GIIIDD, RV severely reduced, LA mod dilated, RA mildly dilated, mild MR - Need to r/o iCM. Otherwise suspect hypertensive CM vs tachy-mediated CM.  - NYHA IV on admission - GDMT limited with renal function - Coox  71%. CVP 8-9 - Diuresed 10 lbs. Start Lasix  40 mg daily. Agressive replace K - start jardiance  - increase hydral to 100 mg tid, increase imdur  90 mg daily - avoid BB at this time with acute exacerbation - needs L/RHC (vs coronary CT) once diuresed. +/- cMRI, will discuss with Dr. Cherrie - strict I&O, daily weights   Hypertensive Urgency - SBP 200s on admission - Med changes at above - May need repeat renal dopplers. 2020 they were (-) - referral to HTN clinic at discharge   Atrial fibrillation/flutter - New this admission - Continue hep gtt, until need for cath determined - Again looks like ST this morning, still with rates in 120s. - TSH ok  NSTEMI - suspect type II in the setting of acute HFrEF - Continue hep gtt - Plan for ischemic eval once renal function  improves - LDL 131, continue statin - no chest pain   LV Thrombus - Continue heparin  gtt - Will need eliquis at discharge   AKI  - SCr up to 2.5 on admission. Last baseline was around 1.1, 2 years ago - Renal US  2020 showed 1-59% B/L stenosis - Follow with diuresis - Avoid hypotension   Elevated LFTs - Suspect 2/2 congestion - Follow   Hypokalemia - K 2.8 with diuresis - Aggressively repleting - BMET this afternoon   Length of Stay: 3  Jordan Lee, NP  03/24/2024, 7:49 AM  Advanced Heart Failure Team Pager [phone] (M-F; 7a - 5p)   Please visit [email or web address]: For overnight coverage please call cardiology fellow first. If fellow not available call Shock/ECMO MD on call.  For ECMO / Mechanical Support (Impella, IABP, LVAD) issues call Shock / ECMO MD on call.   Patient seen and examined with the above-signed Advanced Practice Provider and/or Housestaff. I personally reviewed laboratory data, imaging studies and relevant notes. I independently examined the patient and formulated the important aspects of the plan. I have edited the note to reflect any of my changes or salient points. I have personally discussed the plan with the patient and/or family.  Feels much better. BP remains high. CVP and co-ox look good. On heparin  for large LV thrombus  Scr has leveled out at 2.3  General:  Lying in bed. No resp difficulty HEENT: normal Neck: supple. no JVD.  Cor: Regular rate & rhythm. No rubs, gallops or murmurs. Lungs: clear Abdomen: soft, nontender, nondistended.Good bowel sounds. Extremities: no cyanosis, clubbing, rash, edema Neuro: alert & orientedx3, cranial nerves grossly intact. moves all 4 extremities w/o difficulty. Affect pleasant  Continue to titrate GDMT and HTN regimen. Ideally need R/L cath on Friday but Scr may not permit. If SCr > 2.0 will proceed with cMRI instead of cath. Continue heparin  for now.   Toribio Cherrie, MD  6:08 PM    "

## 2024-03-24 NOTE — Assessment & Plan Note (Addendum)
 Echocardiogram with reduced LV systolic function with EF < 20%, global hypokinesis, mid LVH, grade III diastolic dysfunction with (restrictive pattern), RV systolic function with severe reduction, RVSP 41.9 mmHg, LA with moderate dilatation, RA with mild dilatation, mild mitral valve regurgitation, large mobile LV apical thrombus 1.5 x 2.2 cm.   12/26 cardiac MRI  Severe asymmetric hypertrophy of the basal infero septum up to 1,7 cm. EF 32 %, with global hypokinesis.  Normal RV and no significant valvular disease.  Apical LV thrombus 1.3 to 1.2 cm Non ischemic cardiomyopathy, hypertrophic.   Patient was placed on IV furosemide  for diuresis, negative fluid balance was achieved, -5,570 ml, with significant improvement in his symptoms.   Continue blood pressure control with entresto  (increased dose prior to his discharge), spironolactone , hydralazine  and isosorbide , Continue with SGLT 2 inh  Loop diuretic with furosemide  40 mg po daily.  Limited therapy due to acutely reduced GFR.   LV thrombus, patient was placed on IV heparin  for anticoagulation, transitioned to oral apixaban  prior to his discharge.

## 2024-03-25 DIAGNOSIS — I10 Essential (primary) hypertension: Secondary | ICD-10-CM | POA: Diagnosis not present

## 2024-03-25 DIAGNOSIS — I5023 Acute on chronic systolic (congestive) heart failure: Secondary | ICD-10-CM | POA: Diagnosis not present

## 2024-03-25 DIAGNOSIS — I480 Paroxysmal atrial fibrillation: Secondary | ICD-10-CM | POA: Diagnosis not present

## 2024-03-25 DIAGNOSIS — E782 Mixed hyperlipidemia: Secondary | ICD-10-CM | POA: Diagnosis not present

## 2024-03-25 LAB — CBC
HCT: 39.9 % (ref 39.0–52.0)
Hemoglobin: 13 g/dL (ref 13.0–17.0)
MCH: 29.3 pg (ref 26.0–34.0)
MCHC: 32.6 g/dL (ref 30.0–36.0)
MCV: 89.9 fL (ref 80.0–100.0)
Platelets: 374 K/uL (ref 150–400)
RBC: 4.44 MIL/uL (ref 4.22–5.81)
RDW: 14.4 % (ref 11.5–15.5)
WBC: 17.4 K/uL — ABNORMAL HIGH (ref 4.0–10.5)
nRBC: 0 % (ref 0.0–0.2)

## 2024-03-25 LAB — COOXEMETRY PANEL
Carboxyhemoglobin: 3.6 % — ABNORMAL HIGH (ref 0.5–1.5)
Carboxyhemoglobin: 4 % — ABNORMAL HIGH (ref 0.5–1.5)
Methemoglobin: 0.7 % (ref 0.0–1.5)
Methemoglobin: <0.7 % (ref 0.0–1.5)
O2 Saturation: 85.7 %
O2 Saturation: 97.7 %
Total hemoglobin: 12.8 g/dL (ref 12.0–16.0)
Total hemoglobin: 12.3 g/dL (ref 12.0–16.0)

## 2024-03-25 LAB — BASIC METABOLIC PANEL WITH GFR
Anion gap: 12 (ref 5–15)
BUN: 25 mg/dL — ABNORMAL HIGH (ref 6–20)
CO2: 31 mmol/L (ref 22–32)
Calcium: 9.3 mg/dL (ref 8.9–10.3)
Chloride: 95 mmol/L — ABNORMAL LOW (ref 98–111)
Creatinine, Ser: 2.35 mg/dL — ABNORMAL HIGH (ref 0.61–1.24)
GFR, Estimated: 35 mL/min — ABNORMAL LOW
Glucose, Bld: 221 mg/dL — ABNORMAL HIGH (ref 70–99)
Potassium: 3.6 mmol/L (ref 3.5–5.1)
Sodium: 137 mmol/L (ref 135–145)

## 2024-03-25 LAB — MAGNESIUM: Magnesium: 2 mg/dL (ref 1.7–2.4)

## 2024-03-25 LAB — HEPARIN LEVEL (UNFRACTIONATED): Heparin Unfractionated: 0.62 [IU]/mL (ref 0.30–0.70)

## 2024-03-25 MED ORDER — SACUBITRIL-VALSARTAN 24-26 MG PO TABS
1.0000 | ORAL_TABLET | Freq: Two times a day (BID) | ORAL | Status: DC
  Administered 2024-03-25 – 2024-03-27 (×4): 1 via ORAL
  Filled 2024-03-25 (×4): qty 1

## 2024-03-25 MED ORDER — HYDRALAZINE HCL 50 MG PO TABS
100.0000 mg | ORAL_TABLET | Freq: Four times a day (QID) | ORAL | Status: DC
  Administered 2024-03-25 – 2024-03-26 (×7): 100 mg via ORAL
  Filled 2024-03-25 (×7): qty 2

## 2024-03-25 MED ORDER — NITROGLYCERIN 2 % TD OINT
1.0000 [in_us] | TOPICAL_OINTMENT | Freq: Once | TRANSDERMAL | Status: AC
  Administered 2024-03-25: 1 [in_us] via TOPICAL
  Filled 2024-03-25: qty 1

## 2024-03-25 MED ORDER — POTASSIUM CHLORIDE CRYS ER 20 MEQ PO TBCR
40.0000 meq | EXTENDED_RELEASE_TABLET | Freq: Once | ORAL | Status: AC
  Administered 2024-03-25: 40 meq via ORAL
  Filled 2024-03-25: qty 2

## 2024-03-25 MED ORDER — MORPHINE SULFATE (PF) 2 MG/ML IV SOLN
2.0000 mg | INTRAVENOUS | Status: AC | PRN
  Administered 2024-03-25: 2 mg via INTRAVENOUS
  Filled 2024-03-25: qty 1

## 2024-03-25 NOTE — Progress Notes (Signed)
 ANTICOAGULATION CONSULT NOTE  Pharmacy Consult for Heparin  Indication: LV Thrombus  Allergies[1]  Patient Measurements: Height: 5' 5 (165.1 cm) Weight: 69.2 kg (152 lb 8 oz) IBW/kg (Calculated) : 61.5 Heparin  Dosing Weight: 79.1 kg  Vital Signs: Temp: 99.3 F (37.4 C) (12/25 0357) Temp Source: Oral (12/25 0357) BP: 164/116 (12/25 0620) Pulse Rate: 130 (12/25 0357)  Labs: Recent Labs    03/23/24 0357 03/23/24 1535 03/24/24 0536 03/24/24 1551 03/24/24 1657 03/25/24 0636  HGB 13.3  --  13.8  --   --  13.0  HCT 40.4  --  42.9  --   --  39.9  PLT 388  --  384  --   --  374  HEPARINUNFRC 0.42  --  0.58  --   --  0.62  CREATININE 2.38*   < > 2.29* 1.20 2.38* 2.35*   < > = values in this interval not displayed.    Estimated Creatinine Clearance: 35.6 mL/min (A) (by C-G formula based on SCr of 2.35 mg/dL (H)).   Medical History: Past Medical History:  Diagnosis Date   Hypertension    Assessment: 89 yom presenting with ankle/leg swelling and high blood pressure. Bedside US  by cardiology w/ LV Thrombus. Heparin  per pharmacy consult placed for LV thrombus. Patient is not on anticoagulation prior to arrival.  Heparin  level 0.62 is therapeutic with heparin  running at 1000 units/hr. Hgb (13.0) and PLTs (374) are stable. Patient renal function is stable. Per RN, no report of pauses, issues with the line, or signs of bleeding.    Goal of Therapy:  Heparin  level 0.3-0.7 units/ml Monitor platelets by anticoagulation protocol: Yes   Plan:  Continue heparin  at 1000 units/hr Monitor daily heparin  levels and CBC Monitor for any signs/symptoms of bleeding  Thank you for allowing pharmacy to be involved with this patient's care.  Mendel Barter, PharmD PGY1 Clinical Pharmacist [facility]  03/25/2024 7:16 AM    [1]  Allergies Allergen Reactions   Novocain [Procaine] Other (See Comments)    Unknown reaction   Porcine (Pork) Protein-Containing Drug Products  Other (See Comments)    Religious reason

## 2024-03-25 NOTE — Progress Notes (Signed)
 " Progress Note   Patient: John Savage DOB: 05-Oct-1980 DOA: 03/21/2024     4 DOS: the patient was seen and examined on 03/25/2024   Brief hospital course: Mr. John Savage was admitted to the hospital with the working diagnosis of heart failure exacerbation.   [name] with past medical history of hypertension who presented with dyspnea.  Reported several weeks of worsening lower extremity edema, along with dyspnea and intermittent chest pain. On the day of admission he was evaluated at urgent care, he was found with severe hypertension, 220/170 with HR 124 and was referred to the ED.  On his initial physical examination his blood pressure was 148/117, HR 102, RR 25 and 02 saturation 100%  Lungs with no wheezing or rhonchi, heart with S1 and S2 present and tachycardic, with no rubs or gallops, abdomen soft and not distended, positive lower extremity edema.    Patient was placed  on IV furosemide  for diuresis and antihypertensive agents for blood pressure control.   Echocardiogram with reduced LV systolic function, with large LV thrombus.   12/25 improved volume status, but continue uncontrolled hypertension.   Assessment and Plan: * Acute on chronic systolic CHF (congestive heart failure) (HCC) Echocardiogram with reduced LV systolic function with EF < 20%, global hypokinesis, mid LVH, grade III diastolic dysfunction with (restrictive pattern), RV systolic function with severe reduction, RVSP 41.9 mmHg, LA with moderate dilatation, RA with mild dilatation, mild mitral valve regurgitation, large mobile LV apical thrombus 1.5 x 2.2 cm.   Improved volume status.  Systolic blood pressure 170 to 180 and diastolic blood pressure 116 to 128 mmHg.  Sv02 85.7   Plan to continue afterload reduction with hydralazine  and isosorbide , increased dose to 100 mg qid and 90 mg daily respectively  SGLT 2 inh  Loop diuretic with furosemide  40 mg po daily. To consider dihydropyridine  calcium  channel blocker.  To consider low dose spironolactone  with close monitoring renal function  Limited therapy due to acutely reduced GFR.   LV thrombus, continue anticoagulation with IV heparin , pending completion of cardiac evaluation, with cardiac catheterization    Essential hypertension Hypertensive emergency.  Resistant hypertension.   Continue to have elevated diastolic blood pressure Continue with hydralazine  and isosorbide , continue to titrate dose Considering hypokalemia, may have hyperaldosteronism Will check renin aldo levels.    Hyperlipidemia Continue with atorvastatin    Paroxysmal atrial fibrillation (HCC) Currently sinus rhythm, continue anticoagulation with IV heparin    AKI (acute kidney injury) Hypokalemia.   Renal function today with serum cr at 2,35 with K at 3.6 and serum bicarbonate at 31 Na 137 and Mg 2.0   Add 40 meq Kcl today  Close follow up renal function and electrolytes.   Pre-diabetes Hgb A1c at 6,3         Subjective: Patient with no chest pain and no dyspnea, no PND, orthopnea or lower extremity edema, no chest pain   Physical Exam: Vitals:   03/25/24 0620 03/25/24 0751 03/25/24 0813 03/25/24 1100  BP: (!) 164/116 (!) 195/151 (!) 183/137 (!) 174/128  Pulse:  (!) 132    Resp: 18 18    Temp:  98.8 F (37.1 C)    TempSrc:  Oral    SpO2:  96%    Weight:      Height:       Neurology awake and alert ENT with mild pallor Cardiovascular with S1 and S2 present and regular with no gallops, rubs or murmurs No JVD Respiratory with no rales  or wheezing, no rhonchi  Abdomen soft and non tender No lower extremity edema   Data Reviewed:    Family Communication: no family at the bedside   Disposition: Status is: Inpatient Remains inpatient appropriate because: blood pressure control   Planned Discharge Destination: Home     Author: Elidia Toribio Furnace, MD 03/25/2024 2:44 PM  For on call review [email or web address].  "

## 2024-03-25 NOTE — Progress Notes (Signed)
 "    Advanced Heart Failure Rounding Note  Cardiologist: [name], MD  [facility] Cardiologist: Dr. Cherrie Chief Complaint: HTN Patient Profile   John Savage is a 43 y.o. male with history of uncontrolled HTN. Now admitted with hypertensive urgency and acute systolic heart failure.   Subjective:    Feels good. Wants to go home. Co-ox 86% (?)  CVP low  SBP remains high. TRH titrating meds  SCR remains 2.3    Objective:    Weight Range: 69.2 kg Body mass index is 25.38 kg/m.   Vital Signs:   Temp:  [98.7 F (37.1 C)-100 F (37.8 C)] 99.5 F (37.5 C) (12/25 1941) Pulse Rate:  [121-139] 121 (12/25 1629) Resp:  [18-20] 20 (12/25 1629) BP: (160-195)/(116-151) 160/121 (12/25 1939) SpO2:  [93 %-97 %] 97 % (12/25 1629) Weight:  [69.2 kg] 69.2 kg (12/25 0357) Last BM Date : 03/25/24  Weight change: Filed Weights   03/22/24 1210 03/24/24 0358 03/25/24 0357  Weight: 73.1 kg 67.8 kg 69.2 kg   Intake/Output: No intake or output data in the 24 hours ending 03/25/24 2057   Physical Exam   General: Lying flat in bed. No resp difficulty HEENT: normal Neck: supple. no JVD.  Cor: Regular tachy No rubs, gallops or murmurs. Lungs: clear Abdomen: soft, nontender, nondistended.Good bowel sounds. Extremities: no cyanosis, clubbing, rash, edema Neuro: alert & orientedx3, cranial nerves grossly intact. moves all 4 extremities w/o difficulty. Affect pleasant   Telemetry   ST 110-120s Personally reviewed  Labs    CBC Recent Labs    03/24/24 0536 03/25/24 0636  WBC 13.0* 17.4*  HGB 13.8 13.0  HCT 42.9 39.9  MCV 90.7 89.9  PLT 384 374   Basic Metabolic Panel Recent Labs    87/75/74 0536 03/24/24 1551 03/24/24 1657 03/25/24 0636  NA 139   < > 140 137  K 2.8*   < > 3.5 3.6  CL 94*   < > 98 95*  CO2 33*   < > 31 31  GLUCOSE 181*   < > 145* 221*  BUN 24*   < > 25* 25*  CREATININE 2.29*   < > 2.38* 2.35*  CALCIUM  9.2   < > 9.1 9.3  MG 2.2  --   --  2.0    < > = values in this interval not displayed.   Liver Function Tests No results for input(s): AST, ALT, ALKPHOS, BILITOT, PROT, ALBUMIN in the last 72 hours.  ProBNP (last 3 results) Recent Labs    03/21/24 1647  PROBNP 21,140.0*   Hemoglobin A1C Recent Labs    03/23/24 0357  HGBA1C 6.3*   Fasting Lipid Panel Recent Labs    03/23/24 0826  CHOL 192  HDL 43  LDLCALC 131*  TRIG 93  CHOLHDL 4.5   Medications:    Scheduled Medications:  atorvastatin   80 mg Oral Daily   Chlorhexidine  Gluconate Cloth  6 each Topical Daily   empagliflozin   10 mg Oral Daily   furosemide   40 mg Oral Daily   hydrALAZINE   100 mg Oral Q6H   isosorbide  mononitrate  90 mg Oral Daily   ondansetron  (ZOFRAN ) IV  4 mg Intravenous Once   sodium chloride  flush  10-40 mL Intracatheter Q12H    Infusions:  heparin  1,000 Units/hr (03/25/24 1939)    PRN Medications: acetaminophen  **OR** acetaminophen , ondansetron  **OR** ondansetron  (ZOFRAN ) IV, oxyCODONE , sodium chloride  flush  Assessment/Plan   Acute systolic heart failure - Echo 0/78 EF 50-55% -  Echo 03/22/24 EF <20%, large mobile LV apical thrombus noted, LV with GHK, mild concentric LVH, GIIIDD, RV severely reduced, LA mod dilated, RA mildly dilated, mild MR - Need to r/o iCM. Otherwise suspect hypertensive CM vs tachy-mediated CM.  - NYHA IV on admission - GDMT limited with renal function - Coox 786% (?) CVP low - Continue po lasix  - Continue jardiance  - Continue hydral 100 mg tid and imdur  90 mg daily - avoid BB at this time with acute exacerbation - will add Entresto  24/26 bid. Watch renal function  - May need ivabradine - Not LHC cath candidate with CKD IV. With tachycardia consider RHC  - Plan cMRI   Hypertensive Urgency - SBP 200s on admission - Med changes at above - May need repeat renal dopplers. 2020 they were (-) - referral to HTN clinic at discharge - TRH checking aldo levels may need MRA - Add Entresto      Atrial fibrillation/flutter - New this admission - Continue hep gtt, until need for cath determined - Sinus on ECG - TSH ok  NSTEMI - suspect type II in the setting of acute HFrEF - Continue hep gtt - Plan for ischemic eval once renal function improves - LDL 131, continue statin - no chest pain   LV Thrombus - Continue heparin  gtt - Will need eliquis at discharge - No change   AKI  - SCr up to 2.5 on admission. Last baseline was around 1.1, 2 years ago - Renal US  2020 showed 1-59% B/L stenosis - Scr stable 2.3  - Follow with diuresis - Avoid hypotension   Elevated LFTs - Suspect 2/2 congestion - Follow   Hypokalemia - Supp - Adding Entresto    Length of Stay: 4  Toribio Fuel, MD  03/25/2024, 8:57 PM  Advanced Heart Failure Team Pager [phone] (M-F; 7a - 5p)   Please visit [email or web address]: For overnight coverage please call cardiology fellow first. If fellow not available call Shock/ECMO MD on call.  For ECMO / Mechanical Support (Impella, IABP, LVAD) issues call Shock / ECMO MD on call.      "

## 2024-03-25 NOTE — Progress Notes (Signed)
 Orthopedic Tech Progress Note Patient Details:  John Savage 19-Feb-1981 [id number]  Ortho Devices Type of Ortho Device: Radio broadcast assistant Ortho Device/Splint Location: BLE Ortho Device/Splint Interventions: Ordered, Application, Adjustment   Post Interventions Patient Tolerated: Well Instructions Provided: Care of device, Adjustment of device  [name] 03/25/2024, 2:47 PM

## 2024-03-25 NOTE — Plan of Care (Signed)
  Problem: Clinical Measurements: Goal: Diagnostic test results will improve Outcome: Progressing   Problem: Clinical Measurements: Goal: Respiratory complications will improve Outcome: Progressing   

## 2024-03-25 NOTE — Discharge Instructions (Signed)
 Information on my medicine - ELIQUIS  (apixaban )  This medication education was reviewed with me or my healthcare representative as part of my discharge preparation.  The pharmacist that spoke with me during my hospital stay was:    Why was Eliquis  prescribed for you? Eliquis  was prescribed for you to reduce the risk of forming blood clots that can cause a stroke if you have a medical condition called atrial fibrillation (a type of irregular heartbeat) OR to reduce the risk of a blood clots forming after orthopedic surgery.  What do You need to know about Eliquis  ? Take your Eliquis  TWICE DAILY - one tablet in the morning and one tablet in the evening with or without food.  It would be best to take the doses about the same time each day.  If you have difficulty swallowing the tablet whole please discuss with your pharmacist how to take the medication safely.  Take Eliquis  exactly as prescribed by your doctor and DO NOT stop taking Eliquis  without talking to the doctor who prescribed the medication.  Stopping may increase your risk of developing a new clot or stroke.  Refill your prescription before you run out.  After discharge, you should have regular check-up appointments with your healthcare provider that is prescribing your Eliquis .  In the future your dose may need to be changed if your kidney function or weight changes by a significant amount or as you get older.  What do you do if you miss a dose? If you miss a dose, take it as soon as you remember on the same day and resume taking twice daily.  Do not take more than one dose of ELIQUIS  at the same time.  Important Safety Information A possible side effect of Eliquis  is bleeding. You should call your healthcare provider right away if you experience any of the following: Bleeding from an injury or your nose that does not stop. Unusual colored urine (red or dark brown) or unusual colored stools (red or black). Unusual bruising for  unknown reasons. A serious fall or if you hit your head (even if there is no bleeding).  Some medicines may interact with Eliquis  and might increase your risk of bleeding or clotting while on Eliquis . To help avoid this, consult your healthcare provider or pharmacist prior to using any new prescription or non-prescription medications, including herbals, vitamins, non-steroidal anti-inflammatory drugs (NSAIDs) and supplements.  This website has more information on Eliquis  (apixaban ): http://[email or web address] .com/eliquis dena

## 2024-03-25 NOTE — TOC Initial Note (Addendum)
 Transition of Care [facility]) - Initial/Assessment Note    Patient Details  Name: John Savage MRN: [id number] Date of Birth: 1980-05-29  Transition of Care [facility]) CM/SW Contact:    Justina Delcia Czar, RN Phone Number: [phone] 03/25/2024, 5:09 PM  Clinical Narrative:                 Spoke to pt and states he will need note for work.  Patient has a scale for daily weight monitoring and possesses the Living Better with Heart Failure booklet. Patient educated on the importance of daily weights, medication adherence, and following a low-sodium, heart-healthy diet.   Will schedule PCP appt at dc.   Expected Discharge Plan: Home/Self Care Barriers to Discharge: Continued Medical Work up   Patient Goals and CMS Choice Patient states their goals for this hospitalization and ongoing recovery are:: wants to remain independent    Expected Discharge Plan and Services   Discharge Planning Services: CM Consult   Living arrangements for the past 2 months: Single Family Home                   Prior Living Arrangements/Services Living arrangements for the past 2 months: Single Family Home Lives with:: Spouse, Minor Children Patient language and need for interpreter reviewed:: Yes Do you feel safe going back to the place where you live?: Yes      Need for Family Participation in Patient Care: No (Comment) Care giver support system in place?: No (comment)   Criminal Activity/Legal Involvement Pertinent to Current Situation/Hospitalization: No - Comment as needed  Activities of Daily Living   ADL Screening (condition at time of admission) Independently performs ADLs?: Yes (appropriate for developmental age) Is the patient deaf or have difficulty hearing?: No Does the patient have difficulty seeing, even when wearing glasses/contacts?: No Does the patient have difficulty concentrating, remembering, or making decisions?: No  Permission Sought/Granted Permission sought to  share information with : Case Manager, Family Supports, PCP Permission granted to share information with : Yes, Verbal Permission Granted  Share Information with NAME: Trisha Revering  Permission granted to share info w AGENCY: PCP  Permission granted to share info w Relationship: wife  Permission granted to share info w Contact Information: [phone]  Emotional Assessment Appearance:: Appears stated age Attitude/Demeanor/Rapport: Engaged Affect (typically observed): Accepting Orientation: : Oriented to Self, Oriented to Place, Oriented to  Time, Oriented to Situation   Psych Involvement: No (comment)  Admission diagnosis:  CHF (congestive heart failure) (HCC) [I50.9] Acute congestive heart failure, unspecified heart failure type [facility]) [I50.9] Patient Active Problem List   Diagnosis Date Noted   Paroxysmal atrial fibrillation (HCC) 03/24/2024   AKI (acute kidney injury) 03/24/2024   Pre-diabetes 03/24/2024   Acute on chronic systolic CHF (congestive heart failure) (HCC) 03/21/2024   Hyperlipidemia 06/27/2020   Hypertensive emergency 12/11/2019   Left ventricular hypertrophy by electrocardiogram 07/28/2018   Acute upper respiratory infection 06/07/2018   Headache 06/07/2018   Abnormal brain MRI 06/07/2018   Essential hypertension 06/06/2018   PCP:  No primary care provider on file. Pharmacy:   [id number] [address], [place] - [address] AT [facility] [address] [place] [address] Phone: [phone] Fax: [phone]  [facility] [address] [place] [address] Phone: [phone] Fax: [phone]     Social Drivers of Health (SDOH) Social History: SDOH Screenings   Food Insecurity: No Food Insecurity (03/22/2024)  Housing:  Low Risk (03/22/2024)  Transportation Needs: No Transportation Needs (03/22/2024)  Utilities: Not At Risk (03/22/2024)  Tobacco Use: Low Risk (03/21/2024)    SDOH Interventions:     Readmission Risk Interventions    03/22/2024    3:02 PM  Readmission Risk Prevention Plan  Medication Screening Complete  Transportation Screening Complete

## 2024-03-25 NOTE — Progress Notes (Addendum)
"  ° °      CROSS COVER NOTE  NAME: John Savage MRN: [id number] DOB : [birth year]    Concern as stated by nurse / staff   pt's BP is now 181/127, HR 130. This is after giving that extra dose of Hydralazine  earlier that you ordered      Pertinent findings on chart review: Patient admitted on 12/24 with acute on chronic HFrEF and hypertensive emergency Meds reviewed.  Currently on Lasix , Imdur , hydralazine   Patient Assessment    03/25/2024    3:57 AM 03/24/2024   11:58 PM 03/24/2024    7:56 PM  Vitals with BMI  Weight 152 lbs 8 oz    BMI 25.38    Systolic 181 171 868  Diastolic 127 131 898  Pulse 130 139 121     Assessment and  Interventions   Assessment:  Plan: Will order Nitropaste Not on ACE/ARB likely due to creatinine above 2 Will hold off on beta-blocker for now due to acute heart failure May consider clonidine  or switching to IV hydralazine  Continue to monitor       "

## 2024-03-26 ENCOUNTER — Other Ambulatory Visit (HOSPITAL_COMMUNITY): Payer: Self-pay

## 2024-03-26 ENCOUNTER — Inpatient Hospital Stay (HOSPITAL_COMMUNITY)

## 2024-03-26 DIAGNOSIS — E782 Mixed hyperlipidemia: Secondary | ICD-10-CM | POA: Diagnosis not present

## 2024-03-26 DIAGNOSIS — D72829 Elevated white blood cell count, unspecified: Secondary | ICD-10-CM

## 2024-03-26 DIAGNOSIS — I5023 Acute on chronic systolic (congestive) heart failure: Secondary | ICD-10-CM | POA: Diagnosis not present

## 2024-03-26 DIAGNOSIS — I480 Paroxysmal atrial fibrillation: Secondary | ICD-10-CM | POA: Diagnosis not present

## 2024-03-26 DIAGNOSIS — I10 Essential (primary) hypertension: Secondary | ICD-10-CM | POA: Diagnosis not present

## 2024-03-26 DIAGNOSIS — I422 Other hypertrophic cardiomyopathy: Secondary | ICD-10-CM

## 2024-03-26 LAB — HEPARIN LEVEL (UNFRACTIONATED): Heparin Unfractionated: 0.18 [IU]/mL — ABNORMAL LOW (ref 0.30–0.70)

## 2024-03-26 LAB — BASIC METABOLIC PANEL WITH GFR
Anion gap: 13 (ref 5–15)
BUN: 22 mg/dL — ABNORMAL HIGH (ref 6–20)
CO2: 28 mmol/L (ref 22–32)
Calcium: 9 mg/dL (ref 8.9–10.3)
Chloride: 93 mmol/L — ABNORMAL LOW (ref 98–111)
Creatinine, Ser: 2.05 mg/dL — ABNORMAL HIGH (ref 0.61–1.24)
GFR, Estimated: 41 mL/min — ABNORMAL LOW
Glucose, Bld: 214 mg/dL — ABNORMAL HIGH (ref 70–99)
Potassium: 3.4 mmol/L — ABNORMAL LOW (ref 3.5–5.1)
Sodium: 133 mmol/L — ABNORMAL LOW (ref 135–145)

## 2024-03-26 LAB — CBC
HCT: 37.6 % — ABNORMAL LOW (ref 39.0–52.0)
Hemoglobin: 12 g/dL — ABNORMAL LOW (ref 13.0–17.0)
MCH: 29 pg (ref 26.0–34.0)
MCHC: 31.9 g/dL (ref 30.0–36.0)
MCV: 90.8 fL (ref 80.0–100.0)
Platelets: 320 K/uL (ref 150–400)
RBC: 4.14 MIL/uL — ABNORMAL LOW (ref 4.22–5.81)
RDW: 14.3 % (ref 11.5–15.5)
WBC: 18.3 K/uL — ABNORMAL HIGH (ref 4.0–10.5)
nRBC: 0 % (ref 0.0–0.2)

## 2024-03-26 LAB — GLUCOSE, CAPILLARY: Glucose-Capillary: 133 mg/dL — ABNORMAL HIGH (ref 70–99)

## 2024-03-26 LAB — PROCALCITONIN: Procalcitonin: 1.28 ng/mL

## 2024-03-26 LAB — COOXEMETRY PANEL
Carboxyhemoglobin: 3.8 % — ABNORMAL HIGH (ref 0.5–1.5)
Methemoglobin: <0.7 % (ref 0.0–1.5)
O2 Saturation: 81.6 %
Total hemoglobin: 12.3 g/dL (ref 12.0–16.0)

## 2024-03-26 LAB — MAGNESIUM: Magnesium: 1.7 mg/dL (ref 1.7–2.4)

## 2024-03-26 MED ORDER — ISOSORBIDE MONONITRATE ER 60 MG PO TB24
120.0000 mg | ORAL_TABLET | Freq: Every day | ORAL | Status: DC
  Administered 2024-03-26 – 2024-03-27 (×2): 120 mg via ORAL
  Filled 2024-03-26 (×2): qty 2

## 2024-03-26 MED ORDER — HYDRALAZINE HCL 50 MG PO TABS
100.0000 mg | ORAL_TABLET | Freq: Three times a day (TID) | ORAL | Status: DC
  Administered 2024-03-26 – 2024-03-27 (×3): 100 mg via ORAL
  Filled 2024-03-26 (×3): qty 2

## 2024-03-26 MED ORDER — INSULIN ASPART 100 UNIT/ML IJ SOLN
0.0000 [IU] | Freq: Three times a day (TID) | INTRAMUSCULAR | Status: DC
  Administered 2024-03-26 – 2024-03-27 (×2): 1 [IU] via SUBCUTANEOUS
  Administered 2024-03-27: 2 [IU] via SUBCUTANEOUS
  Filled 2024-03-26 (×2): qty 1

## 2024-03-26 MED ORDER — INSULIN ASPART 100 UNIT/ML IJ SOLN
0.0000 [IU] | Freq: Every day | INTRAMUSCULAR | Status: DC
  Filled 2024-03-26: qty 1

## 2024-03-26 MED ORDER — GADOBUTROL 1 MMOL/ML IV SOLN
10.0000 mL | Freq: Once | INTRAVENOUS | Status: AC | PRN
  Administered 2024-03-26: 10 mL via INTRAVENOUS

## 2024-03-26 MED ORDER — POTASSIUM CHLORIDE CRYS ER 20 MEQ PO TBCR
40.0000 meq | EXTENDED_RELEASE_TABLET | ORAL | Status: AC
  Administered 2024-03-26 (×2): 40 meq via ORAL
  Filled 2024-03-26 (×2): qty 2

## 2024-03-26 MED ORDER — SPIRONOLACTONE 12.5 MG HALF TABLET
12.5000 mg | ORAL_TABLET | Freq: Every day | ORAL | Status: DC
  Administered 2024-03-26 – 2024-03-27 (×2): 12.5 mg via ORAL
  Filled 2024-03-26 (×2): qty 1

## 2024-03-26 MED ORDER — MAGNESIUM SULFATE 2 GM/50ML IV SOLN
2.0000 g | Freq: Once | INTRAVENOUS | Status: AC
  Administered 2024-03-26: 2 g via INTRAVENOUS
  Filled 2024-03-26: qty 50

## 2024-03-26 NOTE — Assessment & Plan Note (Addendum)
 Reactive leukocytosis, no signs of bacterial infection Wbc is trending down to 16,0 today.  No antibiotic therapy indicated.

## 2024-03-26 NOTE — Progress Notes (Addendum)
 "    Advanced Heart Failure Rounding Note  Cardiologist: [name], MD  [facility] Cardiologist: Dr. Cherrie Chief Complaint: HTN Patient Profile   John Savage is a 43 y.o. male with history of uncontrolled HTN. Now admitted with hypertensive urgency and acute systolic heart failure.   Subjective:    Feels good. Wants to go home. Co-ox 82% SBP remains high. TRH titrating meds. sCr 2.35>2.05 WBC trending up with Tm overnight 100.33F  Lying in bed. Feeling well.   Objective:    Weight Range: 67.7 kg Body mass index is 24.84 kg/m.   Vital Signs:   Temp:  [98.7 F (37.1 C)-100.1 F (37.8 C)] 98.9 F (37.2 C) (12/26 0744) Pulse Rate:  [96-124] 113 (12/26 0744) Resp:  [17-20] 17 (12/26 0744) BP: (151-189)/(107-137) 158/107 (12/26 0744) SpO2:  [94 %-97 %] 96 % (12/26 0744) Weight:  [67.7 kg] 67.7 kg (12/26 0321) Last BM Date : 03/26/24  Weight change: Filed Weights   03/24/24 0358 03/25/24 0357 03/26/24 0321  Weight: 67.8 kg 69.2 kg 67.7 kg   Intake/Output:  Intake/Output Summary (Last 24 hours) at 03/26/2024 0828 Last data filed at 03/26/2024 0700 Gross per 24 hour  Intake 633.55 ml  Output 550 ml  Net 83.55 ml    Physical Exam   General: Well appearing. No distress  Cardiac: JVP flat. No murmurs  Extremities: Warm and dry.  No peripheral edema.  Neuro: A&O x3. Affect pleasant.   Telemetry   ST 110s (personally reviewed)  Labs    CBC Recent Labs    03/25/24 0636 03/26/24 0558  WBC 17.4* 18.3*  HGB 13.0 12.0*  HCT 39.9 37.6*  MCV 89.9 90.8  PLT 374 320   Basic Metabolic Panel Recent Labs    87/74/74 0636 03/26/24 0558  NA 137 133*  K 3.6 3.4*  CL 95* 93*  CO2 31 28  GLUCOSE 221* 214*  BUN 25* 22*  CREATININE 2.35* 2.05*  CALCIUM  9.3 9.0  MG 2.0 1.7   Liver Function Tests No results for input(s): AST, ALT, ALKPHOS, BILITOT, PROT, ALBUMIN in the last 72 hours.  ProBNP (last 3 results) Recent Labs     03/21/24 1647  PROBNP 21,140.0*   Hemoglobin A1C No results for input(s): HGBA1C in the last 72 hours.  Fasting Lipid Panel No results for input(s): CHOL, HDL, LDLCALC, TRIG, CHOLHDL, LDLDIRECT in the last 72 hours.  Medications:    Scheduled Medications:  atorvastatin   80 mg Oral Daily   Chlorhexidine  Gluconate Cloth  6 each Topical Daily   empagliflozin   10 mg Oral Daily   furosemide   40 mg Oral Daily   hydrALAZINE   100 mg Oral Q6H   isosorbide  mononitrate  90 mg Oral Daily   ondansetron  (ZOFRAN ) IV  4 mg Intravenous Once   potassium chloride   40 mEq Oral Q4H   sacubitril -valsartan   1 tablet Oral BID   sodium chloride  flush  10-40 mL Intracatheter Q12H    Infusions:  heparin  1,000 Units/hr (03/25/24 1939)   magnesium  sulfate bolus IVPB      PRN Medications: acetaminophen  **OR** acetaminophen , ondansetron  **OR** ondansetron  (ZOFRAN ) IV, oxyCODONE , sodium chloride  flush  Assessment/Plan   Acute systolic heart failure - Echo 0/78 EF 50-55% - Echo 03/22/24 EF <20%, large mobile LV apical thrombus noted, LV with GHK, mild concentric LVH, GIIIDD, RV severely reduced, LA mod dilated, RA mildly dilated, mild MR - Need to r/o iCM. Otherwise suspect hypertensive CM vs tachy-mediated CM.  - NYHA IV on admission.  Euvolemic, CVP low. - GDMT limited with renal function - Coox 82%. CVP 6 - Continue lasix  40 mg daily - Continue jardiance  10 mg daily - Continue hydral 100 mg tid and increase imdur  to 120 mg daily - Continue Entresto  24/26 bid, can increase if renal function continues to improve - Add spiro 12.5 mg daily, watch Cr - avoid BB at this time with acute exacerbation - May need ivabradine - Not LHC cath candidate with CKD IV. With tachycardia consider RHC  - cMRI LVEF 32% RVEF 54% findings c/w hypertrophic CM + apical clot   Hypertensive Urgency - SBP 200s on admission - Med changes at above - May need repeat renal dopplers. 2020 they were (-) -  referral to HTN clinic at discharge - TRH checking aldo levels may need MRA - Med changes as above   Atrial fibrillation/flutter - New this admission - Continue hep gtt, until need for cath determined - Sinus on ECG - TSH ok  NSTEMI - suspect type II in the setting of acute HFrEF - Continue hep gtt - LHC on hold with Cr, cMRI scheduled - LDL 131, continue statin - no chest pain   LV Thrombus - Continue heparin  gtt - Will need eliquis at discharge - No change   AKI  - SCr up to 2.5 on admission. Last baseline was around 1.1, 2 years ago - Renal US  2020 showed 1-59% B/L stenosis - sCr stable - avoid hypotension   Elevated LFTs - Suspect 2/2 congestion - Follow   Hypokalemia - Supp for goal K>4 - add spiro  Leukocytosis - WBC trending up 18k today - low-grade fever Tm 100.47F overnight - check procal   Length of Stay: 5  Jordan Lee, NP  03/26/2024, 8:28 AM  Advanced Heart Failure Team Pager [phone] (M-F; 7a - 5p)   Please visit [email or web address]: For overnight coverage please call cardiology fellow first. If fellow not available call Shock/ECMO MD on call.  For ECMO / Mechanical Support (Impella, IABP, LVAD) issues call Shock / ECMO MD on call.   Patient seen and examined with the above-signed Advanced Practice Provider and/or Housestaff. I personally reviewed laboratory data, imaging studies and relevant notes. I independently examined the patient and formulated the important aspects of the plan. I have edited the note to reflect any of my changes or salient points. I have personally discussed the plan with the patient and/or family.  Feels good. Wants to go home  cMRI reviewed personally LVEF 32% RVEF 54% + evidence of HCM and apical clot  BP improving with current meds. Volume ok   General:  Sitting up in bed. No resp difficulty HEENT: normal Neck: supple. no JVD.  Cor: Regular rate & rhythm. No rubs, gallops or murmurs. Lungs: clear Abdomen: soft, nontender,  nondistended.Good bowel sounds. Extremities: no cyanosis, clubbing, rash, edema Neuro: alert & orientedx3, cranial nerves grossly intact. moves all 4 extremities w/o difficulty. Affect pleasant  Agree with med changes as above. cMRI reviewed.   Stable fro d.c from HF standpoint. We will arrang f/u.   TRH continuing to monitor in setting of fevers  Toribio Fuel, MD  5:46 PM   Had fever overnight  General:  Sitting up in bed. No resp difficulty HEENT: normal Neck: supple. no JVD.  Cor: Regular rate & rhythm. No rubs, gallops or murmurs. Lungs: clear Abdomen: soft, nontender, nondistended.Good bowel sounds. Extremities: no cyanosis, clubbing, rash, edema Neuro: alert & orientedx3, cranial nerves grossly intact. moves all 4 extremities  w/o difficulty. Affect pleasant  "

## 2024-03-26 NOTE — Progress Notes (Addendum)
 " Progress Note   Patient: John Savage FMW:[id number] DOB: 1981-03-04 DOA: 03/21/2024     5 DOS: the patient was seen and examined on 03/26/2024   Brief hospital course: Mr. Hershell was admitted to the hospital with the working diagnosis of heart failure exacerbation.   60 male with past medical history of hypertension who presented with dyspnea.  Reported several weeks of worsening lower extremity edema, along with dyspnea and intermittent chest pain. On the day of admission he was evaluated at urgent care, he was found with severe hypertension, 220/170 with HR 124 and was referred to the ED.  On his initial physical examination his blood pressure was 148/117, HR 102, RR 25 and 02 saturation 100%  Lungs with no wheezing or rhonchi, heart with S1 and S2 present and tachycardic, with no rubs or gallops, abdomen soft and not distended, positive lower extremity edema.    Na 130, K 3.0 Cl 101 bicarbonate 21 glucose 145 bun 33 cr 2,46  AST 65 ALT 90  High sensitive troponin 1,286  Wbc 9,6 hgb 13.7 plt 395  Respiratory viral panel negative   Chest radiograph with cardiomegaly, bilateral hilar vascular congestion and bilateral cephalization of the vasculature. No effusions or infiltrates.   EKG 124 bpm, normal axis, normal intervals, qtc 436, sinus rhythm with poor RR wave progression, no significant ST segment or T wave changes, positive LVH.   Patient was placed  on IV furosemide  for diuresis and antihypertensive agents for blood pressure control.   Echocardiogram with reduced LV systolic function, with large LV thrombus.   12/25 improved volume status, but continue uncontrolled hypertension.  12/26 placed on entresto  and spironolactone    Assessment and Plan: * Acute on chronic systolic CHF (congestive heart failure) (HCC) Echocardiogram with reduced LV systolic function with EF < 20%, global hypokinesis, mid LVH, grade III diastolic dysfunction with (restrictive pattern), RV  systolic function with severe reduction, RVSP 41.9 mmHg, LA with moderate dilatation, RA with mild dilatation, mild mitral valve regurgitation, large mobile LV apical thrombus 1.5 x 2.2 cm.   Improved volume status.  Sv02 81.6  Blood pressure is improving with addition of entresto  and spironolactone .   Continue with hydralazine  and isosorbide , Continue with SGLT 2 inh  Loop diuretic with furosemide  40 mg po daily.  Limited therapy due to acutely reduced GFR.   LV thrombus, continue anticoagulation with IV heparin , pending completion of cardiac evaluation, with cardiac catheterization    Essential hypertension Hypertensive emergency.  Resistant hypertension.   Continue blood pressure control with hydralazine , isosorbide , spironolactone  and entresto    Follow up on renin aldo levels.    Hyperlipidemia Continue with atorvastatin    Paroxysmal atrial fibrillation (HCC) Currently sinus rhythm, continue anticoagulation with IV heparin    AKI (acute kidney injury) Hypokalemia.   Renal function today with serum cr at 2,0 with K at 3,4 and serum bicarbonate at 28  Na 133 and Mg 1.7   Add 40 meq Kcl x2 and 2 g Mg sulfate  Continue furosemide , SGLT 2 inh and spironolactone   Close follow up renal function and electrolytes.   Pre-diabetes Hgb A1c at 6,3   Leukocytosis Reactive leukocytosis, no signs of bacterial infection, follow up cell count in am.,  Continue to hold on antibiotic therapy   Subjective: Patient with no chest pain and no dyspnea, no PND orthopnea or lower extremity edema   Physical Exam: Vitals:   03/26/24 0744 03/26/24 1049 03/26/24 1532 03/26/24 1535  BP: (!) 158/107 (!) 147/100  ROLLEN)  137/91  Pulse: (!) 113 (!) 117  61  Resp: 17 17  17   Temp: 98.9 F (37.2 C) 98.8 F (37.1 C) 98.8 F (37.1 C) 98.8 F (37.1 C)  TempSrc: Axillary Oral Oral Oral  SpO2: 96% 96%  97%  Weight:      Height:       Neurology awake and alert ENT with no pallor or  icterus Cardiovascular with S1 and S2 present and regular, tachycardic, with no gallops or rubs No JVD Respiratory with no rales or wheezing, no rhonchi  Abdomen with no distention, soft and non tender No lower extremity edema   Data Reviewed:    Family Communication: no family at the bedside   Disposition: Status is: Inpatient Remains inpatient appropriate because: blood pressure control   Planned Discharge Destination: Home    Author: Elidia Toribio Furnace, MD 03/26/2024 4:20 PM  For on call review [email or web address].  "

## 2024-03-26 NOTE — Plan of Care (Signed)

## 2024-03-26 NOTE — Progress Notes (Signed)
 ANTICOAGULATION CONSULT NOTE  Pharmacy Consult for Heparin  Indication: LV Thrombus  Allergies[1]  Patient Measurements: Height: 5' 5 (165.1 cm) Weight: 67.7 kg (149 lb 4.8 oz) IBW/kg (Calculated) : 61.5 Heparin  Dosing Weight: 79.1 kg  Vital Signs: Temp: 98.8 F (37.1 C) (12/26 1049) Temp Source: Oral (12/26 1049) BP: 147/100 (12/26 1049) Pulse Rate: 117 (12/26 1049)  Labs: Recent Labs    03/24/24 0536 03/24/24 1551 03/24/24 1657 03/25/24 0636 03/26/24 0558 03/26/24 1242  HGB 13.8  --   --  13.0 12.0*  --   HCT 42.9  --   --  39.9 37.6*  --   PLT 384  --   --  374 320  --   HEPARINUNFRC 0.58  --   --  0.62  --  0.18*  CREATININE 2.29*   < > 2.38* 2.35* 2.05*  --    < > = values in this interval not displayed.    Estimated Creatinine Clearance: 40.8 mL/min (A) (by C-G formula based on SCr of 2.05 mg/dL (H)).   Medical History: Past Medical History:  Diagnosis Date   Hypertension    Assessment: 43 yom presenting with ankle/leg swelling and high blood pressure. Bedside US  by cardiology w/ LV Thrombus. Heparin  per pharmacy consult placed for LV thrombus. Patient is not on anticoagulation prior to arrival.  Heparin  level is subtherapeutic at 0.18, CBC stable. Heparin  paused briefly per RN for tubing change but unclear how long - will increase conservatively and repeat in am.  Goal of Therapy:  Heparin  level 0.3-0.7 units/ml Monitor platelets by anticoagulation protocol: Yes   Plan:  Increase heparin  to 1100 units/h Repeat heparin  level with am labs  Ozell Jamaica, PharmD, BCPS, [facility] Clinical Pharmacist [phone] Please check AMION for all [facility] numbers 03/26/2024     [1]  Allergies Allergen Reactions   Novocain [Procaine] Other (See Comments)    Unknown reaction   Porcine (Pork) Protein-Containing Drug Products Other (See Comments)    Religious reason

## 2024-03-26 NOTE — Plan of Care (Signed)
 Patient wanting to go home soon.

## 2024-03-27 ENCOUNTER — Other Ambulatory Visit (HOSPITAL_COMMUNITY): Payer: Self-pay

## 2024-03-27 DIAGNOSIS — D72829 Elevated white blood cell count, unspecified: Secondary | ICD-10-CM

## 2024-03-27 DIAGNOSIS — I10 Essential (primary) hypertension: Secondary | ICD-10-CM | POA: Diagnosis not present

## 2024-03-27 DIAGNOSIS — I5023 Acute on chronic systolic (congestive) heart failure: Secondary | ICD-10-CM | POA: Diagnosis not present

## 2024-03-27 DIAGNOSIS — I480 Paroxysmal atrial fibrillation: Secondary | ICD-10-CM | POA: Diagnosis not present

## 2024-03-27 DIAGNOSIS — E782 Mixed hyperlipidemia: Secondary | ICD-10-CM | POA: Diagnosis not present

## 2024-03-27 LAB — CBC
HCT: 36.8 % — ABNORMAL LOW (ref 39.0–52.0)
Hemoglobin: 12.1 g/dL — ABNORMAL LOW (ref 13.0–17.0)
MCH: 29.2 pg (ref 26.0–34.0)
MCHC: 32.9 g/dL (ref 30.0–36.0)
MCV: 88.7 fL (ref 80.0–100.0)
Platelets: 286 K/uL (ref 150–400)
RBC: 4.15 MIL/uL — ABNORMAL LOW (ref 4.22–5.81)
RDW: 14.1 % (ref 11.5–15.5)
WBC: 16 K/uL — ABNORMAL HIGH (ref 4.0–10.5)
nRBC: 0 % (ref 0.0–0.2)

## 2024-03-27 LAB — MAGNESIUM: Magnesium: 2.3 mg/dL (ref 1.7–2.4)

## 2024-03-27 LAB — GLUCOSE, CAPILLARY
Glucose-Capillary: 174 mg/dL — ABNORMAL HIGH (ref 70–99)
Glucose-Capillary: 124 mg/dL — ABNORMAL HIGH (ref 70–99)

## 2024-03-27 LAB — BASIC METABOLIC PANEL WITH GFR
Anion gap: 10 (ref 5–15)
BUN: 23 mg/dL — ABNORMAL HIGH (ref 6–20)
CO2: 28 mmol/L (ref 22–32)
Calcium: 9.1 mg/dL (ref 8.9–10.3)
Chloride: 97 mmol/L — ABNORMAL LOW (ref 98–111)
Creatinine, Ser: 1.98 mg/dL — ABNORMAL HIGH (ref 0.61–1.24)
GFR, Estimated: 42 mL/min — ABNORMAL LOW
Glucose, Bld: 127 mg/dL — ABNORMAL HIGH (ref 70–99)
Potassium: 3.6 mmol/L (ref 3.5–5.1)
Sodium: 135 mmol/L (ref 135–145)

## 2024-03-27 LAB — COOXEMETRY PANEL
Carboxyhemoglobin: 2.7 % — ABNORMAL HIGH (ref 0.5–1.5)
Methemoglobin: <0.7 % (ref 0.0–1.5)
O2 Saturation: 68.4 %
Total hemoglobin: 12.4 g/dL (ref 12.0–16.0)

## 2024-03-27 LAB — HEPARIN LEVEL (UNFRACTIONATED): Heparin Unfractionated: 0.44 [IU]/mL (ref 0.30–0.70)

## 2024-03-27 MED ORDER — HYDRALAZINE HCL 100 MG PO TABS
100.0000 mg | ORAL_TABLET | Freq: Three times a day (TID) | ORAL | 0 refills | Status: AC
  Filled 2024-03-27: qty 90, 30d supply, fill #0

## 2024-03-27 MED ORDER — SPIRONOLACTONE 25 MG PO TABS
12.5000 mg | ORAL_TABLET | Freq: Every day | ORAL | 0 refills | Status: AC
  Filled 2024-03-27: qty 15, 30d supply, fill #0

## 2024-03-27 MED ORDER — SACUBITRIL-VALSARTAN 49-51 MG PO TABS: 1.0000 | ORAL_TABLET | Freq: Two times a day (BID) | ORAL | Status: DC

## 2024-03-27 MED ORDER — ATORVASTATIN CALCIUM 80 MG PO TABS
80.0000 mg | ORAL_TABLET | Freq: Every day | ORAL | 0 refills | Status: AC
  Filled 2024-03-27: qty 30, 30d supply, fill #0

## 2024-03-27 MED ORDER — APIXABAN 5 MG PO TABS
5.0000 mg | ORAL_TABLET | Freq: Two times a day (BID) | ORAL | 0 refills | Status: AC
  Filled 2024-03-27: qty 60, 30d supply, fill #0

## 2024-03-27 MED ORDER — POTASSIUM CHLORIDE CRYS ER 20 MEQ PO TBCR
20.0000 meq | EXTENDED_RELEASE_TABLET | Freq: Once | ORAL | Status: AC
  Administered 2024-03-27: 20 meq via ORAL
  Filled 2024-03-27: qty 1

## 2024-03-27 MED ORDER — FUROSEMIDE 40 MG PO TABS
40.0000 mg | ORAL_TABLET | Freq: Every day | ORAL | 0 refills | Status: AC
  Filled 2024-03-27: qty 30, 30d supply, fill #0

## 2024-03-27 MED ORDER — EMPAGLIFLOZIN 10 MG PO TABS
10.0000 mg | ORAL_TABLET | Freq: Every day | ORAL | 0 refills | Status: AC
  Filled 2024-03-27: qty 30, 30d supply, fill #0

## 2024-03-27 MED ORDER — APIXABAN 5 MG PO TABS
5.0000 mg | ORAL_TABLET | Freq: Two times a day (BID) | ORAL | Status: DC
  Administered 2024-03-27: 5 mg via ORAL
  Filled 2024-03-27: qty 1

## 2024-03-27 MED ORDER — ISOSORBIDE MONONITRATE ER 120 MG PO TB24
120.0000 mg | ORAL_TABLET | Freq: Every day | ORAL | 0 refills | Status: AC
  Filled 2024-03-27: qty 30, 30d supply, fill #0

## 2024-03-27 MED ORDER — SACUBITRIL-VALSARTAN 49-51 MG PO TABS
1.0000 | ORAL_TABLET | Freq: Two times a day (BID) | ORAL | 0 refills | Status: AC
  Filled 2024-03-27 (×3): qty 60, 30d supply, fill #0

## 2024-03-27 NOTE — Progress Notes (Signed)
 Patient refuse wheelchair and wanted to walk out the hospital on his own.

## 2024-03-27 NOTE — Plan of Care (Signed)
   Problem: Clinical Measurements: Goal: Ability to maintain clinical measurements within normal limits will improve Outcome: Progressing

## 2024-03-27 NOTE — Progress Notes (Signed)
 "    Advanced Heart Failure Rounding Note  Cardiologist: [name], MD  [facility] Cardiologist: Dr. Cherrie Chief Complaint: HTN Patient Profile   John Savage is a 43 y.o. male with history of uncontrolled HTN. Now admitted with hypertensive urgency and acute systolic heart failure.   Subjective:    Feels good. Wants to go home. BP improving.  No CP or SOB    Objective:    Weight Range: 68.4 kg Body mass index is 25.09 kg/m.   Vital Signs:   Temp:  [98.4 F (36.9 C)-99.9 F (37.7 C)] 98.4 F (36.9 C) (12/27 1140) Pulse Rate:  [61-125] 119 (12/27 1140) Resp:  [16-20] 16 (12/27 1140) BP: (117-156)/(74-113) 135/107 (12/27 1421) SpO2:  [92 %-98 %] 92 % (12/27 0832) Weight:  [68.4 kg] 68.4 kg (12/27 0340) Last BM Date : 03/26/24  Weight change: Filed Weights   03/25/24 0357 03/26/24 0321 03/27/24 0340  Weight: 69.2 kg 67.7 kg 68.4 kg   Intake/Output:  Intake/Output Summary (Last 24 hours) at 03/27/2024 1457 Last data filed at 03/27/2024 1400 Gross per 24 hour  Intake 870.75 ml  Output 1050 ml  Net -179.25 ml    Physical Exam   General:  Sitting up in bed. No resp difficulty HEENT: normal Neck: supple. no JVD.  Cor: Regular tachy . No rubs, gallops or murmurs. Lungs: clear Abdomen: soft, nontender, nondistended.Good bowel sounds. Extremities: no cyanosis, clubbing, rash, edema Neuro: alert & orientedx3, cranial nerves grossly intact. moves all 4 extremities w/o difficulty. Affect pleasant  Telemetry   ST 100-120 Personally reviewed  Labs    CBC Recent Labs    03/26/24 0558 03/27/24 0500  WBC 18.3* 16.0*  HGB 12.0* 12.1*  HCT 37.6* 36.8*  MCV 90.8 88.7  PLT 320 286   Basic Metabolic Panel Recent Labs    87/73/74 0558 03/27/24 0500 03/27/24 0816  NA 133*  --  135  K 3.4*  --  3.6  CL 93*  --  97*  CO2 28  --  28  GLUCOSE 214*  --  127*  BUN 22*  --  23*  CREATININE 2.05*  --  1.98*  CALCIUM  9.0  --  9.1  MG 1.7 2.3  --     Liver Function Tests No results for input(s): AST, ALT, ALKPHOS, BILITOT, PROT, ALBUMIN in the last 72 hours.  ProBNP (last 3 results) Recent Labs    03/21/24 1647  PROBNP 21,140.0*   Hemoglobin A1C No results for input(s): HGBA1C in the last 72 hours.  Fasting Lipid Panel No results for input(s): CHOL, HDL, LDLCALC, TRIG, CHOLHDL, LDLDIRECT in the last 72 hours.  Medications:    Scheduled Medications:  apixaban   5 mg Oral BID   atorvastatin   80 mg Oral Daily   Chlorhexidine  Gluconate Cloth  6 each Topical Daily   empagliflozin   10 mg Oral Daily   furosemide   40 mg Oral Daily   hydrALAZINE   100 mg Oral Q8H   insulin  aspart  0-5 Units Subcutaneous QHS   insulin  aspart  0-9 Units Subcutaneous TID WC   isosorbide  mononitrate  120 mg Oral Daily   ondansetron  (ZOFRAN ) IV  4 mg Intravenous Once   potassium chloride   20 mEq Oral Once   sacubitril -valsartan   1 tablet Oral BID   sodium chloride  flush  10-40 mL Intracatheter Q12H   spironolactone   12.5 mg Oral Daily    Infusions:    PRN Medications: acetaminophen  **OR** acetaminophen , ondansetron  **OR** ondansetron  (ZOFRAN ) IV, oxyCODONE ,  sodium chloride  flush  Assessment/Plan   Acute systolic heart failure - Echo 0/78 EF 50-55% - Echo 03/22/24 EF <20%, large mobile LV apical thrombus noted, LV with GHK, mild concentric LVH, GIIIDD, RV severely reduced, LA mod dilated, RA mildly dilated, mild MR - Need to r/o iCM. Otherwise suspect hypertensive CM vs tachy-mediated CM.  - NYHA IV on admission. - Continue lasix  40 mg daily - Continue jardiance  10 mg daily - Continue hydral 100 mg tid and imdur  120 mg daily - Increase Entresto  to 49/51 bid - Continue spiro 12.5 daily - avoid BB at this time with acute exacerbation - May need ivabradine - Not LHC cath candidate with CKD IV. With tachycardia consider RHC  - cMRI LVEF 32% RVEF 54% findings c/w hypertrophic CM + apical clot   Hypertensive  Urgency - SBP 200s on admission - Now improving   Atrial fibrillation/flutter - New this admission - On eliquis  for LV clot  Elevated trop - likely due to HF - LDL 131, continue statin - no chest pain - no cath with CKD and no objective evidence of ischemic heart disease   LV Thrombus - Continue eliqus   AKI  - SCr up to 2.5 on admission. Last baseline was around 1.1, 2 years ago - Renal US  2020 showed 1-59% B/L stenosis - sCr stable at 2.3 - Continue Entresto . Add SGLT2i as outpatient   Elevated LFTs - Suspect 2/2 congestion - Follow   Hypokalemia - Supp for goal K>4   D/w Dr. Arrien. Stable for d/c from HF perspective.    Length of Stay: 6  Toribio Fuel, MD  03/27/2024, 2:57 PM  Advanced Heart Failure Team Pager [phone] (M-F; 7a - 5p)   Please visit [email or web address]: For overnight coverage please call cardiology fellow first. If fellow not available call Shock/ECMO MD on call.  For ECMO / Mechanical Support (Impella, IABP, LVAD) issues call Shock / ECMO MD on call.   Patient seen and examined with the above-signed Advanced Practice Provider and/or Housestaff. I personally reviewed laboratory data, imaging studies and relevant notes. I independently examined the patient and formulated the important aspects of the plan. I have edited the note to reflect any of my changes or salient points. I have personally discussed the plan with the patient and/or family.  Feels good. Wants to go home  cMRI reviewed personally LVEF 32% RVEF 54% + evidence of HCM and apical clot  BP improving with current meds. Volume ok   General:  Sitting up in bed. No resp difficulty HEENT: normal Neck: supple. no JVD.  Cor: Regular rate & rhythm. No rubs, gallops or murmurs. Lungs: clear Abdomen: soft, nontender, nondistended.Good bowel sounds. Extremities: no cyanosis, clubbing, rash, edema Neuro: alert & orientedx3, cranial nerves grossly intact. moves all 4 extremities w/o difficulty.  Affect pleasant  Agree with med changes as above. cMRI reviewed.   Stable fro d.c from HF standpoint. We will arrang f/u.   TRH continuing to monitor in setting of fevers  Toribio Fuel, MD  2:57 PM   Had fever overnight  General:  Sitting up in bed. No resp difficulty HEENT: normal Neck: supple. no JVD.  Cor: Regular rate & rhythm. No rubs, gallops or murmurs. Lungs: clear Abdomen: soft, nontender, nondistended.Good bowel sounds. Extremities: no cyanosis, clubbing, rash, edema Neuro: alert & orientedx3, cranial nerves grossly intact. moves all 4 extremities w/o difficulty. Affect pleasant  "

## 2024-03-27 NOTE — Discharge Summary (Signed)
 " Physician Discharge Summary   Patient: John Savage MRN: [id number] DOB: 25-Oct-1980  Admit date:     03/21/2024  Discharge date: 03/27/2024  Discharge Physician: Elidia Sieving Georganne Siple   PCP: No primary care provider on file.   Recommendations at discharge:    Patient has been placed on heart failure guideline directed medical therapy with entresto , spironolactone , and SGLT 2 inh, afterload reduction with hydralazine  and isosorbide  Continue diuresis with furosemide  40 mg po daily Placed on anticoagulation with apixaban  for LV thrombus Follow up renal function and electrolytes in 7 days as outpatient Follow up with Primary Care in 7 to 10 days Follow up with Cardiology as scheduled   Discharge Diagnoses: Principal Problem:   Acute on chronic systolic CHF (congestive heart failure) (HCC) Active Problems:   Essential hypertension   Hyperlipidemia   Paroxysmal atrial fibrillation (HCC)   AKI (acute kidney injury)   Pre-diabetes   Leukocytosis  Resolved Problems:   * No resolved hospital problems. [facility] Course: John Savage was admitted to the hospital with the working diagnosis of heart failure exacerbation.   43 Savage with past medical history of hypertension who presented with dyspnea.  Reported several weeks of worsening lower extremity edema, along with dyspnea and intermittent chest pain. On the day of admission he was evaluated at urgent care, he was found with severe hypertension, 220/170 with HR 124 and was referred to the ED.  On his initial physical examination his blood pressure was 148/117, HR 102, RR 25 and 02 saturation 100%  Lungs with no wheezing or rhonchi, heart with S1 and S2 present and tachycardic, with no rubs or gallops, abdomen soft and not distended, positive lower extremity edema.    Na 130, K 3.0 Cl 101 bicarbonate 21 glucose 145 bun 33 cr 2,46  AST 65 ALT 90  High sensitive troponin 1,286  Wbc 9,6 hgb 13.7 plt 395  Respiratory viral  panel negative   Chest radiograph with cardiomegaly, bilateral hilar vascular congestion and bilateral cephalization of the vasculature. No effusions or infiltrates.   EKG 124 bpm, normal axis, normal intervals, qtc 436, sinus rhythm with poor RR wave progression, no significant ST segment or T wave changes, positive LVH.   Patient was placed  on IV furosemide  for diuresis and antihypertensive agents for blood pressure control.   Echocardiogram with reduced LV systolic function, with large LV thrombus.   12/25 improved volume status, but continue uncontrolled hypertension.  12/26 placed on entresto  and spironolactone   12/27 blood pressure and renal function more stable   Assessment and Plan: * Acute on chronic systolic CHF (congestive heart failure) (HCC) Echocardiogram with reduced LV systolic function with EF < 20%, global hypokinesis, mid LVH, grade III diastolic dysfunction with (restrictive pattern), RV systolic function with severe reduction, RVSP 41.9 mmHg, LA with moderate dilatation, RA with mild dilatation, mild mitral valve regurgitation, large mobile LV apical thrombus 1.5 x 2.2 cm.   12/26 cardiac MRI  Severe asymmetric hypertrophy of the basal infero septum up to 1,7 cm. EF 32 %, with global hypokinesis.  Normal RV and no significant valvular disease.  Apical LV thrombus 1.3 to 1.2 cm Non ischemic cardiomyopathy, hypertrophic.   Patient was placed on IV furosemide  for diuresis, negative fluid balance was achieved, -5,570 ml, with significant improvement in his symptoms.   Continue blood pressure control with entresto  (increased dose prior to his discharge), spironolactone , hydralazine  and isosorbide , Continue with SGLT 2 inh  Loop diuretic with furosemide  40 mg  po daily.  Limited therapy due to acutely reduced GFR.   LV thrombus, patient was placed on IV heparin  for anticoagulation, transitioned to oral apixaban  prior to his discharge.   Essential  hypertension Hypertensive emergency.  Resistant hypertension.   Continue blood pressure control with hydralazine , isosorbide , spironolactone  and entresto    Follow up on renin aldo levels.    Hyperlipidemia Continue with atorvastatin    Paroxysmal atrial fibrillation (HCC) Currently sinus rhythm, further anticoagulation with apixaban   AKI (acute kidney injury) Hypokalemia.   Today with renal function improving with serum cr at 1,98 with K at 3,6 and serum bicarbonate at 23 Na 135 and Mg 2.3   Continue diuresis with furosemide , SGLT 2 inh and spironolactone  Patient will have 20 meq kcl today prior to discharge Follow up renal function and electrolytes as outpatient    Pre-diabetes Hgb A1c at 6,3   Leukocytosis Reactive leukocytosis, no signs of bacterial infection Wbc is trending down to 16,0 today.  No antibiotic therapy indicated.        Consultants: cardiology  Procedures performed: none   Disposition: Home Diet recommendation:  Cardiac diet DISCHARGE MEDICATION: Allergies as of 03/27/2024       Reactions   Novocain [procaine] Other (See Comments)   Unknown reaction   Porcine (pork) Protein-containing Drug Products Other (See Comments)   Religious reason        Medication List     STOP taking these medications    acetaminophen  500 MG tablet Commonly known as: TYLENOL    amLODipine  10 MG tablet Commonly known as: NORVASC    amoxicillin  875 MG tablet Commonly known as: AMOXIL    carvedilol  25 MG tablet Commonly known as: COREG    olmesartan  20 MG tablet Commonly known as: BENICAR    ondansetron  4 MG disintegrating tablet Commonly known as: ZOFRAN -ODT   potassium chloride  SA 20 MEQ tablet Commonly known as: Klor-Con  M20       TAKE these medications    apixaban  5 MG Tabs tablet Commonly known as: ELIQUIS  Take 1 tablet (5 mg total) by mouth 2 (two) times daily.   atorvastatin  80 MG tablet Commonly known as: LIPITOR  Take 1 tablet (80 mg  total) by mouth daily. Start taking on: March 28, 2024 What changed:  medication strength how much to take   empagliflozin  10 MG Tabs tablet Commonly known as: JARDIANCE  Take 1 tablet (10 mg total) by mouth daily. Start taking on: March 28, 2024   furosemide  40 MG tablet Commonly known as: LASIX  Take 1 tablet (40 mg total) by mouth daily. Start taking on: March 28, 2024   hydrALAZINE  100 MG tablet Commonly known as: APRESOLINE  Take 1 tablet (100 mg total) by mouth every 8 (eight) hours. What changed:  medication strength how much to take when to take this   isosorbide  mononitrate 120 MG 24 hr tablet Commonly known as: IMDUR  Take 1 tablet (120 mg total) by mouth daily. Start taking on: March 28, 2024   sacubitril -valsartan  49-51 MG Commonly known as: ENTRESTO  Take 1 tablet by mouth 2 (two) times daily.   spironolactone  25 MG tablet Commonly known as: ALDACTONE  Take 0.5 tablets (12.5 mg total) by mouth daily. Start taking on: March 28, 2024        Follow-up Information     [facility]. Call in 1 week(s).   Why: please call to make you an apt a week after your dc date Contact information: [address] [place]  [address] [phone]  Discharge Exam: Filed Weights   03/25/24 0357 03/26/24 0321 03/27/24 0340  Weight: 69.2 kg 67.7 kg 68.4 kg   BP (!) 135/107   Pulse (!) 119   Temp 98.4 F (36.9 C) (Oral)   Resp 16   Ht 5' 5 (1.651 m)   Wt 68.4 kg   SpO2 92%   BMI 25.09 kg/m   Subjective: Patient with no chest pain and no dyspnea, no PND, orthopnea or lower extremity edema   Neurology awake and alert ENT with mild pallor with no icterus Cardiovascular with S1 and S2 present and regular with no gallops, rubs or murmurs Respiratory with no rales or wheezing, no rhonchi Abdomen with no distention, soft and non tender No lower extremity edema   Condition  at discharge: stable  The results of significant diagnostics from this hospitalization (including imaging, microbiology, ancillary and laboratory) are listed below for reference.   Imaging Studies: MR CARDIAC MORPHOLOGY W WO CONTRAST Result Date: 03/26/2024 CLINICAL DATA:  Cardiomyopathy, undefined, further testing EXAM: MR CARDIA MORPHOLOGY WITHOUT AND WITH CONTRAST; MR CARDIAC VELOCITY FLOW MAPPING TECHNIQUE: The patient was scanned on a 1.5 Tesla Siemens magnet. A dedicated cardiac coil was used. Functional imaging was done using TrueFisp sequences. 2,3, and 4 chamber views were done to assess for RWMA's. Modified Simpson's rule using a short axis stack was used to calculate an ejection fraction on a dedicated work [employer]. The patient received 10mL GADAVIST  GADOBUTROL  1 MMOL/ML IV SOLN. After 10 minutes inversion recovery sequences were used to assess for infiltration and scar tissue. Phase contrast velocity encoded images obtained x 2. This examination is tailored for evaluation cardiac anatomy and function and provides very limited assessment of noncardiac structures, which are accordingly not evaluated during interpretation. If there is clinical concern for extracardiac pathology, further evaluation with CT imaging should be considered. FINDINGS: LEFT VENTRICLE: Normal left ventricular size. Maximum septal wall thickness: 1.7 cm. Posterior wall thickness 1.1 cm. Left ventricular internal diameter (diastole): 4.8 cm. Global hypokinesis. Moderately reduced LV function. LV EF: 32% (Normal 49-79%) Absolute volumes: LV EDV: 153 mL (Normal 95-215 mL) LV ESV: 104 mL (Normal 25-85 mL) LV SV: 48 mL (Normal 61-145 mL) CO: 5.4 L/min (Normal 3.4-7.8 L/min) Indexed volumes: LV EDV: 87 mL/sq-m (Normal 50-108 mL/sq-m) LV ESV: 59 mL/sq-m (Normal 11-47 mL/sq-m) LV SV: 27 mL/sq-m (Normal 33-72 mL/sq-m) CI: 3.0 L/min/sq-m (Normal 1.8-4.2 L/min/sq-m) RIGHT VENTRICLE: Normal right ventricular chamber  size. Normal right ventricular wall thickness. Normal right ventricular systolic function. There are no regional wall motion abnormalities. RV EF: 54% (Normal 51-80%) Absolute volumes: RV EDV: 120 mL (Normal 109-217 mL) RV ESV: 55 mL (Normal 23-91 mL) RV SV: 65 mL (Normal 71-141 mL) CO: 7.2 L/min (Normal 2.8-8.8 L/min) Indexed volumes: RV EDV: 68 mL/sq-m (Normal 58-109 mL/sq-m) RV ESV: 33 mL/sq-m (Normal 12-46 mL/sq-m) RV SV: 37 mL/sq-m (Normal 38-71 mL/sq-m) CI: 4.1 L/min/sq-m (Normal 1.7-4.2 L/min/sq-m) Left atrium: Normal size. Right atrium: Normal size. Mitral valve: Normal mitral valve without regurgitation. Aortic valve: Tricuspid aortic valve with mild regurgitation (RF 12%). Tricuspid valve: Normal tricuspid valve without regurgitation. Pulmonic valve: Normal pulmonary valve with trivial regurgitation. Aorta: The aortic root and proximal ascending aorta are normal in diameter. Pulmonary artery: Dilated suggestive of pulmonary hypertension. Pericardium: The pericardium is normal thickness. There is no pericardial effusion. Systemic flow across the aortic valve (QS) was 5.2 L/min. Pulmonary flow across the pulmonic valve (QP) was 4.2 L/min. The QP:QS calculated across the aortic and pulmonic  valves was 0.9. Native myocardial T1-relaxation times were abnormal at 1111 ms. Native myocardial T2-relaxation times were normal at 47 ms. ECV was borderline at John%. Delayed Enhancement: There is delayed enhancement of the RV insertion sites. There is patchy mid-wall delayed enhancement of the basal inferoseptum in the area of asymmetric hypertrophy. There is a focal area of transmural delayed enhancement of the mid inferior wall consistent with infarction. There is epicardial delayed enhancement of the apical lateral segment (non-CAD scar). Delayed enhancement comprises 9.3% of the LV myocardium. There is evidence of an apical LV thrombus (1.3 cm x 1.2 cm). Extracardiac structures: No significant findings IMPRESSION: 1.  Severe asymmetric hypertrophy of the basal inferoseptum up to 1.7 cm. Systolic function was moderately reduced LVEF 32%. Global hypokinesis. 2. The right ventricle is normal in cavity size and systolic function. 3. No significant valvular heart disease. 4. There is delayed enhancement of the RV insertion sites, and patchy mid-wall enhancement of the basal inferoseptum (non-CAD). There is focal epicardial delayed enhancement of the apical lateral segment (non-CAD). Focal area of transmural myocardial infarction of the mid inferior wall. Delayed enhancement comprises 9.6% of the LV myocardium. 5. There is evidence of an apical LV thrombus (1.3 cm x 1.2 cm). 6. Findings are likely consistent with hypertrophic cardiomyopathy with predominantly non-CAD related scar. Small, focal area of inferior wall myocardial infarction. Global LV systolic function appears to be reduced due to predominantly nonischemic etiology. Electronically Signed   By: Darryle Decent M.D.   On: 03/26/2024 16:44   MR CARDIAC VELOCITY FLOW MAP Result Date: 03/26/2024 CLINICAL DATA:  Cardiomyopathy, undefined, further testing EXAM: MR CARDIA MORPHOLOGY WITHOUT AND WITH CONTRAST; MR CARDIAC VELOCITY FLOW MAPPING TECHNIQUE: The patient was scanned on a 1.5 Tesla Siemens magnet. A dedicated cardiac coil was used. Functional imaging was done using TrueFisp sequences. 2,3, and 4 chamber views were done to assess for RWMA's. Modified Simpson's rule using a short axis stack was used to calculate an ejection fraction on a dedicated work [employer]. The patient received 10mL GADAVIST  GADOBUTROL  1 MMOL/ML IV SOLN. After 10 minutes inversion recovery sequences were used to assess for infiltration and scar tissue. Phase contrast velocity encoded images obtained x 2. This examination is tailored for evaluation cardiac anatomy and function and provides very limited assessment of noncardiac structures, which are accordingly not evaluated  during interpretation. If there is clinical concern for extracardiac pathology, further evaluation with CT imaging should be considered. FINDINGS: LEFT VENTRICLE: Normal left ventricular size. Maximum septal wall thickness: 1.7 cm. Posterior wall thickness 1.1 cm. Left ventricular internal diameter (diastole): 4.8 cm. Global hypokinesis. Moderately reduced LV function. LV EF: 32% (Normal 49-79%) Absolute volumes: LV EDV: 153 mL (Normal 95-215 mL) LV ESV: 104 mL (Normal 25-85 mL) LV SV: 48 mL (Normal 61-145 mL) CO: 5.4 L/min (Normal 3.4-7.8 L/min) Indexed volumes: LV EDV: 87 mL/sq-m (Normal 50-108 mL/sq-m) LV ESV: 59 mL/sq-m (Normal 11-47 mL/sq-m) LV SV: 27 mL/sq-m (Normal 33-72 mL/sq-m) CI: 3.0 L/min/sq-m (Normal 1.8-4.2 L/min/sq-m) RIGHT VENTRICLE: Normal right ventricular chamber size. Normal right ventricular wall thickness. Normal right ventricular systolic function. There are no regional wall motion abnormalities. RV EF: 54% (Normal 51-80%) Absolute volumes: RV EDV: 120 mL (Normal 109-217 mL) RV ESV: 55 mL (Normal 23-91 mL) RV SV: 65 mL (Normal 71-141 mL) CO: 7.2 L/min (Normal 2.8-8.8 L/min) Indexed volumes: RV EDV: 68 mL/sq-m (Normal 58-109 mL/sq-m) RV ESV: 33 mL/sq-m (Normal 12-46 mL/sq-m) RV SV: 37 mL/sq-m (Normal 38-71 mL/sq-m) CI:  4.1 L/min/sq-m (Normal 1.7-4.2 L/min/sq-m) Left atrium: Normal size. Right atrium: Normal size. Mitral valve: Normal mitral valve without regurgitation. Aortic valve: Tricuspid aortic valve with mild regurgitation (RF 12%). Tricuspid valve: Normal tricuspid valve without regurgitation. Pulmonic valve: Normal pulmonary valve with trivial regurgitation. Aorta: The aortic root and proximal ascending aorta are normal in diameter. Pulmonary artery: Dilated suggestive of pulmonary hypertension. Pericardium: The pericardium is normal thickness. There is no pericardial effusion. Systemic flow across the aortic valve (QS) was 5.2 L/min. Pulmonary flow across the pulmonic valve (QP) was  4.2 L/min. The QP:QS calculated across the aortic and pulmonic valves was 0.9. Native myocardial T1-relaxation times were abnormal at 1111 ms. Native myocardial T2-relaxation times were normal at 47 ms. ECV was borderline at John%. Delayed Enhancement: There is delayed enhancement of the RV insertion sites. There is patchy mid-wall delayed enhancement of the basal inferoseptum in the area of asymmetric hypertrophy. There is a focal area of transmural delayed enhancement of the mid inferior wall consistent with infarction. There is epicardial delayed enhancement of the apical lateral segment (non-CAD scar). Delayed enhancement comprises 9.3% of the LV myocardium. There is evidence of an apical LV thrombus (1.3 cm x 1.2 cm). Extracardiac structures: No significant findings IMPRESSION: 1. Severe asymmetric hypertrophy of the basal inferoseptum up to 1.7 cm. Systolic function was moderately reduced LVEF 32%. Global hypokinesis. 2. The right ventricle is normal in cavity size and systolic function. 3. No significant valvular heart disease. 4. There is delayed enhancement of the RV insertion sites, and patchy mid-wall enhancement of the basal inferoseptum (non-CAD). There is focal epicardial delayed enhancement of the apical lateral segment (non-CAD). Focal area of transmural myocardial infarction of the mid inferior wall. Delayed enhancement comprises 9.6% of the LV myocardium. 5. There is evidence of an apical LV thrombus (1.3 cm x 1.2 cm). 6. Findings are likely consistent with hypertrophic cardiomyopathy with predominantly non-CAD related scar. Small, focal area of inferior wall myocardial infarction. Global LV systolic function appears to be reduced due to predominantly nonischemic etiology. Electronically Signed   By: Darryle Decent M.D.   On: 03/26/2024 16:44   MR CARDIAC VELOCITY FLOW MAP Result Date: 03/26/2024 CLINICAL DATA:  Cardiomyopathy, undefined, further testing EXAM: MR CARDIA MORPHOLOGY WITHOUT AND  WITH CONTRAST; MR CARDIAC VELOCITY FLOW MAPPING TECHNIQUE: The patient was scanned on a 1.5 Tesla Siemens magnet. A dedicated cardiac coil was used. Functional imaging was done using TrueFisp sequences. 2,3, and 4 chamber views were done to assess for RWMA's. Modified Simpson's rule using a short axis stack was used to calculate an ejection fraction on a dedicated work [employer]. The patient received 10mL GADAVIST  GADOBUTROL  1 MMOL/ML IV SOLN. After 10 minutes inversion recovery sequences were used to assess for infiltration and scar tissue. Phase contrast velocity encoded images obtained x 2. This examination is tailored for evaluation cardiac anatomy and function and provides very limited assessment of noncardiac structures, which are accordingly not evaluated during interpretation. If there is clinical concern for extracardiac pathology, further evaluation with CT imaging should be considered. FINDINGS: LEFT VENTRICLE: Normal left ventricular size. Maximum septal wall thickness: 1.7 cm. Posterior wall thickness 1.1 cm. Left ventricular internal diameter (diastole): 4.8 cm. Global hypokinesis. Moderately reduced LV function. LV EF: 32% (Normal 49-79%) Absolute volumes: LV EDV: 153 mL (Normal 95-215 mL) LV ESV: 104 mL (Normal 25-85 mL) LV SV: 48 mL (Normal 61-145 mL) CO: 5.4 L/min (Normal 3.4-7.8 L/min) Indexed volumes: LV EDV: 87 mL/sq-m (Normal 50-108  mL/sq-m) LV ESV: 59 mL/sq-m (Normal 11-47 mL/sq-m) LV SV: 27 mL/sq-m (Normal 33-72 mL/sq-m) CI: 3.0 L/min/sq-m (Normal 1.8-4.2 L/min/sq-m) RIGHT VENTRICLE: Normal right ventricular chamber size. Normal right ventricular wall thickness. Normal right ventricular systolic function. There are no regional wall motion abnormalities. RV EF: 54% (Normal 51-80%) Absolute volumes: RV EDV: 120 mL (Normal 109-217 mL) RV ESV: 55 mL (Normal 23-91 mL) RV SV: 65 mL (Normal 71-141 mL) CO: 7.2 L/min (Normal 2.8-8.8 L/min) Indexed volumes: RV EDV: 68 mL/sq-m  (Normal 58-109 mL/sq-m) RV ESV: 33 mL/sq-m (Normal 12-46 mL/sq-m) RV SV: 37 mL/sq-m (Normal 38-71 mL/sq-m) CI: 4.1 L/min/sq-m (Normal 1.7-4.2 L/min/sq-m) Left atrium: Normal size. Right atrium: Normal size. Mitral valve: Normal mitral valve without regurgitation. Aortic valve: Tricuspid aortic valve with mild regurgitation (RF 12%). Tricuspid valve: Normal tricuspid valve without regurgitation. Pulmonic valve: Normal pulmonary valve with trivial regurgitation. Aorta: The aortic root and proximal ascending aorta are normal in diameter. Pulmonary artery: Dilated suggestive of pulmonary hypertension. Pericardium: The pericardium is normal thickness. There is no pericardial effusion. Systemic flow across the aortic valve (QS) was 5.2 L/min. Pulmonary flow across the pulmonic valve (QP) was 4.2 L/min. The QP:QS calculated across the aortic and pulmonic valves was 0.9. Native myocardial T1-relaxation times were abnormal at 1111 ms. Native myocardial T2-relaxation times were normal at 47 ms. ECV was borderline at John%. Delayed Enhancement: There is delayed enhancement of the RV insertion sites. There is patchy mid-wall delayed enhancement of the basal inferoseptum in the area of asymmetric hypertrophy. There is a focal area of transmural delayed enhancement of the mid inferior wall consistent with infarction. There is epicardial delayed enhancement of the apical lateral segment (non-CAD scar). Delayed enhancement comprises 9.3% of the LV myocardium. There is evidence of an apical LV thrombus (1.3 cm x 1.2 cm). Extracardiac structures: No significant findings IMPRESSION: 1. Severe asymmetric hypertrophy of the basal inferoseptum up to 1.7 cm. Systolic function was moderately reduced LVEF 32%. Global hypokinesis. 2. The right ventricle is normal in cavity size and systolic function. 3. No significant valvular heart disease. 4. There is delayed enhancement of the RV insertion sites, and patchy mid-wall enhancement of the basal  inferoseptum (non-CAD). There is focal epicardial delayed enhancement of the apical lateral segment (non-CAD). Focal area of transmural myocardial infarction of the mid inferior wall. Delayed enhancement comprises 9.6% of the LV myocardium. 5. There is evidence of an apical LV thrombus (1.3 cm x 1.2 cm). 6. Findings are likely consistent with hypertrophic cardiomyopathy with predominantly non-CAD related scar. Small, focal area of inferior wall myocardial infarction. Global LV systolic function appears to be reduced due to predominantly nonischemic etiology. Electronically Signed   By: Darryle Decent M.D.   On: 03/26/2024 16:44   US  EKG SITE RITE Result Date: 03/23/2024 If Site Rite image not attached, placement could not be confirmed due to current cardiac rhythm.  ECHOCARDIOGRAM COMPLETE Result Date: 03/22/2024    ECHOCARDIOGRAM REPORT   Patient Name:   John Savage Date of Exam: 03/22/2024 Medical Rec #:  [id number]             Height:       65.0 in Accession #:    [id number]            Weight:       161.2 lb Date of Birth:  03-Aug-1980             BSA:          1.805 m Patient Age:  43 years              BP:           167/127 mmHg Patient Gender: M                     HR:           92 bpm. Exam Location:  Inpatient Procedure: 2D Echo, Color Doppler and Cardiac Doppler (Both Spectral and Color            Flow Doppler were utilized during procedure). Indications:    CHF I50.21  History:        Patient has prior history of Echocardiogram examinations, most                 recent 12/12/2019. Risk Factors:Hypertension.  Sonographer:    Nathanel Devonshire Referring Phys: [id number] ROBERT DORRELL IMPRESSIONS  1. Large mobile LV apical thrombus noted 1.5 x 2.2 cm. Left ventricular ejection fraction, by estimation, is <20%. The left ventricle has severely decreased function. The left ventricle demonstrates global hypokinesis. There is mild concentric left ventricular hypertrophy. Left ventricular diastolic  parameters are consistent with Grade III diastolic dysfunction (restrictive).  2. Right ventricular systolic function is severely reduced. The right ventricular size is normal. There is mildly elevated pulmonary artery systolic pressure. The estimated right ventricular systolic pressure is 42.9 mmHg.  3. Left atrial size was moderately dilated.  4. Right atrial size was mildly dilated.  5. The mitral valve is normal in structure. Mild mitral valve regurgitation. No evidence of mitral stenosis.  6. The aortic valve is tricuspid. Aortic valve regurgitation is mild. Aortic valve sclerosis/calcification is present, without any evidence of aortic stenosis.  7. The inferior vena cava is dilated in size with <50% respiratory variability, suggesting right atrial pressure of 15 mmHg. Conclusion(s)/Recommendation(s): Chart reviewed. Rounding team aware of LV thrombus. Patient on heparin . FINDINGS  Left Ventricle: Large mobile LV apical thrombus noted 1.5 x 2.2 cm. Left ventricular ejection fraction, by estimation, is <20%. The left ventricle has severely decreased function. The left ventricle demonstrates global hypokinesis. The left ventricular internal cavity size was normal in size. There is mild concentric left ventricular hypertrophy. Left ventricular diastolic parameters are consistent with Grade III diastolic dysfunction (restrictive). Right Ventricle: The right ventricular size is normal. No increase in right ventricular wall thickness. Right ventricular systolic function is severely reduced. There is mildly elevated pulmonary artery systolic pressure. The tricuspid regurgitant velocity is 2.64 m/s, and with an assumed right atrial pressure of 15 mmHg, the estimated right ventricular systolic pressure is 42.9 mmHg. Left Atrium: Left atrial size was moderately dilated. Right Atrium: Right atrial size was mildly dilated. Pericardium: There is no evidence of pericardial effusion. Mitral Valve: The mitral valve is normal  in structure. Mild mitral valve regurgitation. No evidence of mitral valve stenosis. Tricuspid Valve: The tricuspid valve is normal in structure. Tricuspid valve regurgitation is mild . No evidence of tricuspid stenosis. Aortic Valve: The aortic valve is tricuspid. Aortic valve regurgitation is mild. Aortic valve sclerosis/calcification is present, without any evidence of aortic stenosis. Aortic valve mean gradient measures 2.0 mmHg. Aortic valve peak gradient measures 3.9 mmHg. Aortic valve area, by VTI measures 1.87 cm. Pulmonic Valve: The pulmonic valve was normal in structure. Pulmonic valve regurgitation is not visualized. No evidence of pulmonic stenosis. Aorta: The aortic root is normal in size and structure. Venous: The inferior vena cava is dilated in size with less than 50%  respiratory variability, suggesting right atrial pressure of 15 mmHg. IAS/Shunts: No atrial level shunt detected by color flow Doppler.  LEFT VENTRICLE PLAX 2D LVIDd:         4.20 cm     Diastology LVIDs:         3.70 cm     LV e' medial:    4.57 cm/s LV PW:         1.20 cm     LV E/e' medial:  22.5 LV IVS:        1.30 cm     LV e' lateral:   6.85 cm/s LVOT diam:     1.80 cm     LV E/e' lateral: 15.0 LV SV:         27 LV SV Index:   15 LVOT Area:     2.54 cm LV IVRT:       90 msec  LV Volumes (MOD) LV vol d, MOD A2C: 74.4 ml LV vol d, MOD A4C: 80.2 ml LV vol s, MOD A2C: 62.0 ml LV vol s, MOD A4C: 66.0 ml LV SV MOD A2C:     12.4 ml LV SV MOD A4C:     80.2 ml LV SV MOD BP:      14.4 ml RIGHT VENTRICLE            IVC RV Basal diam:  3.30 cm    IVC diam: 1.90 cm RV S prime:     5.98 cm/s TAPSE (M-mode): 0.9 cm     PULMONARY VEINS                            Diastolic Velocity: 36.70 cm/s                            S/D Velocity:       0.70                            Systolic Velocity:  26.30 cm/s LEFT ATRIUM             Index        RIGHT ATRIUM           Index LA diam:        4.00 cm 2.22 cm/m   RA Area:     14.70 cm LA Vol (A2C):   55.5  ml 30.75 ml/m  RA Volume:   38.70 ml  21.44 ml/m LA Vol (A4C):   51.4 ml 28.48 ml/m LA Biplane Vol: 55.6 ml 30.81 ml/m  AORTIC VALVE AV Area (Vmax):    1.93 cm AV Area (Vmean):   1.98 cm AV Area (VTI):     1.87 cm AV Vmax:           98.30 cm/s AV Vmean:          69.800 cm/s AV VTI:            0.143 m AV Peak Grad:      3.9 mmHg AV Mean Grad:      2.0 mmHg LVOT Vmax:         74.60 cm/s LVOT Vmean:        54.400 cm/s LVOT VTI:          0.105 m LVOT/AV VTI ratio: 0.73  AORTA Ao Root diam: 3.50 cm Ao Asc diam:  3.50  cm MITRAL VALVE                TRICUSPID VALVE MV Area (PHT): 9.60 cm     TR Peak grad:   27.9 mmHg MV Decel Time: 79 msec      TR Vmax:        264.00 cm/s MV E velocity: 103.00 cm/s MV A velocity: 33.00 cm/s   SHUNTS MV E/A ratio:  3.12         Systemic VTI:  0.10 m                             Systemic Diam: 1.80 cm Toribio Fuel MD Electronically signed by Toribio Fuel MD Signature Date/Time: 03/22/2024/6:58:00 PM    Final    DG Chest 2 View Result Date: 03/21/2024 CLINICAL DATA:  Shortness of breath EXAM: CHEST - 2 VIEW COMPARISON:  12/11/2019 FINDINGS: Cardiomegaly with mild central congestion. No focal opacity, pleural effusion or pneumothorax. IMPRESSION: Cardiomegaly with mild central congestion. Electronically Signed   By: Luke Bun M.D.   On: 03/21/2024 17:36    Microbiology: Results for orders placed or performed during the hospital encounter of 03/21/24  Respiratory (~20 pathogens) panel by PCR     Status: None   Collection Time: 03/21/24 10:34 PM   Specimen: Nasopharyngeal Swab; Respiratory  Result Value Ref Range Status   Adenovirus NOT DETECTED NOT DETECTED Final   Coronavirus 229E NOT DETECTED NOT DETECTED Final    Comment: (NOTE) The Coronavirus on the Respiratory Panel, DOES NOT test for the novel  Coronavirus (2019 nCoV)    Coronavirus HKU1 NOT DETECTED NOT DETECTED Final   Coronavirus NL63 NOT DETECTED NOT DETECTED Final   Coronavirus OC43 NOT  DETECTED NOT DETECTED Final   Metapneumovirus NOT DETECTED NOT DETECTED Final   Rhinovirus / Enterovirus NOT DETECTED NOT DETECTED Final   Influenza A NOT DETECTED NOT DETECTED Final   Influenza B NOT DETECTED NOT DETECTED Final   Parainfluenza Virus 1 NOT DETECTED NOT DETECTED Final   Parainfluenza Virus 2 NOT DETECTED NOT DETECTED Final   Parainfluenza Virus 3 NOT DETECTED NOT DETECTED Final   Parainfluenza Virus 4 NOT DETECTED NOT DETECTED Final   Respiratory Syncytial Virus NOT DETECTED NOT DETECTED Final   Bordetella pertussis NOT DETECTED NOT DETECTED Final   Bordetella Parapertussis NOT DETECTED NOT DETECTED Final   Chlamydophila pneumoniae NOT DETECTED NOT DETECTED Final   Mycoplasma pneumoniae NOT DETECTED NOT DETECTED Final    Comment: Performed at [facility], [address] [place], [place] [address]    Labs: CBC: Recent Labs  Lab 03/23/24 [id number] 03/24/24 0536 03/25/24 0636 03/26/24 0558 03/27/24 0500  WBC 11.3* 13.0* 17.4* 18.3* 16.0*  HGB 13.3 13.8 13.0 12.0* 12.1*  HCT 40.4 42.9 39.9 37.6* 36.8*  MCV 88.6 90.7 89.9 90.8 88.7  PLT 388 384 374 320 286   Basic Metabolic Panel: Recent Labs  Lab 03/23/24 0357 03/23/24 1535 03/24/24 0536 03/24/24 1551 03/24/24 1657 03/25/24 0636 03/26/24 0558 03/27/24 0500 03/27/24 0816  NA 139   < > 139 137 140 137 133*  --  135  K 2.6*   < > 2.8* >7.5* 3.5 3.6 3.4*  --  3.6  CL 98   < > 94* 116* 98 95* 93*  --  97*  CO2 29   < > 33* 19* John John 28   --  28  GLUCOSE 149*   < >  181* 76 145* 221* 214*  --  127*  BUN 36*   < > 24* 16 25* 25* 22*  --  23*  CREATININE 2.38*   < > 2.29* 1.20 2.38* 2.35* 2.05*  --  1.98*  CALCIUM  8.6*   < > 9.2 4.4* 9.1 9.3 9.0  --  9.1  MG 2.0  --  2.2  --   --  2.0 1.7 2.3  --    < > = values in this interval not displayed.   Liver Function Tests: Recent Labs  Lab 03/21/24 1655  AST 65*  ALT 90*  ALKPHOS 117  BILITOT 0.8  PROT 6.1*  ALBUMIN 3.6   CBG: Recent Labs  Lab  03/26/24 1534 03/27/24 0616 03/27/24 1140  GLUCAP 133* 174* 124*    Discharge time spent: greater than 30 minutes.  Signed: Elidia Toribio Furnace, MD [facility] 03/27/2024 "

## 2024-03-27 NOTE — Progress Notes (Signed)
 Removed Unna boots per Cardiology before discharge.

## 2024-03-27 NOTE — Progress Notes (Signed)
 " Progress Note   Patient: John Savage FMW:[id number] DOB: 1980/04/06 DOA: 03/21/2024     6 DOS: the patient was seen and examined on 03/27/2024   Brief hospital course: John Savage was admitted to the hospital with the working diagnosis of heart failure exacerbation.   81 male with past medical history of hypertension who presented with dyspnea.  Reported several weeks of worsening lower extremity edema, along with dyspnea and intermittent chest pain. On the day of admission he was evaluated at urgent care, he was found with severe hypertension, 220/170 with HR 124 and was referred to the ED.  On his initial physical examination his blood pressure was 148/117, HR 102, RR 25 and 02 saturation 100%  Lungs with no wheezing or rhonchi, heart with S1 and S2 present and tachycardic, with no rubs or gallops, abdomen soft and not distended, positive lower extremity edema.    Na 130, K 3.0 Cl 101 bicarbonate 21 glucose 145 bun 33 cr 2,46  AST 65 ALT 90  High sensitive troponin 1,286  Wbc 9,6 hgb 13.7 plt 395  Respiratory viral panel negative   Chest radiograph with cardiomegaly, bilateral hilar vascular congestion and bilateral cephalization of the vasculature. No effusions or infiltrates.   EKG 124 bpm, normal axis, normal intervals, qtc 436, sinus rhythm with poor RR wave progression, no significant ST segment or T wave changes, positive LVH.   Patient was placed  on IV furosemide  for diuresis and antihypertensive agents for blood pressure control.   Echocardiogram with reduced LV systolic function, with large LV thrombus.   12/25 improved volume status, but continue uncontrolled hypertension.  12/26 placed on entresto  and spironolactone   12/27 blood pressure and renal function more stable   Assessment and Plan: * Acute on chronic systolic CHF (congestive heart failure) (HCC) Echocardiogram with reduced LV systolic function with EF < 20%, global hypokinesis, mid LVH, grade III  diastolic dysfunction with (restrictive pattern), RV systolic function with severe reduction, RVSP 41.9 mmHg, LA with moderate dilatation, RA with mild dilatation, mild mitral valve regurgitation, large mobile LV apical thrombus 1.5 x 2.2 cm.   12/26 cardiac MRI  Severe asymmetric hypertrophy of the basal infero septum up to 1,7 cm. EF 32 %, with global hypokinesis.  Normal RV and no significant valvular disease.  Apical LV thrombus 1.3 to 1.2 cm Non ischemic cardiomyopathy, hypertrophic.   Improved volume status.  Documented urine output 650 ml  Sv02 68.4  Continue blood pressure control with entresto , spironolactone , hydralazine  and isosorbide , Continue with SGLT 2 inh  Loop diuretic with furosemide  40 mg po daily.  Limited therapy due to acutely reduced GFR.   LV thrombus, continue anticoagulation with IV heparin , pending completion of cardiac evaluation, with cardiac catheterization    Essential hypertension Hypertensive emergency.  Resistant hypertension.   Continue blood pressure control with hydralazine , isosorbide , spironolactone  and entresto    Follow up on renin aldo levels.    Hyperlipidemia Continue with atorvastatin    Paroxysmal atrial fibrillation (HCC) Currently sinus rhythm, continue anticoagulation with IV heparin    AKI (acute kidney injury) Hypokalemia.   Today with renal function improving with serum cr at 1,98 with K at 3,6 and serum bicarbonate at 23 Na 135 and Mg 2.3   Continue diuresis with furosemide , SGLT 2 inh and spironolactone  Add 20 meq Kcl today   Pre-diabetes Hgb A1c at 6,3   Leukocytosis Reactive leukocytosis, no signs of bacterial infection Wbc is trending down to 16,0 today.  Continue to hold on antibiotic  therapy      Subjective: Patient with no chest pain and no dyspnea, no PND, orthopnea or lower extremity edema   Physical Exam: Vitals:   03/26/24 2340 03/27/24 0340 03/27/24 0832 03/27/24 1140  BP: 117/74 (!) 156/107 (!)  151/113 (!) 135/107  Pulse: (!) 125 (!) 124 (!) 116 (!) 119  Resp: 20 20 20 16   Temp: 99.9 F (37.7 C) 98.8 F (37.1 C) 98.9 F (37.2 C) 98.4 F (36.9 C)  TempSrc: Oral Oral Oral Oral  SpO2: 97% 96% 92%   Weight:  68.4 kg    Height:       Neurology awake and alert ENT with mild pallor with no icterus Cardiovascular with S1 and S2 present and regular with no gallops, rubs or murmurs Respiratory with no rales or wheezing, no rhonchi Abdomen with no distention, soft and non tender No lower extremity edema   Data Reviewed:    Family Communication: no family at the bedside   Disposition: Status is: Inpatient Remains inpatient appropriate because: blood pressure management   Planned Discharge Destination: Home     Author: Elidia Toribio Furnace, MD 03/27/2024 2:09 PM  For on call review [email or web address].  "

## 2024-03-27 NOTE — Progress Notes (Addendum)
 ANTICOAGULATION CONSULT NOTE  Pharmacy Consult for Heparin  Indication: LV Thrombus  Allergies[1]  Patient Measurements: Height: 5' 5 (165.1 cm) Weight: 68.4 kg (150 lb 12.7 oz) IBW/kg (Calculated) : 61.5 Heparin  Dosing Weight: 79.1 kg  Vital Signs: Temp: 98.8 F (37.1 C) (12/27 0340) Temp Source: Oral (12/27 0340) BP: 156/107 (12/27 0340) Pulse Rate: 124 (12/27 0340)  Labs: Recent Labs    03/24/24 1657 03/25/24 0636 03/25/24 0636 03/26/24 0558 03/26/24 1242 03/27/24 0500 03/27/24 0615  HGB  --  13.0   < > 12.0*  --  12.1*  --   HCT  --  39.9  --  37.6*  --  36.8*  --   PLT  --  374  --  320  --  286  --   HEPARINUNFRC  --  0.62  --   --  0.18*  --  0.44  CREATININE 2.38* 2.35*  --  2.05*  --   --   --    < > = values in this interval not displayed.    Estimated Creatinine Clearance: 40.8 mL/min (A) (by C-G formula based on SCr of 2.05 mg/dL (H)).   Medical History: Past Medical History:  Diagnosis Date   Hypertension    Assessment: 15 yom presenting with ankle/leg swelling and high blood pressure. Bedside US  by cardiology w/ LV Thrombus. Heparin  per pharmacy consult placed for LV thrombus. Patient is not on anticoagulation prior to arrival.  Heparin  level is therapeutic at 0.44, CBC stable.   Goal of Therapy:  Heparin  level 0.3-0.7 units/ml Monitor platelets by anticoagulation protocol: Yes   Plan:  Continue heparin  at 1100 units/h Repeat heparin  level with am labs  Donny Alert, PharmD, [facility] Clinical Pharmacist Please see AMION for all Pharmacists' Contact Phone Numbers 03/27/2024, 7:27 AM         [1]  Allergies Allergen Reactions   Novocain [Procaine] Other (See Comments)    Unknown reaction   Porcine (Pork) Protein-Containing Drug Products Other (See Comments)    Religious reason

## 2024-03-28 ENCOUNTER — Other Ambulatory Visit (HOSPITAL_COMMUNITY): Payer: Self-pay

## 2024-03-31 LAB — ALDOSTERONE + RENIN ACTIVITY W/ RATIO
ALDO / PRA Ratio: 0.6 (ref 0.0–30.0)
Aldosterone: 4.3 ng/dL (ref 0.0–30.0)
PRA LC/MS/MS: 6.949 ng/mL/h — ABNORMAL HIGH (ref 0.167–5.380)

## 2024-04-02 ENCOUNTER — Other Ambulatory Visit: Payer: Self-pay | Admitting: Cardiovascular Disease

## 2024-04-02 DIAGNOSIS — I10 Essential (primary) hypertension: Secondary | ICD-10-CM

## 2024-05-17 ENCOUNTER — Ambulatory Visit: Payer: Self-pay | Admitting: Nurse Practitioner
